# Patient Record
Sex: Male | Born: 1950 | Race: Asian | Hispanic: No | Marital: Married | Smoking: Former smoker
Health system: Southern US, Community
[De-identification: ages and names within clinical notes are randomized; demographics above are authoritative.]

## PROBLEM LIST (undated history)

## (undated) DIAGNOSIS — K219 Gastro-esophageal reflux disease without esophagitis: Secondary | ICD-10-CM

## (undated) DIAGNOSIS — L270 Generalized skin eruption due to drugs and medicaments taken internally: Secondary | ICD-10-CM

## (undated) DIAGNOSIS — K449 Diaphragmatic hernia without obstruction or gangrene: Secondary | ICD-10-CM

## (undated) DIAGNOSIS — D649 Anemia, unspecified: Secondary | ICD-10-CM

## (undated) DIAGNOSIS — B589 Toxoplasmosis, unspecified: Secondary | ICD-10-CM

## (undated) DIAGNOSIS — D7212 Drug rash with eosinophilia and systemic symptoms syndrome: Secondary | ICD-10-CM

## (undated) DIAGNOSIS — I5022 Chronic systolic (congestive) heart failure: Secondary | ICD-10-CM

## (undated) DIAGNOSIS — I2510 Atherosclerotic heart disease of native coronary artery without angina pectoris: Secondary | ICD-10-CM

## (undated) DIAGNOSIS — I10 Essential (primary) hypertension: Secondary | ICD-10-CM

## (undated) DIAGNOSIS — D721 Eosinophilia: Secondary | ICD-10-CM

## (undated) DIAGNOSIS — T50905A Adverse effect of unspecified drugs, medicaments and biological substances, initial encounter: Secondary | ICD-10-CM

## (undated) DIAGNOSIS — R911 Solitary pulmonary nodule: Secondary | ICD-10-CM

## (undated) HISTORY — DX: Adverse effect of unspecified drugs, medicaments and biological substances, initial encounter: T50.905A

## (undated) HISTORY — DX: Anemia, unspecified: D64.9

## (undated) HISTORY — DX: Eosinophilia: D72.1

## (undated) HISTORY — DX: Diaphragmatic hernia without obstruction or gangrene: K44.9

## (undated) HISTORY — DX: Drug rash with eosinophilia and systemic symptoms syndrome: D72.12

## (undated) HISTORY — DX: Generalized skin eruption due to drugs and medicaments taken internally: L27.0

## (undated) HISTORY — DX: Gastro-esophageal reflux disease without esophagitis: K21.9

## (undated) HISTORY — DX: Toxoplasmosis, unspecified: B58.9

## (undated) HISTORY — DX: Essential (primary) hypertension: I10

## (undated) HISTORY — PX: NO PAST SURGERIES: SHX2092

## (undated) HISTORY — DX: Solitary pulmonary nodule: R91.1

---

## 2011-11-21 ENCOUNTER — Ambulatory Visit (INDEPENDENT_AMBULATORY_CARE_PROVIDER_SITE_OTHER): Payer: BC Managed Care – PPO

## 2011-11-21 DIAGNOSIS — Z0000 Encounter for general adult medical examination without abnormal findings: Secondary | ICD-10-CM

## 2014-10-29 ENCOUNTER — Other Ambulatory Visit: Payer: Self-pay | Admitting: Family Medicine

## 2014-10-29 ENCOUNTER — Ambulatory Visit (INDEPENDENT_AMBULATORY_CARE_PROVIDER_SITE_OTHER): Payer: 59 | Admitting: Family Medicine

## 2014-10-29 VITALS — BP 138/84 | HR 66 | Temp 97.8°F | Resp 18 | Ht 67.0 in | Wt 204.4 lb

## 2014-10-29 DIAGNOSIS — Z13220 Encounter for screening for lipoid disorders: Secondary | ICD-10-CM

## 2014-10-29 DIAGNOSIS — Z125 Encounter for screening for malignant neoplasm of prostate: Secondary | ICD-10-CM

## 2014-10-29 DIAGNOSIS — Z23 Encounter for immunization: Secondary | ICD-10-CM

## 2014-10-29 DIAGNOSIS — Z0189 Encounter for other specified special examinations: Secondary | ICD-10-CM

## 2014-10-29 DIAGNOSIS — Z130 Encounter for screening for diseases of the blood and blood-forming organs and certain disorders involving the immune mechanism: Secondary | ICD-10-CM

## 2014-10-29 DIAGNOSIS — Z131 Encounter for screening for diabetes mellitus: Secondary | ICD-10-CM

## 2014-10-29 DIAGNOSIS — D509 Iron deficiency anemia, unspecified: Secondary | ICD-10-CM

## 2014-10-29 NOTE — Patient Instructions (Signed)
I will be in touch with your labs asap- I will send you a letter You got a flu shot and tetanus shot today Please see us for a complete physical sometime this year, and please schedule a colonoscopy with Dr. Marina GoodellPerry when you can  Good to meet you today!

## 2014-10-29 NOTE — Progress Notes (Signed)
[facility] [address], [place] [address] [phone]  Date:  10/29/2014   Name:  Martin JulianGerald Weinhold   DOB:  13-Feb-1951   MRN:  [id number]  PCP:  No primary care provider on file.    Chief Complaint: Establish Care   History of Present Illness:  Martin Mcdowell is a 64 y.o. very pleasant male patient who presents with the following:  He is here to establish care today.  He is generally in good health He has not yet had a colonoscopy.  His wife is established with Dr. Gwyneth SproutPerrry No recent BW, he needs a flu shot also He is not fasting today  There are no active problems to display for this patient.   History reviewed. No pertinent past medical history.  History reviewed. No pertinent past surgical history.  History  Substance Use Topics  . Smoking status: Never Smoker   . Smokeless tobacco: Not on file  . Alcohol Use: 1.2 - 1.8 oz/week    2-3 Not specified per week    History reviewed. No pertinent family history.  No Known Allergies  Medication list has been reviewed and updated.  No current outpatient prescriptions on file prior to visit.   No current facility-administered medications on file prior to visit.    Review of Systems:  As per HPI- otherwise negative.   Physical Examination: Filed Vitals:   10/29/14 1140  BP: 138/84  Pulse: 66  Temp: 97.8 F (36.6 C)  Resp: 18   Filed Vitals:   10/29/14 1140  Height: 5\' 7"  (1.702 m)  Weight: 204 lb 6.4 oz (92.715 kg)   Body mass index is 32.01 kg/(m^2). Ideal Body Weight: Weight in (lb) to have BMI = 25: 159.3  GEN: WDWN, NAD, Non-toxic, A & O x 3, overweight/ muscular build.  Looks well HEENT: Atraumatic, Normocephalic. Neck supple. No masses, No LAD. Ears and Nose: No external deformity. CV: RRR, No M/G/R. No JVD. No thrill. No extra heart sounds. PULM: CTA B, no wheezes, crackles, rhonchi. No retractions. No resp. distress. No accessory muscle use. EXTR: No c/c/e NEURO Normal  gait.  PSYCH: Normally interactive. Conversant. Not depressed or anxious appearing.  Calm demeanor.    Assessment and Plan: Screening for hyperlipidemia - Plan: Lipid panel  Immunization due - Plan: Flu Vaccine QUAD 36+ mos IM, Tdap vaccine greater than or equal to 7yo IM  Screening for prostate cancer - Plan: PSA  Screening for deficiency anemia - Plan: CBC  Screening for diabetes mellitus - Plan: Comprehensive metabolic panel  Screening labs and flu/ tdap as above.   Will be in touch with him pending his lab results.    Signed Abbe AmsterdamJessica Copland, MD

## 2014-10-30 LAB — LIPID PANEL
Cholesterol: 219 mg/dL — ABNORMAL HIGH (ref 0–200)
HDL: 38 mg/dL — ABNORMAL LOW (ref >39)
LDL Cholesterol: 158 mg/dL — ABNORMAL HIGH (ref 0–99)
Total CHOL/HDL Ratio: 5.8 Ratio
Triglycerides: 117 mg/dL (ref <150)
VLDL: 23 mg/dL (ref 0–40)

## 2014-10-30 LAB — COMPREHENSIVE METABOLIC PANEL
ALT: 24 U/L (ref 0–53)
AST: 19 U/L (ref 0–37)
Albumin: 3.9 g/dL (ref 3.5–5.2)
Alkaline Phosphatase: 54 U/L (ref 39–117)
BUN: 19 mg/dL (ref 6–23)
CO2: 27 mEq/L (ref 19–32)
Calcium: 9 mg/dL (ref 8.4–10.5)
Chloride: 109 mEq/L (ref 96–112)
Creat: 0.79 mg/dL (ref 0.50–1.35)
Glucose, Bld: 96 mg/dL (ref 70–99)
Potassium: 4.7 mEq/L (ref 3.5–5.3)
Sodium: 141 mEq/L (ref 135–145)
Total Bilirubin: 0.9 mg/dL (ref 0.2–1.2)
Total Protein: 6.7 g/dL (ref 6.0–8.3)

## 2014-10-30 LAB — CBC
HCT: 38.5 % — ABNORMAL LOW (ref 39.0–52.0)
Hemoglobin: 12.3 g/dL — ABNORMAL LOW (ref 13.0–17.0)
MCH: 20.3 pg — ABNORMAL LOW (ref 26.0–34.0)
MCHC: 31.9 g/dL (ref 30.0–36.0)
MCV: 63.5 fL — ABNORMAL LOW (ref 78.0–100.0)
MPV: 9.1 fL (ref 8.6–12.4)
Platelets: 279 10*3/uL (ref 150–400)
RBC: 6.06 MIL/uL — ABNORMAL HIGH (ref 4.22–5.81)
RDW: 17.1 % — ABNORMAL HIGH (ref 11.5–15.5)
WBC: 6.9 10*3/uL (ref 4.0–10.5)

## 2014-10-30 LAB — PSA: PSA: 1.18 ng/mL (ref <=4.00)

## 2014-11-02 ENCOUNTER — Encounter: Payer: Self-pay | Admitting: Family Medicine

## 2014-11-03 ENCOUNTER — Encounter: Payer: Self-pay | Admitting: Family Medicine

## 2014-11-03 LAB — FERRITIN: Ferritin: 124 ng/mL (ref 22–322)

## 2016-10-10 ENCOUNTER — Ambulatory Visit (INDEPENDENT_AMBULATORY_CARE_PROVIDER_SITE_OTHER): Payer: Self-pay | Admitting: Physician Assistant

## 2016-10-10 VITALS — BP 136/88 | HR 74 | Temp 97.9°F | Resp 16 | Ht 67.0 in | Wt 199.0 lb

## 2016-10-10 DIAGNOSIS — Z0289 Encounter for other administrative examinations: Secondary | ICD-10-CM

## 2016-10-10 NOTE — Progress Notes (Signed)
[facility] [address], [place] [address] [phone]  Date:  10/10/2016   Name:  Martin Mcdowell   DOB:  [birth year]   MRN:  [id number]  PCP:  No primary care provider on file.    History of Present Illness:  Martin Mcdowell is a 65 y.o. male patient [facility]. No concerns at this time. Ros listed below.  Vitamins without any prescription medications.      There are no active problems to display for this patient.   No past medical history on file.  No past surgical history on file.  Social History  Substance Use Topics  . Smoking status: Never Smoker  . Smokeless tobacco: Never Used  . Alcohol use 1.2 - 1.8 oz/week    2 - 3 Standard drinks or equivalent per week    No family history on file.  No Known Allergies  Medication list has been reviewed and updated.  No current outpatient prescriptions on file prior to visit.   No current facility-administered medications on file prior to visit.     Review of Systems  Constitutional: Negative for chills and fever.  HENT: Negative for ear discharge, ear pain and sore throat.   Eyes: Negative for blurred vision and double vision.  Respiratory: Negative for cough, shortness of breath and wheezing.   Cardiovascular: Negative for chest pain, palpitations and leg swelling.  Gastrointestinal: Negative for diarrhea, nausea and vomiting.  Genitourinary: Negative for dysuria, frequency and hematuria.  Skin: Negative for itching and rash.  Neurological: Negative for dizziness and headaches.   Physical Examination: BP 136/88 (BP Location: Left Arm, Patient Position: Sitting, Cuff Size: Normal)   Pulse 74   Temp 97.9 F (36.6 C) (Oral)   Resp 16   Ht 5\' 7"  (1.702 m)   Wt 199 lb (90.3 kg)   SpO2 97%   BMI 31.17 kg/m  Ideal Body Weight: Weight in (lb) to have BMI = 25: 159.3  Physical Exam  Constitutional: He is oriented to person, place, and time. He appears well-developed and  well-nourished. No distress.  HENT:  Head: Normocephalic and atraumatic.  Right Ear: Tympanic membrane, external ear and ear canal normal.  Left Ear: Tympanic membrane, external ear and ear canal normal.  Eyes: Conjunctivae and EOM are normal. Pupils are equal, round, and reactive to light.  Cardiovascular: Normal rate and regular rhythm.  Exam reveals no friction rub.   No murmur heard. Pulmonary/Chest: Effort normal. No respiratory distress. He has no wheezes.  Abdominal: Soft. Bowel sounds are normal. He exhibits no distension and no mass. There is no tenderness. Hernia confirmed negative in the right inguinal area and confirmed negative in the left inguinal area.  Musculoskeletal: Normal range of motion. He exhibits no edema or tenderness.  Neurological: He is alert and oriented to person, place, and time. He displays normal reflexes.  Skin: Skin is warm and dry. He is not diaphoretic.  Psychiatric: He has a normal mood and affect. His behavior is normal.     Assessment and Plan: Martin Mcdowell is a 65 y.o. male who is here today for dot exam. Normal without concerns.  2 year certification given. 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

## 2018-08-03 ENCOUNTER — Ambulatory Visit: Payer: Self-pay | Admitting: *Deleted

## 2018-08-03 NOTE — Telephone Encounter (Signed)
Patient's wife is calling to report that her husband has been having chest pain for over a month- the chest pain comes and goes. 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 Per protocol- advised ED for symptoms- but patient declines ED visit and wants to schedule appointment when he returns to town. Patient has NP appt at [facility] office- but needs sooner appointment- NP appointment moved( per [place]- they can not see him acute or move appointment up)to [place]. Strong precautions given to patient and wife if patient symptoms should get worse.   Reason for Disposition . [1] Chest pain lasts > 5 minutes AND [2] age > 17  Answer Assessment - Initial Assessment Questions 1. LOCATION: "Where does it hurt?"       To the left of strunum 2. RADIATION: "Does the pain go anywhere else?" (e.g., into neck, jaw, arms, back)     Patient has pains in all parts-no- not at this time 3. ONSET: "When did the chest pain begin?" (Minutes, hours or days)      1 hour ago 4. PATTERN "Does the pain come and go, or has it been constant since it started?"  "Does it get worse with exertion?"      Constant--subsided now, patient has never noticed it 5. DURATION: "How long does it last" (e.g., seconds, minutes, hours)     1 hour 6. SEVERITY: "How bad is the pain?"  (e.g., Scale 1-10; mild, moderate, or severe)    - MILD (1-3): doesn't interfere with normal activities     - MODERATE (4-7): interferes with normal activities or awakens from sleep    - SEVERE (8-10): excruciating pain, unable to do any normal activities       3-4 7. CARDIAC RISK FACTORS: "Do you have any history of heart problems or risk factors for heart disease?" (e.g., prior heart attack, angina; high blood pressure, diabetes, being overweight, high cholesterol, smoking, or strong family history of heart disease)     No- not as far as he knows 8. PULMONARY RISK FACTORS: "Do you have any history of lung disease?"   (e.g., blood clots in lung, asthma, emphysema, birth control pills)     no 9. CAUSE: "What do you think is causing the chest pain?"     No idea 10. OTHER SYMPTOMS: "Do you have any other symptoms?" (e.g., dizziness, nausea, vomiting, sweating, fever, difficulty breathing, cough)       Sweating, fever  11. PREGNANCY: "Is there any chance you are pregnant?" "When was your last menstrual period?"       n/a  Protocols used: CHEST PAIN-A-AH

## 2018-08-07 ENCOUNTER — Ambulatory Visit (INDEPENDENT_AMBULATORY_CARE_PROVIDER_SITE_OTHER): Payer: Medicare Other

## 2018-08-07 ENCOUNTER — Encounter: Payer: Self-pay | Admitting: Family Medicine

## 2018-08-07 ENCOUNTER — Ambulatory Visit (INDEPENDENT_AMBULATORY_CARE_PROVIDER_SITE_OTHER): Payer: Medicare Other | Admitting: Family Medicine

## 2018-08-07 VITALS — BP 126/70 | HR 79 | Temp 98.2°F | Ht 67.0 in | Wt 185.4 lb

## 2018-08-07 DIAGNOSIS — R05 Cough: Secondary | ICD-10-CM

## 2018-08-07 DIAGNOSIS — R059 Cough, unspecified: Secondary | ICD-10-CM

## 2018-08-07 DIAGNOSIS — R0789 Other chest pain: Secondary | ICD-10-CM

## 2018-08-07 DIAGNOSIS — R5383 Other fatigue: Secondary | ICD-10-CM

## 2018-08-07 DIAGNOSIS — Z23 Encounter for immunization: Secondary | ICD-10-CM

## 2018-08-07 DIAGNOSIS — Z1211 Encounter for screening for malignant neoplasm of colon: Secondary | ICD-10-CM

## 2018-08-07 DIAGNOSIS — N521 Erectile dysfunction due to diseases classified elsewhere: Secondary | ICD-10-CM

## 2018-08-07 LAB — LIPID PANEL
Cholesterol: 193 mg/dL (ref 0–200)
HDL: 22.6 mg/dL — ABNORMAL LOW (ref >39.00)
LDL Cholesterol: 141 mg/dL — ABNORMAL HIGH (ref 0–99)
NonHDL: 170.13
Total CHOL/HDL Ratio: 9
Triglycerides: 148 mg/dL (ref 0.0–149.0)
VLDL: 29.6 mg/dL (ref 0.0–40.0)

## 2018-08-07 LAB — CBC
HCT: 37.1 % — ABNORMAL LOW (ref 39.0–52.0)
Hemoglobin: 11.7 g/dL — ABNORMAL LOW (ref 13.0–17.0)
MCHC: 31.6 g/dL (ref 30.0–36.0)
MCV: 60.4 fl — ABNORMAL LOW (ref 78.0–100.0)
Platelets: 293 10*3/uL (ref 150.0–400.0)
RBC: 6.12 Mil/uL — ABNORMAL HIGH (ref 4.22–5.81)
RDW: 15.4 % (ref 11.5–15.5)
WBC: 5.5 10*3/uL (ref 4.0–10.5)

## 2018-08-07 LAB — COMPREHENSIVE METABOLIC PANEL
ALT: 25 U/L (ref 0–53)
AST: 22 U/L (ref 0–37)
Albumin: 3.4 g/dL — ABNORMAL LOW (ref 3.5–5.2)
Alkaline Phosphatase: 51 U/L (ref 39–117)
BUN: 16 mg/dL (ref 6–23)
CO2: 28 mEq/L (ref 19–32)
Calcium: 8.6 mg/dL (ref 8.4–10.5)
Chloride: 102 mEq/L (ref 96–112)
Creatinine, Ser: 0.93 mg/dL (ref 0.40–1.50)
GFR: 86.45 mL/min (ref >60.00)
Glucose, Bld: 96 mg/dL (ref 70–99)
Potassium: 4.3 mEq/L (ref 3.5–5.1)
Sodium: 136 mEq/L (ref 135–145)
Total Bilirubin: 0.7 mg/dL (ref 0.2–1.2)
Total Protein: 6.3 g/dL (ref 6.0–8.3)

## 2018-08-07 LAB — TSH: TSH: 1.85 u[IU]/mL (ref 0.35–4.50)

## 2018-08-07 LAB — TESTOSTERONE: Testosterone: 157.13 ng/dL — ABNORMAL LOW (ref 300.00–890.00)

## 2018-08-07 NOTE — Assessment & Plan Note (Signed)
Orders Placed This Encounter  Procedures  . DG Chest 2 View    Standing Status:   Future    Number of Occurrences:   1    Standing Expiration Date:   10/08/2019    Order Specific Question:   Reason for Exam (SYMPTOM  OR DIAGNOSIS REQUIRED)    Answer:   cough, fatigue, remote smoking history    Order Specific Question:   Preferred imaging location?    Answer:   Internal    Order Specific Question:   Radiology Contrast Protocol - do NOT remove file path    Answer:   _0 [email or web address]\DXFluoroContrastProtocols.pdf  . Pneumococcal conjugate vaccine 13-valent  . Flu vaccine HIGH DOSE PF (Fluzone High dose)  . Comp Met (CMET)  . CBC  . Lipid Profile  . TSH  . Cologuard  . Testosterone  . EKG 12-Lead   Labs ordered today including testosterone given decreased libido.

## 2018-08-07 NOTE — Assessment & Plan Note (Signed)
-  Discussed options for colon cancer screening. -He elects to have cologuard, ordered.

## 2018-08-07 NOTE — Progress Notes (Signed)
Martin Mcdowell - 67 y.o. male MRN [id number]  Date of birth: Jun 30, 1951  Subjective Chief Complaint  Patient presents with  . Chest Pain  . Gastroesophageal Reflux  . Night Sweats    HPI Martin Mcdowell is a 67 y.o. with no significant past medical problems here today to establish care and has concern about intermittent chest pain.   -Chest pain:  Reports that for the past 4-6 months he has had intermittent episodes of central chest discomfort.  Describes as someone "poking their finger in my chest".  Sensation lasts for maybe a couple of minutes.  Denies association with exertion.  This is not associated with any other symptoms including shortness of breath, sweating, nausea or vomiting, dizziness or palpitations.  He does admit to some occasional GERD, worsened with EtOH or certain foods.  Reports chronic swelling in L leg since he was a kid, no worsening of this.  He also has a remote history of smoking, quit in 1990.  Wife reports chronic cough. Had what sounds like viral illness last week with headache, fever/sweats and myalgias which has resolved at this point.   -Fatigue:  Reports increased fatigue as well as decrease in libido.  Wife also reports about 20lb weight loss over the past several months.  He states that they did just purchase a new home and have been doing some increased yard work around there and they have made significant changes to their diet as well.  He is not up to date on colon cancer screening.   ROS:  A comprehensive ROS was completed and negative except as noted per HPI  No Known Allergies  History reviewed. No pertinent past medical history.  History reviewed. No pertinent surgical history.  Social History   Socioeconomic History  . Marital status: Married    Spouse name: Not on file  . Number of children: Not on file  . Years of education: Not on file  . Highest education level: Not on file  Occupational History  . Not on file  Social Needs  . Financial  resource strain: Not on file  . Food insecurity:    Worry: Not on file    Inability: Not on file  . Transportation needs:    Medical: Not on file    Non-medical: Not on file  Tobacco Use  . Smoking status: Former Smoker    Types: Cigarettes  . Smokeless tobacco: Never Used  Substance and Sexual Activity  . Alcohol use: Yes    Alcohol/week: 2.0 - 3.0 standard drinks    Types: 2 - 3 Standard drinks or equivalent per week  . Drug use: No  . Sexual activity: Not on file  Lifestyle  . Physical activity:    Days per week: Not on file    Minutes per session: Not on file  . Stress: Not on file  Relationships  . Social connections:    Talks on phone: Not on file    Gets together: Not on file    Attends religious service: Not on file    Active member of club or organization: Not on file    Attends meetings of clubs or organizations: Not on file    Relationship status: Not on file  Other Topics Concern  . Not on file  Social History Narrative  . Not on file    History reviewed. No pertinent family history.  Health Maintenance  Topic Date Due  . COLONOSCOPY  10/13/2002  . PNA vac Low Risk Adult (  1 of 2 - PCV13) 10/13/2017  . INFLUENZA VACCINE  05/24/2018  . Hepatitis C Screening  08/08/2019 (Originally 05-Jul-1951)  . HIV Screening  08/08/2019 (Originally 10/14/1967)  . TETANUS/TDAP  10/29/2024    ----------------------------------------------------------------------------------------------------------------------------------------------------------------------------------------------------------------- Physical Exam BP 126/70   Pulse 79   Temp 98.2 F (36.8 C) (Oral)   Ht _0  (1.702 m)   Wt 185 lb 6.4 oz (84.1 kg)   SpO2 98%   BMI 29.04 kg/m   Physical Exam  Constitutional: He is oriented to person, place, and time. He appears well-nourished. No distress.  HENT:  Head: Normocephalic and atraumatic.  Mouth/Throat: Oropharynx is clear and moist.  Eyes: No scleral  icterus.  Neck: Neck supple. No thyromegaly present.  Cardiovascular: Normal rate, regular rhythm and normal heart sounds.  Pulmonary/Chest: Effort normal and breath sounds normal.  Abdominal: Soft. Bowel sounds are normal. He exhibits no distension. There is no tenderness.  Musculoskeletal: He exhibits edema (1+edema LLE).  Lymphadenopathy:    He has no cervical adenopathy.  Neurological: He is alert and oriented to person, place, and time.  Skin: Skin is warm and dry. No rash noted.  Psychiatric: He has a normal mood and affect. His behavior is normal.    ------------------------------------------------------------------------------------------------------------------------------------------------------------------------------------------------------------------- Assessment and Plan  Screening for colon cancer -Discussed options for colon cancer screening. -He elects to have cologuard, ordered.   Fatigue Orders Placed This Encounter  Procedures  . DG Chest 2 View    Standing Status:   Future    Number of Occurrences:   1    Standing Expiration Date:   10/08/2019    Order Specific Question:   Reason for Exam (SYMPTOM  OR DIAGNOSIS REQUIRED)    Answer:   cough, fatigue, remote smoking history    Order Specific Question:   Preferred imaging location?    Answer:   Internal    Order Specific Question:   Radiology Contrast Protocol - do NOT remove file path    Answer:   \\[email or web address]\Radiant\DXFluoroContrastProtocols.pdf  . Pneumococcal conjugate vaccine 13-valent  . Flu vaccine HIGH DOSE PF (Fluzone High dose)  . Comp Met (CMET)  . CBC  . Lipid Profile  . TSH  . Cologuard  . Testosterone  . EKG 12-Lead   Labs ordered today including testosterone given decreased libido.    Cough -CXR ordered for chronic cough, fatigue and weight loss.    Chest discomfort EKG completed and normal May be related to GERD or MSK.  -Discussed if occurs more frequently to let me  know. -Check labs to identify risk factors for CVD.

## 2018-08-07 NOTE — Patient Instructions (Addendum)
-  EKG looks ok.  If you have more frequent episodes of chest pain please let me know.  -Keep an eye out for cologuard kit -Shingles vaccine can be obtained at your pharmacy.  -We'll be in touch with lab results.

## 2018-08-07 NOTE — Assessment & Plan Note (Signed)
EKG completed and normal May be related to GERD or MSK.  -Discussed if occurs more frequently to let me know. -Check labs to identify risk factors for CVD.

## 2018-08-07 NOTE — Assessment & Plan Note (Signed)
-  CXR ordered for chronic cough, fatigue and weight loss.

## 2018-08-09 DIAGNOSIS — Z1211 Encounter for screening for malignant neoplasm of colon: Secondary | ICD-10-CM | POA: Diagnosis not present

## 2018-08-09 DIAGNOSIS — Z1212 Encounter for screening for malignant neoplasm of rectum: Secondary | ICD-10-CM | POA: Diagnosis not present

## 2018-08-09 LAB — COLOGUARD: Cologuard: NEGATIVE

## 2018-08-13 ENCOUNTER — Other Ambulatory Visit: Payer: Self-pay | Admitting: Family Medicine

## 2018-08-13 DIAGNOSIS — R351 Nocturia: Secondary | ICD-10-CM

## 2018-08-13 DIAGNOSIS — R05 Cough: Secondary | ICD-10-CM

## 2018-08-13 DIAGNOSIS — R059 Cough, unspecified: Secondary | ICD-10-CM

## 2018-08-13 DIAGNOSIS — N529 Male erectile dysfunction, unspecified: Secondary | ICD-10-CM

## 2018-08-13 DIAGNOSIS — R6882 Decreased libido: Secondary | ICD-10-CM

## 2018-08-13 DIAGNOSIS — Z125 Encounter for screening for malignant neoplasm of prostate: Secondary | ICD-10-CM

## 2018-08-13 MED ORDER — ATORVASTATIN CALCIUM 20 MG PO TABS: 20.0000 mg | ORAL_TABLET | Freq: Every day | ORAL | 0 refills | Status: DC

## 2018-08-13 NOTE — Addendum Note (Signed)
Addended by: Dominic Pea on: 08/13/2018 09:51 AM   Modules accepted: Orders

## 2018-08-13 NOTE — Progress Notes (Signed)
-  Cholesterol is elevated with increased risk of cardiovascular disease.  I would recommend medication (atorvastatin 20mg ) to lower this in combination with low fat diet and regular exercise.  -Testosterone levels are low, standard of care would be to repeat levels while fasting between 8-10am to confirm.

## 2018-08-13 NOTE — Progress Notes (Signed)
Also Chest xray shows area that appears to be a nipple shadow vs nodule.  Recommend repeating this when he comes back in for testosterone recheck.

## 2018-08-15 ENCOUNTER — Encounter: Payer: Self-pay | Admitting: Family Medicine

## 2018-08-16 ENCOUNTER — Encounter: Payer: Self-pay | Admitting: Family Medicine

## 2018-09-18 ENCOUNTER — Ambulatory Visit (INDEPENDENT_AMBULATORY_CARE_PROVIDER_SITE_OTHER): Payer: Medicare Other | Admitting: Family Medicine

## 2018-09-18 ENCOUNTER — Encounter: Payer: Self-pay | Admitting: Family Medicine

## 2018-09-18 ENCOUNTER — Ambulatory Visit (INDEPENDENT_AMBULATORY_CARE_PROVIDER_SITE_OTHER): Payer: Medicare Other

## 2018-09-18 VITALS — BP 140/90 | HR 78 | Temp 97.9°F | Wt 178.8 lb

## 2018-09-18 DIAGNOSIS — R1013 Epigastric pain: Secondary | ICD-10-CM | POA: Insufficient documentation

## 2018-09-18 DIAGNOSIS — R059 Cough, unspecified: Secondary | ICD-10-CM

## 2018-09-18 DIAGNOSIS — R6882 Decreased libido: Secondary | ICD-10-CM | POA: Diagnosis not present

## 2018-09-18 DIAGNOSIS — R918 Other nonspecific abnormal finding of lung field: Secondary | ICD-10-CM | POA: Diagnosis not present

## 2018-09-18 DIAGNOSIS — R05 Cough: Secondary | ICD-10-CM

## 2018-09-18 DIAGNOSIS — R351 Nocturia: Secondary | ICD-10-CM

## 2018-09-18 LAB — LIPASE: Lipase: 12 U/L (ref 11.0–59.0)

## 2018-09-18 LAB — TESTOSTERONE: Testosterone: 282.04 ng/dL — ABNORMAL LOW (ref 300.00–890.00)

## 2018-09-18 LAB — PSA: PSA: 1.37 ng/mL (ref 0.10–4.00)

## 2018-09-18 MED ORDER — PANTOPRAZOLE SODIUM 40 MG PO TBEC: DELAYED_RELEASE_TABLET | ORAL | 2 refills | Status: DC

## 2018-09-18 MED ORDER — SUCRALFATE 1 G PO TABS: 1.0000 g | ORAL_TABLET | Freq: Three times a day (TID) | ORAL | 1 refills | Status: DC

## 2018-09-18 NOTE — Patient Instructions (Signed)
Gastritis, Adult  Gastritis is swelling (inflammation) of the stomach. When you have this condition, you can have these problems (symptoms):  ? Pain in your stomach.  ? A burning feeling in your stomach.  ? Feeling sick to your stomach (nauseous).  ? Throwing up (vomiting).  ? Feeling too full after you eat.  It is important to get help for this condition. Without help, your stomach can bleed, and you can get sores (ulcers) in your stomach.  Follow these instructions at home:  ? Take over-the-counter and prescription medicines only as told by your doctor.  ? If you were prescribed an antibiotic medicine, take it as told by your doctor. Do not stop taking it even if you start to feel better.  ? Drink enough fluid to keep your pee (urine) clear or pale yellow.  ? Instead of eating big meals, eat small meals often.  Contact a health care provider if:  ? Your problems get worse.  ? Your problems go away and then come back.  Get help right away if:  ? You throw up blood or something that looks like coffee grounds.  ? You have black or dark red poop (stools).  ? You cannot keep fluids down.  ? Your stomach pain gets worse.  ? You have a fever.  ? You do not feel better after 1 week.  This information is not intended to replace advice given to you by your health care provider. Make sure you discuss any questions you have with your health care provider.  Document Released: 03/28/2008 Document Revised: 06/08/2016 Document Reviewed: 07/04/2015  Elsevier Interactive Patient Education ? 2018 Elsevier Inc[employer]

## 2018-09-18 NOTE — Progress Notes (Signed)
Martin Mcdowell - 67 y.o. male MRN [id number]  Date of birth: [birth year]  Subjective Chief Complaint  Patient presents with  . Follow-up    HPI Martin Mcdowell is a 67 y.o. male here today for follow up of decreased libido.  He also has complaint of epigastric pain.  He reports that he has recurrent episodes of epigastric pain.  Points to area of pain in midline abdomen just below xiphoid.  Describes as burning and aching pain.  He has had some nausea and vomiting with this and tends to feel better after vomiting.  He denies RUQ pain, changes to bowels.  He does admit to reflux symptoms.  He is not taking anything for treatment of reflux symptoms.  He denies dark stools or blood in vomit.  He denies excess NSAID use.    ROS:  A comprehensive ROS was completed and negative except as noted per HPI  No Known Allergies  Past Medical History:  Diagnosis Date  . GERD (gastroesophageal reflux disease)     No past surgical history on file.  Social History   Socioeconomic History  . Marital status: Married    Spouse name: Not on file  . Number of children: Not on file  . Years of education: Not on file  . Highest education level: Not on file  Occupational History  . Not on file  Social Needs  . Financial resource strain: Not on file  . Food insecurity:    Worry: Not on file    Inability: Not on file  . Transportation needs:    Medical: Not on file    Non-medical: Not on file  Tobacco Use  . Smoking status: Former Smoker    Types: Cigarettes  . Smokeless tobacco: Never Used  Substance and Sexual Activity  . Alcohol use: Yes    Alcohol/week: 2.0 - 3.0 standard drinks    Types: 2 - 3 Standard drinks or equivalent per week  . Drug use: No  . Sexual activity: Not on file  Lifestyle  . Physical activity:    Days per week: Not on file    Minutes per session: Not on file  . Stress: Not on file  Relationships  . Social connections:    Talks on phone: Not on file    Gets together: Not  on file    Attends religious service: Not on file    Active member of club or organization: Not on file    Attends meetings of clubs or organizations: Not on file    Relationship status: Not on file  Other Topics Concern  . Not on file  Social History Narrative  . Not on file    No family history on file.  Health Maintenance  Topic Date Due  . COLONOSCOPY  10/13/2002  . Hepatitis C Screening  08/08/2019 (Originally [birth year])  . HIV Screening  08/08/2019 (Originally 10/14/1967)  . PNA vac Low Risk Adult (2 of 2 - PPSV23) 08/08/2019  . TETANUS/TDAP  10/29/2024  . INFLUENZA VACCINE  Completed    ----------------------------------------------------------------------------------------------------------------------------------------------------------------------------------------------------------------- Physical Exam BP 140/90   Pulse 78   Temp 97.9 F (36.6 C) (Oral)   Wt 178 lb 12.8 oz (81.1 kg)   SpO2 98%   BMI 28.00 kg/m   Physical Exam  Constitutional: He is oriented to person, place, and time. He appears well-nourished. No distress.  HENT:  Head: Normocephalic and atraumatic.  Mouth/Throat: Oropharynx is clear and moist.  Eyes: No scleral icterus.  Neck: Neck supple.  No thyromegaly present.  Cardiovascular: Normal rate, regular rhythm and normal heart sounds.  Pulmonary/Chest: Effort normal and breath sounds normal.  Abdominal: Soft. Bowel sounds are normal. He exhibits no distension and no mass. There is tenderness (epigastric.  No RUQ ttp or murphy sign). There is no guarding.  Lymphadenopathy:    He has no cervical adenopathy.  Neurological: He is alert and oriented to person, place, and time.  Skin: Skin is warm and dry.  Psychiatric: He has a normal mood and affect. His behavior is normal.     ------------------------------------------------------------------------------------------------------------------------------------------------------------------------------------------------------------------- Assessment and Plan  Cough -Repeat CXR with nipple markers.   Decreased libido -Recheck testosterone levels.   Epigastric pain -History and exam consistent with gastritis, trial of PPI and carafate.  -Check lipase as well.  -If not improving we discussed obtaining abdominal US.

## 2018-09-18 NOTE — Assessment & Plan Note (Signed)
-  Recheck testosterone levels.

## 2018-09-18 NOTE — Assessment & Plan Note (Signed)
-  History and exam consistent with gastritis, trial of PPI and carafate.  -Check lipase as well.  -If not improving we discussed obtaining abdominal US.

## 2018-09-18 NOTE — Assessment & Plan Note (Signed)
Repeat CXR with nipple markers

## 2018-09-24 ENCOUNTER — Encounter: Payer: Self-pay | Admitting: Family Medicine

## 2018-09-26 ENCOUNTER — Other Ambulatory Visit: Payer: Self-pay | Admitting: Family Medicine

## 2018-09-26 DIAGNOSIS — B5801 Toxoplasma chorioretinitis: Secondary | ICD-10-CM | POA: Diagnosis not present

## 2018-09-26 DIAGNOSIS — H35371 Puckering of macula, right eye: Secondary | ICD-10-CM | POA: Diagnosis not present

## 2018-09-26 DIAGNOSIS — H3090 Unspecified chorioretinal inflammation, unspecified eye: Secondary | ICD-10-CM | POA: Diagnosis not present

## 2018-09-26 DIAGNOSIS — H35362 Drusen (degenerative) of macula, left eye: Secondary | ICD-10-CM | POA: Diagnosis not present

## 2018-09-26 DIAGNOSIS — H43811 Vitreous degeneration, right eye: Secondary | ICD-10-CM | POA: Diagnosis not present

## 2018-09-26 MED ORDER — SILDENAFIL CITRATE 100 MG PO TABS: 50.0000 mg | ORAL_TABLET | Freq: Every day | ORAL | 3 refills | Status: DC | PRN

## 2018-10-03 DIAGNOSIS — H35371 Puckering of macula, right eye: Secondary | ICD-10-CM | POA: Diagnosis not present

## 2018-10-03 DIAGNOSIS — B5801 Toxoplasma chorioretinitis: Secondary | ICD-10-CM | POA: Diagnosis not present

## 2018-10-03 DIAGNOSIS — H35362 Drusen (degenerative) of macula, left eye: Secondary | ICD-10-CM | POA: Diagnosis not present

## 2018-10-03 DIAGNOSIS — H43811 Vitreous degeneration, right eye: Secondary | ICD-10-CM | POA: Diagnosis not present

## 2018-10-26 DIAGNOSIS — B5801 Toxoplasma chorioretinitis: Secondary | ICD-10-CM | POA: Diagnosis not present

## 2018-10-26 DIAGNOSIS — H43811 Vitreous degeneration, right eye: Secondary | ICD-10-CM | POA: Diagnosis not present

## 2018-10-26 DIAGNOSIS — H35371 Puckering of macula, right eye: Secondary | ICD-10-CM | POA: Diagnosis not present

## 2018-10-26 DIAGNOSIS — H35362 Drusen (degenerative) of macula, left eye: Secondary | ICD-10-CM | POA: Diagnosis not present

## 2018-10-31 DIAGNOSIS — I440 Atrioventricular block, first degree: Secondary | ICD-10-CM | POA: Diagnosis not present

## 2018-10-31 DIAGNOSIS — R109 Unspecified abdominal pain: Secondary | ICD-10-CM | POA: Diagnosis not present

## 2018-10-31 DIAGNOSIS — R1013 Epigastric pain: Secondary | ICD-10-CM | POA: Diagnosis not present

## 2018-10-31 DIAGNOSIS — I493 Ventricular premature depolarization: Secondary | ICD-10-CM | POA: Diagnosis not present

## 2018-11-13 ENCOUNTER — Ambulatory Visit: Payer: Self-pay | Admitting: Family

## 2018-12-07 ENCOUNTER — Encounter: Payer: Self-pay | Admitting: Internal Medicine

## 2018-12-07 DIAGNOSIS — A419 Sepsis, unspecified organism: Secondary | ICD-10-CM | POA: Diagnosis not present

## 2018-12-07 DIAGNOSIS — R509 Fever, unspecified: Secondary | ICD-10-CM | POA: Diagnosis not present

## 2018-12-07 DIAGNOSIS — J189 Pneumonia, unspecified organism: Secondary | ICD-10-CM | POA: Diagnosis not present

## 2018-12-07 DIAGNOSIS — Z87891 Personal history of nicotine dependence: Secondary | ICD-10-CM | POA: Diagnosis not present

## 2018-12-07 DIAGNOSIS — J168 Pneumonia due to other specified infectious organisms: Secondary | ICD-10-CM | POA: Diagnosis not present

## 2018-12-08 ENCOUNTER — Encounter: Payer: Self-pay | Admitting: Family Medicine

## 2018-12-09 DIAGNOSIS — I77811 Abdominal aortic ectasia: Secondary | ICD-10-CM | POA: Diagnosis not present

## 2018-12-09 DIAGNOSIS — R079 Chest pain, unspecified: Secondary | ICD-10-CM | POA: Diagnosis not present

## 2018-12-09 DIAGNOSIS — R0602 Shortness of breath: Secondary | ICD-10-CM | POA: Diagnosis not present

## 2018-12-09 DIAGNOSIS — R000 Tachycardia, unspecified: Secondary | ICD-10-CM | POA: Diagnosis not present

## 2018-12-09 DIAGNOSIS — R9431 Abnormal electrocardiogram [ECG] [EKG]: Secondary | ICD-10-CM | POA: Diagnosis not present

## 2018-12-09 DIAGNOSIS — N400 Enlarged prostate without lower urinary tract symptoms: Secondary | ICD-10-CM | POA: Diagnosis not present

## 2018-12-09 DIAGNOSIS — R112 Nausea with vomiting, unspecified: Secondary | ICD-10-CM | POA: Diagnosis not present

## 2018-12-09 DIAGNOSIS — D72829 Elevated white blood cell count, unspecified: Secondary | ICD-10-CM | POA: Diagnosis not present

## 2018-12-09 DIAGNOSIS — R509 Fever, unspecified: Secondary | ICD-10-CM | POA: Diagnosis not present

## 2018-12-09 DIAGNOSIS — K8020 Calculus of gallbladder without cholecystitis without obstruction: Secondary | ICD-10-CM | POA: Diagnosis not present

## 2018-12-09 DIAGNOSIS — R197 Diarrhea, unspecified: Secondary | ICD-10-CM | POA: Diagnosis not present

## 2018-12-09 DIAGNOSIS — R066 Hiccough: Secondary | ICD-10-CM | POA: Diagnosis not present

## 2018-12-09 DIAGNOSIS — R591 Generalized enlarged lymph nodes: Secondary | ICD-10-CM | POA: Diagnosis not present

## 2018-12-09 DIAGNOSIS — R21 Rash and other nonspecific skin eruption: Secondary | ICD-10-CM | POA: Diagnosis not present

## 2018-12-10 ENCOUNTER — Emergency Department (HOSPITAL_COMMUNITY)
Admission: EM | Admit: 2018-12-10 | Discharge: 2018-12-11 | Disposition: A | Discharge status: Other Acute Inpatient Hospital | Payer: Medicare Other | Attending: Emergency Medicine | Admitting: Emergency Medicine

## 2018-12-10 ENCOUNTER — Ambulatory Visit: Payer: Self-pay | Admitting: *Deleted

## 2018-12-10 ENCOUNTER — Encounter (HOSPITAL_COMMUNITY): Payer: Self-pay

## 2018-12-10 ENCOUNTER — Emergency Department (HOSPITAL_COMMUNITY): Payer: Medicare Other

## 2018-12-10 DIAGNOSIS — R0602 Shortness of breath: Secondary | ICD-10-CM | POA: Diagnosis not present

## 2018-12-10 DIAGNOSIS — B589 Toxoplasmosis, unspecified: Secondary | ICD-10-CM | POA: Diagnosis not present

## 2018-12-10 DIAGNOSIS — R21 Rash and other nonspecific skin eruption: Secondary | ICD-10-CM | POA: Diagnosis not present

## 2018-12-10 DIAGNOSIS — R600 Localized edema: Secondary | ICD-10-CM | POA: Diagnosis not present

## 2018-12-10 DIAGNOSIS — R000 Tachycardia, unspecified: Secondary | ICD-10-CM | POA: Diagnosis not present

## 2018-12-10 DIAGNOSIS — R05 Cough: Secondary | ICD-10-CM | POA: Diagnosis not present

## 2018-12-10 DIAGNOSIS — Z87891 Personal history of nicotine dependence: Secondary | ICD-10-CM | POA: Insufficient documentation

## 2018-12-10 DIAGNOSIS — Z79899 Other long term (current) drug therapy: Secondary | ICD-10-CM | POA: Insufficient documentation

## 2018-12-10 DIAGNOSIS — A419 Sepsis, unspecified organism: Secondary | ICD-10-CM

## 2018-12-10 LAB — POCT I-STAT 7, (LYTES, BLD GAS, ICA,H+H)
Acid-base deficit: 5 mmol/L — ABNORMAL HIGH (ref 0.0–2.0)
Bicarbonate: 17.6 mmol/L — ABNORMAL LOW (ref 20.0–28.0)
Calcium, Ion: 1.13 mmol/L — ABNORMAL LOW (ref 1.15–1.40)
HCT: 39 % (ref 39.0–52.0)
Hemoglobin: 13.3 g/dL (ref 13.0–17.0)
O2 Saturation: 97 %
Patient temperature: 100
Potassium: 4.1 mmol/L (ref 3.5–5.1)
Sodium: 131 mmol/L — ABNORMAL LOW (ref 135–145)
TCO2: 18 mmol/L — ABNORMAL LOW (ref 22–32)
pCO2 arterial: 26.1 mmHg — ABNORMAL LOW (ref 32.0–48.0)
pH, Arterial: 7.439 (ref 7.350–7.450)
pO2, Arterial: 88 mmHg (ref 83.0–108.0)

## 2018-12-10 LAB — URINALYSIS, ROUTINE W REFLEX MICROSCOPIC
Bilirubin Urine: NEGATIVE
Glucose, UA: NEGATIVE mg/dL
Hgb urine dipstick: NEGATIVE
Ketones, ur: NEGATIVE mg/dL
Leukocytes,Ua: NEGATIVE
Nitrite: NEGATIVE
Protein, ur: NEGATIVE mg/dL
Specific Gravity, Urine: 1.03 (ref 1.005–1.030)
pH: 5 (ref 5.0–8.0)

## 2018-12-10 LAB — COMPREHENSIVE METABOLIC PANEL
ALT: 64 U/L — ABNORMAL HIGH (ref 0–44)
AST: 50 U/L — ABNORMAL HIGH (ref 15–41)
Albumin: 2.2 g/dL — ABNORMAL LOW (ref 3.5–5.0)
Alkaline Phosphatase: 248 U/L — ABNORMAL HIGH (ref 38–126)
Anion gap: 13 (ref 5–15)
BUN: 20 mg/dL (ref 8–23)
CO2: 17 mmol/L — ABNORMAL LOW (ref 22–32)
Calcium: 8 mg/dL — ABNORMAL LOW (ref 8.9–10.3)
Chloride: 101 mmol/L (ref 98–111)
Creatinine, Ser: 1.57 mg/dL — ABNORMAL HIGH (ref 0.61–1.24)
GFR calc Af Amer: 52 mL/min — ABNORMAL LOW (ref >60)
GFR calc non Af Amer: 45 mL/min — ABNORMAL LOW (ref >60)
Glucose, Bld: 131 mg/dL — ABNORMAL HIGH (ref 70–99)
Potassium: 4.5 mmol/L (ref 3.5–5.1)
Sodium: 131 mmol/L — ABNORMAL LOW (ref 135–145)
Total Bilirubin: 1.1 mg/dL (ref 0.3–1.2)
Total Protein: 5 g/dL — ABNORMAL LOW (ref 6.5–8.1)

## 2018-12-10 LAB — BRAIN NATRIURETIC PEPTIDE: B Natriuretic Peptide: 32.2 pg/mL (ref 0.0–100.0)

## 2018-12-10 LAB — CBC WITH DIFFERENTIAL/PLATELET
Abs Immature Granulocytes: 0 10*3/uL (ref 0.00–0.07)
Basophils Absolute: 0 10*3/uL (ref 0.0–0.1)
Basophils Relative: 0 %
Eosinophils Absolute: 9.1 10*3/uL — ABNORMAL HIGH (ref 0.0–0.5)
Eosinophils Relative: 27 %
HCT: 43.5 % (ref 39.0–52.0)
Hemoglobin: 13.5 g/dL (ref 13.0–17.0)
Lymphocytes Relative: 18 %
Lymphs Abs: 6.1 10*3/uL — ABNORMAL HIGH (ref 0.7–4.0)
MCH: 18.5 pg — ABNORMAL LOW (ref 26.0–34.0)
MCHC: 31 g/dL (ref 30.0–36.0)
MCV: 59.8 fL — ABNORMAL LOW (ref 80.0–100.0)
Monocytes Absolute: 1.3 10*3/uL — ABNORMAL HIGH (ref 0.1–1.0)
Monocytes Relative: 4 %
Neutro Abs: 17.2 10*3/uL — ABNORMAL HIGH (ref 1.7–7.7)
Neutrophils Relative %: 51 %
Platelets: 491 10*3/uL — ABNORMAL HIGH (ref 150–400)
RBC: 7.28 MIL/uL — ABNORMAL HIGH (ref 4.22–5.81)
RDW: 20.1 % — ABNORMAL HIGH (ref 11.5–15.5)
WBC: 33.7 10*3/uL — ABNORMAL HIGH (ref 4.0–10.5)
nRBC: 0 % (ref 0.0–0.2)
nRBC: 1 /100 WBC — ABNORMAL HIGH

## 2018-12-10 LAB — LACTIC ACID, PLASMA
Lactic Acid, Venous: 4.3 mmol/L (ref 0.5–1.9)
Lactic Acid, Venous: 2.1 mmol/L (ref 0.5–1.9)

## 2018-12-10 LAB — SEDIMENTATION RATE: Sed Rate: 1 mm/hr (ref 0–16)

## 2018-12-10 LAB — PROCALCITONIN: Procalcitonin: 0.41 ng/mL

## 2018-12-10 MED ORDER — SODIUM CHLORIDE 0.9 % IV BOLUS
1000.0000 mL | Freq: Once | INTRAVENOUS | Status: AC
  Administered 2018-12-10: 1000 mL via INTRAVENOUS

## 2018-12-10 MED ORDER — PIPERACILLIN-TAZOBACTAM 3.375 G IVPB 30 MIN
3.3750 g | Freq: Once | INTRAVENOUS | Status: AC
  Administered 2018-12-10: 3.375 g via INTRAVENOUS
  Filled 2018-12-10: qty 50

## 2018-12-10 MED ORDER — SODIUM CHLORIDE 0.9 % IV SOLN
1000.0000 mL | INTRAVENOUS | Status: DC
  Administered 2018-12-10: 1000 mL via INTRAVENOUS

## 2018-12-10 MED ORDER — VANCOMYCIN HCL IN DEXTROSE 1-5 GM/200ML-% IV SOLN
1000.0000 mg | Freq: Once | INTRAVENOUS | Status: AC
  Administered 2018-12-10: 1000 mg via INTRAVENOUS
  Filled 2018-12-10: qty 200

## 2018-12-10 MED ORDER — METHYLPREDNISOLONE SODIUM SUCC 125 MG IJ SOLR
125.0000 mg | Freq: Once | INTRAMUSCULAR | Status: AC
  Administered 2018-12-10: 125 mg via INTRAVENOUS
  Filled 2018-12-10: qty 2

## 2018-12-10 MED ORDER — METOPROLOL TARTRATE 5 MG/5ML IV SOLN
5.0000 mg | Freq: Once | INTRAVENOUS | Status: AC
  Administered 2018-12-10: 5 mg via INTRAVENOUS
  Filled 2018-12-10: qty 5

## 2018-12-10 NOTE — ED Notes (Signed)
Waiting for room number assigned @ [facility], accepting Dr Sheliah Plane

## 2018-12-10 NOTE — Telephone Encounter (Signed)
Pt's wife calling (on [place]). States [place] pt to ED out of state 11/06/2018 for abdominal pain, fever. See Mychart message from 12/08/2018. States hospital wanted to admit him but he wanted to return home. States she has all records. Calling to ask what hospital she should take him to, instructed to go to main campus [facility]. Pt and wife are currently in [place]. Attempted to instruct to go to nearest ED if symptoms worsen but call was dropped.  Assured [place] would alert Dr. Ashley Royalty.  Reason for Disposition . Health Information question, no triage required and triager able to answer question  Answer Assessment - Initial Assessment Questions 1. REASON FOR CALL or QUESTION: "What is your reason for calling today?" or "How can I best help you?" or "What question do you have that I can help answer?"     Advise  Protocols used: INFORMATION ONLY CALL-A-AH

## 2018-12-10 NOTE — ED Notes (Signed)
Report called to [facility] and given to [employer], [phone].  Patient going to [facility].

## 2018-12-10 NOTE — ED Notes (Signed)
E-Signature not available - Transfer consent signed and sent to medical records.

## 2018-12-10 NOTE — ED Provider Notes (Signed)
[facility] Provider Note   CSN: [id number] Arrival date & time: 12/10/18  1704    History   Chief Complaint Chief Complaint  Patient presents with  . SOB/ WBC 30000    HPI Martin Mcdowell is a 68 y.o. male.     HPI  The patient is an ill-appearing 68 year old male, he has had a very bizarre medical history over the last month where he was diagnosed with toxoplasmosis, he was treated with a medication that he cannot remember the name of but over the last couple of weeks after taking his antibiotics for that he developed a diffuse erythematous rash involving his palms, having some blistering and desquamating lesions on his skin which became very red and warm to the touch.  He is also had some progressive shortness of breath.  Over the course of the last week as he and his wife have been driving across country they stopped at multiple hospitals including [place], they did not stay for their entire results before they left and then went to [place] last night where they also left without being transferred to a higher level of care which was recommended per the patient's report.  Reportedly they found multiple abnormalities, his urinalysis was unremarkable, his lactic acid was 3.9 his white blood cell count was 30,000 and with that he had a leftward shift with high neutrophils but also high lymphocytes monocytes and eosinophils.  He was found to have some microcytosis and some teardrop cells.  Platelets were high at 534.  His metabolic panel showed an INR of 1.1 normal sodium potassium chloride and CO2 with an anion gap of 14 and a creatinine of 1.59.  He was normoglycemic.  Both calcium and magnesium were within the normal ranges.  While he was in the department he complained of hiccups which have been going on for a week and thus he underwent a CT angiogram, and said that no pulmonary embolism was identified, he had no evidence of dissection.  There were some  prominent lymph nodes.  There is no focal consolidation, effusion or pneumothorax.  There was a complex posterior inferior right lower lobe multicystic lesion measuring 6 x 4 x 8 cm that contains numerous cystic spaces.  CT abdomen and pelvis was also obtained, showing a distended extrahepatic duct measuring 8 mm.  There was dense material in the gallbladder, right groin lymph node measuring up to 1 cm, no other acute findings in the abdomen and pelvis.  Additionally the EKG performed at the outside hospital had a rate of 107 which appeared to be normal sinus rhythm without any significant findings.  Ultimately the patient left AGAINST MEDICAL ADVICE and decided to come back here where he currently lives.  He has persistent shortness of breath, complains of persistent rash involving his hands, they are concerned because of the lymphadenopathy and the elevated blood count that he may have a lymphoma or other cancer.  He is persistently tachycardic and ill-appearing.  Past Medical History:  Diagnosis Date  . GERD (gastroesophageal reflux disease)     Patient Active Problem List   Diagnosis Date Noted  . Epigastric pain 09/18/2018  . Decreased libido 09/18/2018  . Chest discomfort 08/07/2018  . Fatigue 08/07/2018  . Cough 08/07/2018  . Screening for colon cancer 08/07/2018    History reviewed. No pertinent surgical history.      Home Medications    Prior to Admission medications   Medication Sig Start Date End Date Taking?  Authorizing Provider  atorvastatin (LIPITOR) 20 MG tablet Take 1 tablet (20 mg total) by mouth daily. 08/13/18  Yes Luetta Nutting, DO  azithromycin (ZITHROMAX) 250 MG tablet Take 250-500 mg by mouth daily. Take 500 mg by mouth once daily on day 1, then decrease to 250 mg once daily on days 2-5 until finished 12/08/18 12/12/18 Yes [provider]  cefdinir (OMNICEF) 300 MG capsule Take 300 mg by mouth 2 (two) times daily. FOR 10 DAYS 12/08/18 12/17/18 Yes [provider]  ibuprofen (ADVIL,MOTRIN) 200 MG tablet Take 200-400 mg by mouth every 6 (six) hours as needed (for pain).   Yes [provider]  multivitamin (ONE-A-DAY MEN'S) TABS tablet Take 1 tablet by mouth daily.   Yes [provider]  pantoprazole (PROTONIX) 40 MG tablet Take 1 tab BID x2 weeks then daily Patient not taking: Reported on 12/10/2018 09/18/18   Luetta Nutting, DO  sildenafil (VIAGRA) 100 MG tablet Take 0.5-1 tablets (50-100 mg total) by mouth daily as needed (sexual activity). Patient not taking: Reported on 12/10/2018 09/26/18   Luetta Nutting, DO  sucralfate (CARAFATE) 1 g tablet Take 1 tablet (1 g total) by mouth 4 (four) times daily -  with meals and at bedtime. Patient not taking: Reported on 12/10/2018 09/18/18   Luetta Nutting, DO    Family History No family history on file.  Social History Social History   Tobacco Use  . Smoking status: Former Smoker    Types: Cigarettes  . Smokeless tobacco: Never Used  Substance Use Topics  . Alcohol use: Yes    Alcohol/week: 2.0 - 3.0 standard drinks    Types: 2 - 3 Standard drinks or equivalent per week  . Drug use: No     Allergies   Patient has no known allergies.   Review of Systems Review of Systems  All other systems reviewed and are negative.    Physical Exam Updated Vital Signs BP 94/61   Pulse 94   Temp 100 F (37.8 C) (Oral)   Resp 19   Ht 1.702 m (5\' 7" )   Wt 74.8 kg   SpO2 98%   BMI 25.84 kg/m   Physical Exam Vitals signs and nursing note reviewed.  Constitutional:      General: He is not in acute distress.    Appearance: He is well-developed.  HENT:     Head: Normocephalic and atraumatic.     Mouth/Throat:     Pharynx: No oropharyngeal exudate.  Eyes:     General: No scleral icterus.       Right eye: No discharge.        Left eye: No discharge.     Conjunctiva/sclera: Conjunctivae normal.     Pupils: Pupils are equal, round, and reactive to light.  Neck:      Musculoskeletal: Normal range of motion and neck supple.     Thyroid: No thyromegaly.     Vascular: No JVD.  Cardiovascular:     Rate and Rhythm: Regular rhythm. Tachycardia present.     Heart sounds: Normal heart sounds. No murmur. No friction rub. No gallop.   Pulmonary:     Effort: Respiratory distress present.     Breath sounds: Normal breath sounds. No wheezing or rales.  Abdominal:     General: Bowel sounds are normal. There is no distension.     Palpations: Abdomen is soft. There is no mass.     Tenderness: There is no abdominal tenderness.  Musculoskeletal: Normal range  of motion.        General: No tenderness.     Left lower leg: Edema present.     Comments: There is mild edema of the left lower extremity compared to the right, the patient states this is chronic and related to "lymphedema" which she has had since the 1990s   Lymphadenopathy:     Cervical: No cervical adenopathy.  Skin:    General: Skin is warm and dry.     Findings: Erythema and rash present.     Comments: The patient has a diffuse erythematous rash involving nearly his entire face except for the periorbital areas which are spared, spots on his neck and spots on his wrists which are spared however it does involve the palmar spaces.  There are no obvious vesicular lesions, it is blanching, there are no petechiae or purpura.  Neurological:     Mental Status: He is alert.     Coordination: Coordination normal.     Comments: Normal level of alertness speech, memory and coordination.  Psychiatric:        Behavior: Behavior normal.      ED Treatments / Results  Labs (all labs ordered are listed, but only abnormal results are displayed) Labs Reviewed  LACTIC ACID, PLASMA - Abnormal; Notable for the following components:      Result Value   Lactic Acid, Venous 4.3 (*)    All other components within normal limits  COMPREHENSIVE METABOLIC PANEL - Abnormal; Notable for the following components:   Sodium 131  (*)    CO2 17 (*)    Glucose, Bld 131 (*)    Creatinine, Ser 1.57 (*)    Calcium 8.0 (*)    Total Protein 5.0 (*)    Albumin 2.2 (*)    AST 50 (*)    ALT 64 (*)    Alkaline Phosphatase 248 (*)    GFR calc non Af Amer 45 (*)    GFR calc Af Amer 52 (*)    All other components within normal limits  CBC WITH DIFFERENTIAL/PLATELET - Abnormal; Notable for the following components:   WBC 33.7 (*)    RBC 7.28 (*)    MCV 59.8 (*)    MCH 18.5 (*)    RDW 20.1 (*)    Platelets 491 (*)    Neutro Abs 17.2 (*)    Lymphs Abs 6.1 (*)    Monocytes Absolute 1.3 (*)    Eosinophils Absolute 9.1 (*)    nRBC 1 (*)    All other components within normal limits  POCT I-STAT 7, (LYTES, BLD GAS, ICA,H+H) - Abnormal; Notable for the following components:   pCO2 arterial 26.1 (*)    Bicarbonate 17.6 (*)    TCO2 18 (*)    Acid-base deficit 5.0 (*)    Sodium 131 (*)    Calcium, Ion 1.13 (*)    All other components within normal limits  CULTURE, BLOOD (ROUTINE X 2)  CULTURE, BLOOD (ROUTINE X 2)  URINE CULTURE  PROCALCITONIN  SEDIMENTATION RATE  LACTIC ACID, PLASMA  URINALYSIS, ROUTINE W REFLEX MICROSCOPIC  BLOOD GAS, ARTERIAL  BRAIN NATRIURETIC PEPTIDE    EKG EKG Interpretation  Date/Time:  Monday December 10 2018 17:08:35 EST Ventricular Rate:  135 PR Interval:  152 QRS Duration: 64 QT Interval:  280 QTC Calculation: 420 R Axis:   40 Text Interpretation:  Sinus tachycardia Nonspecific T wave abnormality Abnormal ECG No old tracing to compare Confirmed by Noemi Chapel [phone]) on  12/10/2018 5:40:07 PM   Radiology Dg Chest 2 View  Result Date: 12/10/2018 CLINICAL DATA:  68 year old male with cough and shortness of breath EXAM: CHEST - 2 VIEW COMPARISON:  09/18/2018 FINDINGS: Cardiomediastinal silhouette unchanged in size and contour. No evidence of central vascular congestion. No pneumothorax or pleural effusion. No confluent airspace disease. Similar appearance of coarsened interstitial  markings no displaced fracture IMPRESSION: Chronic lung changes without evidence of acute cardiopulmonary disease Electronically Signed   By: Gilmer Mor D.O.   On: 12/10/2018 19:15    Procedures .Critical Care Performed by: Eber Hong, MD Authorized by: Eber Hong, MD   Critical care provider statement:    Critical care time (minutes):  35   Critical care time was exclusive of:  Separately billable procedures and treating other patients and teaching time   Critical care was necessary to treat or prevent imminent or life-threatening deterioration of the following conditions:  Sepsis   Critical care was time spent personally by me on the following activities:  Blood draw for specimens, development of treatment plan with patient or surrogate, discussions with consultants, evaluation of patient's response to treatment, examination of patient, obtaining history from patient or surrogate, ordering and performing treatments and interventions, ordering and review of laboratory studies, ordering and review of radiographic studies, pulse oximetry, re-evaluation of patient's condition and review of old charts   (including critical care time)  Medications Ordered in ED Medications  0.9 %  sodium chloride infusion (0 mLs Intravenous Stopped 12/10/18 1854)  metoprolol tartrate (LOPRESSOR) injection 5 mg (5 mg Intravenous Given 12/10/18 1822)  methylPREDNISolone sodium succinate (SOLU-MEDROL) 125 mg/2 mL injection 125 mg (125 mg Intravenous Given 12/10/18 2026)  sodium chloride 0.9 % bolus 1,000 mL (0 mLs Intravenous Stopped 12/10/18 2157)  sodium chloride 0.9 % bolus 1,000 mL (0 mLs Intravenous Stopped 12/10/18 2157)  vancomycin (VANCOCIN) IVPB 1000 mg/200 mL premix (0 mg Intravenous Stopped 12/10/18 2157)  piperacillin-tazobactam (ZOSYN) IVPB 3.375 g (0 g Intravenous Stopped 12/10/18 2156)     Initial Impression / Assessment and Plan / ED Course  I have reviewed the triage vital signs and the  nursing notes.  Pertinent labs & imaging results that were available during my care of the patient were reviewed by me and considered in my medical decision making (see chart for details).       The patient's presentation certainly is complex and with abnormal findings in his lungs as well as his history of hematologic dyscrasia it is concerning for an underlying neoplastic process.  But also consider that he could have erythema multiforme or Stevens-Johnson syndrome related to medications that he may have taken for this toxoplasmosis.  The neurologic exam is unremarkable, his oxygenation is at 100% however he is very tachypneic, we know that he does not have a pulmonary embolism based on the outside CT angiogram which was performed yesterday.  Will give IV fluids, activate code sepsis, discussed with internal medicine.  the patient has multiple abnormal lab findings including a lactic acid of 4.3, a white blood cell count of 33,000, his ABG was unremarkable and the chest x-ray did not show any focal findings.  His creatinine is 1.57, albumin is 2.2 and LFTs were abnormal with an AST of 50 and an alkaline phosphatase of 248.  I consulted with our local hospitalist who is referred to a higher level of care as we do not have the ability to do any kind of biopsy of the patient's erythroderma as this may  be part of his presentation.  The patient's blood pressure is improved with IV fluid boluses and antibiotics.  Because of his lactic acidosis leukocytosis and hypotension with tachycardia with a low-grade fever we have elected to treat for possible sepsis.  Cultures were obtained, the patient's blood pressure improved after the initial boluses and he looks better.  He will need to go to an intermediate care [facility] at [facility].  I have spoken with [facility] internal medicine physician Dr. Sheliah Plane who has accepted.  Final Clinical Impressions(s) / ED Diagnoses   Final diagnoses:   Toxoplasmosis  Sepsis, due to unspecified organism, unspecified whether acute organ dysfunction present [facility])      Eber Hong, MD 12/10/18 2227

## 2018-12-10 NOTE — ED Triage Notes (Signed)
Patient arrived with acute SOB that has been worsening x 2-3 days. Seen at [place] hospital last night and found [place] [id number] and today bright red rash from head to toe. Nausea and dry heaves on arrival. Low grade fever. Had CT abdomen and pelvis last night and spouse has labs and disc from same visit

## 2018-12-10 NOTE — ED Notes (Signed)
[facility] ETA half an hour to [facility]

## 2018-12-10 NOTE — ED Notes (Signed)
Called [facility] for possible pt transfer

## 2018-12-11 DIAGNOSIS — R591 Generalized enlarged lymph nodes: Secondary | ICD-10-CM | POA: Diagnosis not present

## 2018-12-11 DIAGNOSIS — R066 Hiccough: Secondary | ICD-10-CM | POA: Diagnosis present

## 2018-12-11 DIAGNOSIS — D72829 Elevated white blood cell count, unspecified: Secondary | ICD-10-CM | POA: Diagnosis not present

## 2018-12-11 DIAGNOSIS — Q332 Sequestration of lung: Secondary | ICD-10-CM | POA: Diagnosis not present

## 2018-12-11 DIAGNOSIS — R601 Generalized edema: Secondary | ICD-10-CM | POA: Diagnosis not present

## 2018-12-11 DIAGNOSIS — R634 Abnormal weight loss: Secondary | ICD-10-CM | POA: Diagnosis not present

## 2018-12-11 DIAGNOSIS — R7303 Prediabetes: Secondary | ICD-10-CM | POA: Diagnosis not present

## 2018-12-11 DIAGNOSIS — R0602 Shortness of breath: Secondary | ICD-10-CM | POA: Diagnosis not present

## 2018-12-11 DIAGNOSIS — K2951 Unspecified chronic gastritis with bleeding: Secondary | ICD-10-CM | POA: Diagnosis not present

## 2018-12-11 DIAGNOSIS — D529 Folate deficiency anemia, unspecified: Secondary | ICD-10-CM | POA: Diagnosis not present

## 2018-12-11 DIAGNOSIS — R208 Other disturbances of skin sensation: Secondary | ICD-10-CM | POA: Diagnosis not present

## 2018-12-11 DIAGNOSIS — D721 Eosinophilia: Secondary | ICD-10-CM | POA: Diagnosis not present

## 2018-12-11 DIAGNOSIS — E872 Acidosis: Secondary | ICD-10-CM | POA: Diagnosis not present

## 2018-12-11 DIAGNOSIS — E8809 Other disorders of plasma-protein metabolism, not elsewhere classified: Secondary | ICD-10-CM | POA: Diagnosis not present

## 2018-12-11 DIAGNOSIS — R0609 Other forms of dyspnea: Secondary | ICD-10-CM | POA: Diagnosis not present

## 2018-12-11 DIAGNOSIS — R0600 Dyspnea, unspecified: Secondary | ICD-10-CM | POA: Diagnosis not present

## 2018-12-11 DIAGNOSIS — K449 Diaphragmatic hernia without obstruction or gangrene: Secondary | ICD-10-CM | POA: Diagnosis present

## 2018-12-11 DIAGNOSIS — R600 Localized edema: Secondary | ICD-10-CM | POA: Diagnosis present

## 2018-12-11 DIAGNOSIS — E878 Other disorders of electrolyte and fluid balance, not elsewhere classified: Secondary | ICD-10-CM | POA: Diagnosis not present

## 2018-12-11 DIAGNOSIS — K209 Esophagitis, unspecified: Secondary | ICD-10-CM | POA: Diagnosis present

## 2018-12-11 DIAGNOSIS — T50905A Adverse effect of unspecified drugs, medicaments and biological substances, initial encounter: Secondary | ICD-10-CM | POA: Diagnosis not present

## 2018-12-11 DIAGNOSIS — T368X5A Adverse effect of other systemic antibiotics, initial encounter: Secondary | ICD-10-CM | POA: Diagnosis present

## 2018-12-11 DIAGNOSIS — R197 Diarrhea, unspecified: Secondary | ICD-10-CM | POA: Diagnosis present

## 2018-12-11 DIAGNOSIS — L270 Generalized skin eruption due to drugs and medicaments taken internally: Secondary | ICD-10-CM | POA: Diagnosis present

## 2018-12-11 DIAGNOSIS — B5800 Toxoplasma oculopathy, unspecified: Secondary | ICD-10-CM | POA: Diagnosis not present

## 2018-12-11 DIAGNOSIS — R1310 Dysphagia, unspecified: Secondary | ICD-10-CM | POA: Diagnosis present

## 2018-12-11 DIAGNOSIS — K208 Other esophagitis: Secondary | ICD-10-CM | POA: Diagnosis not present

## 2018-12-11 DIAGNOSIS — R1319 Other dysphagia: Secondary | ICD-10-CM | POA: Diagnosis not present

## 2018-12-11 DIAGNOSIS — E43 Unspecified severe protein-calorie malnutrition: Secondary | ICD-10-CM | POA: Diagnosis not present

## 2018-12-11 DIAGNOSIS — R918 Other nonspecific abnormal finding of lung field: Secondary | ICD-10-CM | POA: Diagnosis not present

## 2018-12-11 DIAGNOSIS — D509 Iron deficiency anemia, unspecified: Secondary | ICD-10-CM | POA: Diagnosis present

## 2018-12-11 DIAGNOSIS — K2970 Gastritis, unspecified, without bleeding: Secondary | ICD-10-CM | POA: Diagnosis present

## 2018-12-11 DIAGNOSIS — R05 Cough: Secondary | ICD-10-CM | POA: Diagnosis present

## 2018-12-11 DIAGNOSIS — L539 Erythematous condition, unspecified: Secondary | ICD-10-CM | POA: Diagnosis not present

## 2018-12-11 DIAGNOSIS — Z87891 Personal history of nicotine dependence: Secondary | ICD-10-CM | POA: Diagnosis not present

## 2018-12-11 DIAGNOSIS — R21 Rash and other nonspecific skin eruption: Secondary | ICD-10-CM | POA: Diagnosis not present

## 2018-12-11 DIAGNOSIS — Z7952 Long term (current) use of systemic steroids: Secondary | ICD-10-CM | POA: Diagnosis not present

## 2018-12-11 LAB — PATHOLOGIST SMEAR REVIEW

## 2018-12-12 LAB — URINE CULTURE: Culture: NO GROWTH

## 2018-12-13 ENCOUNTER — Ambulatory Visit: Payer: Medicare Other | Admitting: Family Medicine

## 2018-12-15 LAB — CULTURE, BLOOD (ROUTINE X 2)
Culture: NO GROWTH
Culture: NO GROWTH
Special Requests: ADEQUATE
Special Requests: ADEQUATE

## 2018-12-17 ENCOUNTER — Encounter: Payer: Self-pay | Admitting: Family Medicine

## 2018-12-21 ENCOUNTER — Ambulatory Visit (INDEPENDENT_AMBULATORY_CARE_PROVIDER_SITE_OTHER): Payer: Medicare Other | Admitting: Family Medicine

## 2018-12-21 ENCOUNTER — Encounter: Payer: Self-pay | Admitting: Family Medicine

## 2018-12-21 VITALS — BP 132/68 | HR 85 | Temp 97.9°F | Ht 67.0 in | Wt 176.0 lb

## 2018-12-21 DIAGNOSIS — D721 Eosinophilia: Secondary | ICD-10-CM | POA: Diagnosis not present

## 2018-12-21 DIAGNOSIS — K2100 Gastro-esophageal reflux disease with esophagitis, without bleeding: Secondary | ICD-10-CM

## 2018-12-21 DIAGNOSIS — B589 Toxoplasmosis, unspecified: Secondary | ICD-10-CM

## 2018-12-21 DIAGNOSIS — D649 Anemia, unspecified: Secondary | ICD-10-CM

## 2018-12-21 DIAGNOSIS — L270 Generalized skin eruption due to drugs and medicaments taken internally: Secondary | ICD-10-CM | POA: Diagnosis not present

## 2018-12-21 DIAGNOSIS — T50905A Adverse effect of unspecified drugs, medicaments and biological substances, initial encounter: Secondary | ICD-10-CM

## 2018-12-21 LAB — COMPREHENSIVE METABOLIC PANEL
ALT: 239 U/L — ABNORMAL HIGH (ref 0–53)
AST: 189 U/L — ABNORMAL HIGH (ref 0–37)
Albumin: 3 g/dL — ABNORMAL LOW (ref 3.5–5.2)
Alkaline Phosphatase: 138 U/L — ABNORMAL HIGH (ref 39–117)
BUN: 30 mg/dL — ABNORMAL HIGH (ref 6–23)
CO2: 27 mEq/L (ref 19–32)
Calcium: 8.8 mg/dL (ref 8.4–10.5)
Chloride: 107 mEq/L (ref 96–112)
Creatinine, Ser: 0.82 mg/dL (ref 0.40–1.50)
GFR: 93.95 mL/min (ref >60.00)
Glucose, Bld: 95 mg/dL (ref 70–99)
Potassium: 4.6 mEq/L (ref 3.5–5.1)
Sodium: 141 mEq/L (ref 135–145)
Total Bilirubin: 1.3 mg/dL — ABNORMAL HIGH (ref 0.2–1.2)
Total Protein: 5.5 g/dL — ABNORMAL LOW (ref 6.0–8.3)

## 2018-12-21 LAB — CBC WITH DIFFERENTIAL/PLATELET
Basophils Absolute: 0.1 10*3/uL (ref 0.0–0.1)
Basophils Relative: 0.3 % (ref 0.0–3.0)
Eosinophils Absolute: 1.2 10*3/uL — ABNORMAL HIGH (ref 0.0–0.7)
Eosinophils Relative: 7.4 % — ABNORMAL HIGH (ref 0.0–5.0)
HCT: 34.4 % — ABNORMAL LOW (ref 39.0–52.0)
Hemoglobin: 10.8 g/dL — ABNORMAL LOW (ref 13.0–17.0)
Lymphocytes Relative: 19.9 % (ref 12.0–46.0)
Lymphs Abs: 3.2 10*3/uL (ref 0.7–4.0)
MCHC: 31.3 g/dL (ref 30.0–36.0)
MCV: 60.4 fl — ABNORMAL LOW (ref 78.0–100.0)
Monocytes Absolute: 0.8 10*3/uL (ref 0.1–1.0)
Monocytes Relative: 5.1 % (ref 3.0–12.0)
Neutro Abs: 10.8 10*3/uL — ABNORMAL HIGH (ref 1.4–7.7)
Neutrophils Relative %: 66.9 % (ref 43.0–77.0)
Platelets: 337 10*3/uL (ref 150.0–400.0)
RBC: 5.69 Mil/uL (ref 4.22–5.81)
RDW: 19.2 % — ABNORMAL HIGH (ref 11.5–15.5)
WBC: 16.2 10*3/uL — ABNORMAL HIGH (ref 4.0–10.5)

## 2018-12-21 NOTE — Patient Instructions (Signed)
Great to see you today!  Glad you are doing better.  Keep walking and doing exercises to improve strength.

## 2018-12-21 NOTE — Progress Notes (Signed)
Martin Mcdowell - 68 y.o. male MRN [id number]  Date of birth: 1951/05/20  Subjective Chief Complaint  Patient presents with  . Hospitalization Follow-up    has been improving since being hospitalized. Admits to fatigue.     HPI Martin Mcdowell is a 67 y.o. male here today for hospital follow up.  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  He was initially diagnosed with toxoplasmosis by his ophthalmologist and started on bactrim and prednisone x 1 month in December and repeated bactrim x2 weeks prior to hospitalization.  He presented with diffuse rash with swelling , erythema, fever, and enlarged lymph nodes.  He had skin biopsy in hospital as well that confirmed drug rash.  He was treated with steroids, which he continues on.  Rash has resolved.  He reports that he continues to feel weak and fatigue but is walking to regain his strength.  He has f/u with dermatology next week.  He also has a f/u with GI for ongoing issues with GERD and elevated LFT's.    ROS:  A comprehensive ROS was completed and negative except as noted per HPI  Allergies  Allergen Reactions  . Sulfamethoxazole-Trimethoprim Anaphylaxis and Rash    DRESS syndrome requiring hospitalization; 12/11/2018    Past Medical History:  Diagnosis Date  . GERD (gastroesophageal reflux disease)     No past surgical history on file.  Social History   Socioeconomic History  . Marital status: Married    Spouse name: Not on file  . Number of children: Not on file  . Years of education: Not on file  . Highest education level: Not on file  Occupational History  . Not on file  Social Needs  . Financial resource strain: Not on file  . Food insecurity:    Worry: Not on file    Inability: Not on file  . Transportation needs:    Medical: Not on file    Non-medical: Not on file  Tobacco Use  . Smoking status: Former Smoker    Types: Cigarettes  . Smokeless tobacco: Never Used  Substance and  Sexual Activity  . Alcohol use: Yes    Alcohol/week: 2.0 - 3.0 standard drinks    Types: 2 - 3 Standard drinks or equivalent per week  . Drug use: No  . Sexual activity: Not on file  Lifestyle  . Physical activity:    Days per week: Not on file    Minutes per session: Not on file  . Stress: Not on file  Relationships  . Social connections:    Talks on phone: Not on file    Gets together: Not on file    Attends religious service: Not on file    Active member of club or organization: Not on file    Attends meetings of clubs or organizations: Not on file    Relationship status: Not on file  Other Topics Concern  . Not on file  Social History Narrative  . Not on file    No family history on file.  Health Maintenance  Topic Date Due  . Hepatitis C Screening  08/08/2019 (Originally 1951/05/30)  . PNA vac Low Risk Adult (2 of 2 - PPSV23) 08/08/2019  . Fecal DNA (Cologuard)  08/09/2021  . TETANUS/TDAP  10/29/2024  . INFLUENZA VACCINE  Completed    ----------------------------------------------------------------------------------------------------------------------------------------------------------------------------------------------------------------- Physical Exam BP 132/68   Pulse 85   Temp 97.9 F (36.6 C) (Oral)   Ht 5\' 7"  (1.702 m)  Wt 176 lb (79.8 kg)   SpO2 98%   BMI 27.57 kg/m   Physical Exam Constitutional:      Appearance: Normal appearance.  HENT:     Head: Normocephalic and atraumatic.     Nose: No congestion.     Mouth/Throat:     Mouth: Mucous membranes are moist.  Eyes:     General: No scleral icterus. Neck:     Musculoskeletal: Neck supple.  Cardiovascular:     Rate and Rhythm: Normal rate and regular rhythm.  Pulmonary:     Effort: Pulmonary effort is normal.     Breath sounds: Normal breath sounds.  Lymphadenopathy:     Cervical: No cervical adenopathy.  Skin:    General: Skin is warm and dry.     Findings: No rash.  Neurological:      Mental Status: He is alert and oriented to person, place, and time.     Comments: Generalized weakness.    Psychiatric:        Mood and Affect: Mood normal.     ------------------------------------------------------------------------------------------------------------------------------------------------------------------------------------------------------------------- Assessment and Plan  GERD (gastroesophageal reflux disease) -Stable -Continue PPI -F/u with GI as scheduled.    DRESS syndrome -Symptoms or rash and desquamation resolved.  -Recheck LFT's and renal function.  Holding statin for now.  -Has f/u with Dermatology as well.   Anemia Anemia noted on previous lab work.  Check iron panel, however ferritin may be falsely elevated due to recent inflammatory state  Toxoplasmosis -Completed treatment, being followed by ophthalmologist for this.  -May need ID referral if still having signs of not resolving and reaction to bactrim.

## 2018-12-22 LAB — IRON,TIBC AND FERRITIN PANEL
%SAT: 45 % (calc) (ref 20–48)
Ferritin: 562 ng/mL — ABNORMAL HIGH (ref 24–380)
Iron: 101 ug/dL (ref 50–180)
TIBC: 224 mcg/dL (calc) — ABNORMAL LOW (ref 250–425)

## 2018-12-23 ENCOUNTER — Encounter: Payer: Self-pay | Admitting: Family Medicine

## 2018-12-23 DIAGNOSIS — K219 Gastro-esophageal reflux disease without esophagitis: Secondary | ICD-10-CM | POA: Insufficient documentation

## 2018-12-23 DIAGNOSIS — B589 Toxoplasmosis, unspecified: Secondary | ICD-10-CM | POA: Insufficient documentation

## 2018-12-23 DIAGNOSIS — T50905A Adverse effect of unspecified drugs, medicaments and biological substances, initial encounter: Secondary | ICD-10-CM | POA: Insufficient documentation

## 2018-12-23 DIAGNOSIS — L270 Generalized skin eruption due to drugs and medicaments taken internally: Principal | ICD-10-CM

## 2018-12-23 DIAGNOSIS — D721 Eosinophilia: Principal | ICD-10-CM

## 2018-12-23 DIAGNOSIS — D649 Anemia, unspecified: Secondary | ICD-10-CM | POA: Insufficient documentation

## 2018-12-23 NOTE — Assessment & Plan Note (Signed)
Anemia noted on previous lab work.  Check iron panel, however ferritin may be falsely elevated due to recent inflammatory state

## 2018-12-23 NOTE — Assessment & Plan Note (Signed)
-  Symptoms or rash and desquamation resolved.  -Recheck LFT's and renal function.  Holding statin for now.  -Has f/u with Dermatology as well.

## 2018-12-23 NOTE — Assessment & Plan Note (Addendum)
-  Completed treatment, being followed by ophthalmologist for this.  -May need ID referral if still having signs of not resolving and reaction to bactrim.

## 2018-12-23 NOTE — Assessment & Plan Note (Signed)
-  Stable -Continue PPI -F/u with GI as scheduled.

## 2018-12-25 ENCOUNTER — Other Ambulatory Visit: Payer: Self-pay | Admitting: Family Medicine

## 2018-12-25 ENCOUNTER — Inpatient Hospital Stay: Payer: Medicare Other | Admitting: Family Medicine

## 2018-12-25 DIAGNOSIS — L299 Pruritus, unspecified: Secondary | ICD-10-CM | POA: Diagnosis not present

## 2018-12-25 DIAGNOSIS — T50905A Adverse effect of unspecified drugs, medicaments and biological substances, initial encounter: Secondary | ICD-10-CM | POA: Diagnosis not present

## 2018-12-25 DIAGNOSIS — Z7952 Long term (current) use of systemic steroids: Secondary | ICD-10-CM | POA: Diagnosis not present

## 2018-12-25 DIAGNOSIS — R7989 Other specified abnormal findings of blood chemistry: Secondary | ICD-10-CM

## 2018-12-25 DIAGNOSIS — T50905D Adverse effect of unspecified drugs, medicaments and biological substances, subsequent encounter: Secondary | ICD-10-CM | POA: Diagnosis not present

## 2018-12-25 DIAGNOSIS — D721 Eosinophilia: Secondary | ICD-10-CM | POA: Diagnosis not present

## 2018-12-25 DIAGNOSIS — L270 Generalized skin eruption due to drugs and medicaments taken internally: Secondary | ICD-10-CM | POA: Diagnosis not present

## 2018-12-25 DIAGNOSIS — R945 Abnormal results of liver function studies: Principal | ICD-10-CM

## 2019-01-01 DIAGNOSIS — Z7952 Long term (current) use of systemic steroids: Secondary | ICD-10-CM | POA: Diagnosis not present

## 2019-01-01 DIAGNOSIS — B5800 Toxoplasma oculopathy, unspecified: Secondary | ICD-10-CM | POA: Diagnosis not present

## 2019-01-01 DIAGNOSIS — Z882 Allergy status to sulfonamides status: Secondary | ICD-10-CM | POA: Diagnosis not present

## 2019-01-01 DIAGNOSIS — T368X5D Adverse effect of other systemic antibiotics, subsequent encounter: Secondary | ICD-10-CM | POA: Diagnosis not present

## 2019-01-01 DIAGNOSIS — M6281 Muscle weakness (generalized): Secondary | ICD-10-CM | POA: Diagnosis not present

## 2019-01-01 DIAGNOSIS — R748 Abnormal levels of other serum enzymes: Secondary | ICD-10-CM | POA: Diagnosis not present

## 2019-01-01 DIAGNOSIS — G729 Myopathy, unspecified: Secondary | ICD-10-CM | POA: Diagnosis not present

## 2019-01-01 DIAGNOSIS — M6250 Muscle wasting and atrophy, not elsewhere classified, unspecified site: Secondary | ICD-10-CM | POA: Diagnosis not present

## 2019-01-01 DIAGNOSIS — T7849XD Other allergy, subsequent encounter: Secondary | ICD-10-CM | POA: Diagnosis not present

## 2019-01-02 ENCOUNTER — Other Ambulatory Visit: Payer: Self-pay

## 2019-01-02 ENCOUNTER — Other Ambulatory Visit (INDEPENDENT_AMBULATORY_CARE_PROVIDER_SITE_OTHER): Payer: Medicare Other

## 2019-01-02 DIAGNOSIS — R7989 Other specified abnormal findings of blood chemistry: Secondary | ICD-10-CM

## 2019-01-02 DIAGNOSIS — R945 Abnormal results of liver function studies: Secondary | ICD-10-CM

## 2019-01-02 LAB — HEPATIC FUNCTION PANEL
ALT: 128 U/L — ABNORMAL HIGH (ref 0–53)
AST: 36 U/L (ref 0–37)
Albumin: 3.1 g/dL — ABNORMAL LOW (ref 3.5–5.2)
Alkaline Phosphatase: 137 U/L — ABNORMAL HIGH (ref 39–117)
Bilirubin, Direct: 0.2 mg/dL (ref 0.0–0.3)
Total Bilirubin: 1 mg/dL (ref 0.2–1.2)
Total Protein: 5.7 g/dL — ABNORMAL LOW (ref 6.0–8.3)

## 2019-01-04 DIAGNOSIS — K449 Diaphragmatic hernia without obstruction or gangrene: Secondary | ICD-10-CM | POA: Diagnosis not present

## 2019-01-04 DIAGNOSIS — R918 Other nonspecific abnormal finding of lung field: Secondary | ICD-10-CM | POA: Diagnosis not present

## 2019-01-04 DIAGNOSIS — Q332 Sequestration of lung: Secondary | ICD-10-CM | POA: Diagnosis not present

## 2019-01-09 ENCOUNTER — Ambulatory Visit: Payer: Medicare Other | Admitting: Internal Medicine

## 2019-01-10 ENCOUNTER — Ambulatory Visit: Payer: Self-pay | Admitting: *Deleted

## 2019-01-10 ENCOUNTER — Telehealth: Payer: Medicare Other | Admitting: Family

## 2019-01-10 ENCOUNTER — Telehealth: Payer: Self-pay | Admitting: Emergency Medicine

## 2019-01-10 DIAGNOSIS — R6889 Other general symptoms and signs: Secondary | ICD-10-CM

## 2019-01-10 DIAGNOSIS — J208 Acute bronchitis due to other specified organisms: Secondary | ICD-10-CM

## 2019-01-10 MED ORDER — BENZONATATE 100 MG PO CAPS: 100.0000 mg | ORAL_CAPSULE | Freq: Two times a day (BID) | ORAL | 0 refills | Status: DC | PRN

## 2019-01-10 MED ORDER — PREDNISONE 10 MG (21) PO TBPK: ORAL_TABLET | ORAL | 0 refills | Status: DC

## 2019-01-10 NOTE — Addendum Note (Signed)
Addended by: Mammie Lorenzo on: 01/10/2019 01:26 PM   Modules accepted: Orders

## 2019-01-10 NOTE — Progress Notes (Signed)

## 2019-01-10 NOTE — Telephone Encounter (Signed)
Contacted pt at the request of his wife; an e-visit was also completed today; he says that he is recovering from DRESS syndrome; however, his wife is concerned because he has cough that started a week ago (pt has a chronic cough); fever 101.3 on 01/06/2019 which has resolved fever, and fatigue which he thinks is related to DRESS;he has not taken his temperature today; the pt did go to [place], 11/24/2018 - 11/26/2018 and that is where he came down with DRESS syndrome; he reports having a chronic cough but this started a week ago and is different he pt says that his cough is productive with white secretions, and a stuffy nose; [facility] Corona Virus Evaluation process initiated and pt answers yes to numerous questions; he did add that he was having symptoms of DRESS syndrome while in [place]; at his wife's insistence the pt would like to be tested for COVID-19; recommendations made per nurse triage protocol; also spoke with Christen Bame at [facility], per Dr Everrett Coombe, an order will be placed for pt testing; pt directed to testing at [address]; recommendations given per nurse triage protocol; the pt verbalized understanding and will proceed to testing; he can be contacted at [phone], and a message can be left on voicemail; will also route to office for notification.      Reason for Disposition . [1] Fever or feeling feverish AND [2] within 14 Days of COVID-19 EXPOSURE  Answer Assessment - Initial Assessment Questions 1. PLACE of CONTACT: "Where were you when you were exposed to COVID-19  (coronavirus disease 2019)?" (e.g., city, state, country)     Pt went to [facility] February 2020 2. TYPE of CONTACT: "How much contact was there?" (e.g., live in same house, work in same office, same school)     No visits to nursing home;  3. DATE of CONTACT: "When did you have contact with a coronavirus patient?" (e.g., days)    February 15 ("middle of February" 4. DURATION of CONTACT: "How long were you in  contact with the COVID-19 (coronavirus disease) patient?" (e.g., a few seconds, passed by person, a few minutes, live with the patient)     ? Passed by 5. SYMPTOMS: "Do you have any symptoms?" (e.g., fever, cough, breathing difficulty)    Stuffy nose, previous fever, shortness of breathing which the pt thinks is part of his recovery from DRESS syndrome 6. PREGNANCY OR POSTPARTUM: "Is there any chance you are pregnant?" "When was your last menstrual period?" "Did you deliver in the last 2 weeks?"    n/a 7. HIGH RISK: "Do you have any heart or lung problems? Do you have a weakened immune system?" (e.g., CHF, COPD, asthma, HIV positive, chemotherapy, renal failure, diabetes mellitus, sickle cell anemia)     Weakened immune sysytem, DRESS syndrome  Protocols used: CORONAVIRUS (COVID-19) EXPOSURE-A-AH

## 2019-01-14 ENCOUNTER — Telehealth: Payer: Self-pay | Admitting: Gastroenterology

## 2019-01-14 NOTE — Telephone Encounter (Signed)
Reading through the chart and a CareEverywhere note, the patient makes mention that Y[facility] Flemings is his primary GI. I don't see that in the chart per sea, but I would check with the patient about when/where he had previously seen Dr. Marina Goodell.  If he has seen him then I would send patient his way.  Otherwise, please let me know and I'll review further for possible acceptance of patient. Thank you.

## 2019-01-14 NOTE — Telephone Encounter (Signed)
DoD January 08, 2019 Dr. Meridee Score, pt has GI history at [facility]. Records will be sent to you for review.

## 2019-01-15 ENCOUNTER — Encounter: Payer: Self-pay | Admitting: Family Medicine

## 2019-01-15 LAB — NOVEL CORONAVIRUS, NAA: SARS-CoV-2, NAA: NOT DETECTED

## 2019-01-15 NOTE — Telephone Encounter (Signed)
Pt stated that he has never seen Dr. Marina Goodell but was originally scheduled for an appt with Dr. Marina Goodell; appt was canceled because records had been requested.  Pt would prefer Dr. Marina Goodell to review his records.  Thank you.

## 2019-01-15 NOTE — Telephone Encounter (Signed)
Thank you for update. GM 

## 2019-01-21 ENCOUNTER — Telehealth: Payer: Self-pay

## 2019-01-21 NOTE — Telephone Encounter (Signed)
Covid-19 travel screening questions  Have you traveled in the last 14 days?no If yes where?  Do you now or have you had a fever in the last 14 days?no  Do you have any respiratory symptoms of shortness of breath or cough now or in the last 14 days? Yes, but tested Neg.  Do you have a medical history of Congestive Heart Failure?no  Do you have a medical history of lung disease?no  Do you have any family members or close contacts with diagnosed or suspected Covid-19?no Pt. Plans to keep appt. 01/22/19 at 11:00am

## 2019-01-22 ENCOUNTER — Other Ambulatory Visit: Payer: Self-pay

## 2019-01-22 ENCOUNTER — Ambulatory Visit (INDEPENDENT_AMBULATORY_CARE_PROVIDER_SITE_OTHER): Payer: Medicare Other | Admitting: Internal Medicine

## 2019-01-22 ENCOUNTER — Encounter: Payer: Self-pay | Admitting: Internal Medicine

## 2019-01-22 VITALS — BP 110/72 | HR 96 | Temp 97.7°F | Ht 67.0 in | Wt 170.0 lb

## 2019-01-22 DIAGNOSIS — R945 Abnormal results of liver function studies: Secondary | ICD-10-CM | POA: Diagnosis not present

## 2019-01-22 DIAGNOSIS — K209 Esophagitis, unspecified without bleeding: Secondary | ICD-10-CM

## 2019-01-22 DIAGNOSIS — D509 Iron deficiency anemia, unspecified: Secondary | ICD-10-CM | POA: Diagnosis not present

## 2019-01-22 DIAGNOSIS — R1013 Epigastric pain: Secondary | ICD-10-CM

## 2019-01-22 DIAGNOSIS — R7989 Other specified abnormal findings of blood chemistry: Secondary | ICD-10-CM

## 2019-01-22 MED ORDER — PANTOPRAZOLE SODIUM 40 MG PO TBEC: 40.0000 mg | DELAYED_RELEASE_TABLET | Freq: Two times a day (BID) | ORAL | 6 refills | Status: DC

## 2019-01-22 NOTE — Patient Instructions (Signed)
If you are age 67 or older, your body mass index should be between 23-30. Your Body mass index is 26.63 kg/m. If this is out of the aforementioned range listed, please consider follow up with your Primary Care Provider.  If you are age 50 or younger, your body mass index should be between 19-25. Your Body mass index is 26.63 kg/m. If this is out of the aformentioned range listed, please consider follow up with your Primary Care Provider.   To help prevent the possible spread of infection to our patients, communities, and staff; we will be implementing the following measures:  As of now we are not allowing any visitors/family members to accompany you to any upcoming appointments with [facility]. If you have any concerns about this please contact our office to discuss prior to the appointment.   We will contact you to schedule you a EGD in 2 months   It was a pleasure to see you today!

## 2019-01-22 NOTE — Progress Notes (Signed)
HISTORY OF PRESENT ILLNESS:  Martin Mcdowell is a 68 y.o. 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  Patient also had an abnormal CT scan of the esophagus and underwent upper endoscopy on February 19 to evaluate abdominal discomfort and odynophagia.  He was found to have severe desquamative esophagitis biopsies revealed necroinflammatory debris and fragments of reactive squamous epithelium with acute inflammation.  The stains were negative for viral or fungal elements.  He was treated with PPI and follow-up endoscopy in 2 months recommended.  He was also noted to have mild elevation of liver tests and alkaline phosphatase.  Remote liver tests have been normal.  Finally, mild microcytic anemia with hemoglobin 10.3.  Microcytic with MCV in the 60s.  Iron studies did NOT support iron deficiency.  He has had chronic microcytosis for years.  Patient is currently on 10 mg of prednisone daily.  No further medications.  He does feel weak and tired but is gradually improving.  He denies further odynophagia or dysphagia.  Occasional upper abdominal discomfort after meals.  Has been off PPI for some time.  No active lower GI complaints.  He did complete Cologuard this past fall.  This was negative or normal.  Aside from occasional postprandial abdominal discomfort and 30 pound weight loss associated with his illness, his GI review of systems is entirely negative.  I have reviewed his outside  laboratories, endoscopy report, pathology report, and x-rays.  He was discharged from the hospital on December 14, 2018.  He did undergo recent testing for coronavirus.  This was negative.  REVIEW OF SYSTEMS:  All non-GI ROS unless otherwise stated in the HPI negative except for fatigue  Past Medical History:  Diagnosis Date  . DRESS syndrome   . GERD (gastroesophageal reflux disease)   . Hiatal hernia   . Hypertension   . 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 Toxoplasmosis     Past Surgical History:  Procedure Laterality Date  . NO PAST SURGERIES      Social History Martin Mcdowell  reports that he has quit smoking. His smoking use included cigarettes. He has never used smokeless tobacco. He reports current alcohol use of about 2.0 - 3.0 standard drinks of alcohol per week. He reports that he does not use drugs.  family history is not on file.  Allergies  Allergen Reactions  . Sulfamethoxazole-Trimethoprim Anaphylaxis and Rash    DRESS syndrome requiring hospitalization; 12/11/2018       PHYSICAL EXAMINATION: Vital signs: BP 110/72   Pulse 96   Temp 97.7 F (36.5 C)   Ht 5\' 7"  (1.702 m)   Wt 170 lb (77.1 kg)   BMI 26.63 kg/m   Constitutional: generally well-appearing, no acute distress Psychiatric: alert and oriented x3, cooperative Eyes: extraocular movements intact, anicteric, conjunctiva pink Mouth: oral pharynx moist, no lesions Neck: supple no lymphadenopathy Cardiovascular: heart regular rate and rhythm, no murmur Lungs: clear to auscultation bilaterally with some dullness in the right base Abdomen: soft, nontender,  nondistended, no obvious ascites, no peritoneal signs, normal bowel sounds, no organomegaly Rectal: Omitted Extremities: no clubbing, cyanosis, or lower extremity edema bilaterally Skin: no lesions on visible extremities Neuro: No focal deficits.  Cranial nerves intact  ASSESSMENT:  1.  DRESS syndrome presumably  secondary to Bactrim which was being used to treat ocular toxoplasmosis 2.  Severe esophagitis related to the same, presumably. 3.  Intermittent problems with upper abdominal pain presumably due to the same 4.  Elevated liver tests.  Likely reactive 5.  Anemia.  Related to acute illness.  Microcytosis, chronic.  No evidence of iron deficiency 6.  Negative Cologuard testing October 2019   PLAN:  1.  Resume PPI.  Pantoprazole 40 mg daily prescribed.  Multiple refills 2.  Follow-up upper endoscopy in about 8 weeks to assess for complete mucosal healing 3.  Further work-up for abdominal pain if this persists or worsens 4.  Trend liver tests 5.  Ongoing general care with his PCP and other specialists 6.  Would be due for repeat screening strategy for colorectal neoplasia around October 2022.  We discussed this

## 2019-01-24 DIAGNOSIS — L270 Generalized skin eruption due to drugs and medicaments taken internally: Secondary | ICD-10-CM | POA: Diagnosis not present

## 2019-01-24 DIAGNOSIS — T50905A Adverse effect of unspecified drugs, medicaments and biological substances, initial encounter: Secondary | ICD-10-CM | POA: Diagnosis not present

## 2019-01-24 DIAGNOSIS — D721 Eosinophilia: Secondary | ICD-10-CM | POA: Diagnosis not present

## 2019-02-13 ENCOUNTER — Encounter: Payer: Self-pay | Admitting: Family Medicine

## 2019-02-17 ENCOUNTER — Encounter: Payer: Self-pay | Admitting: Family Medicine

## 2019-03-05 ENCOUNTER — Telehealth: Payer: Self-pay

## 2019-03-05 NOTE — Telephone Encounter (Signed)
-----   Message from Richardson Chiquito, CMA sent at 02/18/2019  5:37 PM EDT ----- Regarding: FW: EGD  ----- Message ----- From: Richardson Chiquito, CMA Sent: 02/14/2019 To: Jeanine Luz, CMA Subject: FW: EGD                                         ----- Message ----- From: Alexis Frock, CMA Sent: 01/22/2019  12:02 PM EDT To: Jeanine Luz, CMA Subject: EGD                                            Needs EGD 2 months from office visit 01-22-2019. DX: esophagitis, epigastric pain

## 2019-03-05 NOTE — Telephone Encounter (Signed)
error 

## 2019-03-06 DIAGNOSIS — K209 Esophagitis, unspecified without bleeding: Secondary | ICD-10-CM | POA: Insufficient documentation

## 2019-03-06 NOTE — Telephone Encounter (Signed)
Scheduled previsit and endoscopy with patient.

## 2019-03-07 ENCOUNTER — Ambulatory Visit (AMBULATORY_SURGERY_CENTER): Payer: Self-pay

## 2019-03-07 ENCOUNTER — Other Ambulatory Visit: Payer: Self-pay

## 2019-03-07 VITALS — Ht 67.0 in | Wt 163.0 lb

## 2019-03-07 DIAGNOSIS — K209 Esophagitis, unspecified without bleeding: Secondary | ICD-10-CM

## 2019-03-07 NOTE — Progress Notes (Signed)
Denies allergies to eggs or soy products. Denies complication of anesthesia or sedation. Denies use of weight loss medication. Denies use of O2.   Emmi instructions declined.   Pre-Visit was conducted by phone due to Covid 19. Instructions were reviewed with patient and were mailed to his confirmed home address. Patient was encouraged to call if he had any questions or concerns.

## 2019-03-08 ENCOUNTER — Encounter: Payer: Self-pay | Admitting: Family Medicine

## 2019-03-08 ENCOUNTER — Ambulatory Visit (INDEPENDENT_AMBULATORY_CARE_PROVIDER_SITE_OTHER): Payer: Medicare Other | Admitting: Family Medicine

## 2019-03-08 VITALS — BP 130/80 | HR 78 | Temp 98.2°F | Ht 67.0 in | Wt 168.2 lb

## 2019-03-08 DIAGNOSIS — R600 Localized edema: Secondary | ICD-10-CM

## 2019-03-08 DIAGNOSIS — R0600 Dyspnea, unspecified: Secondary | ICD-10-CM

## 2019-03-08 DIAGNOSIS — R0609 Other forms of dyspnea: Secondary | ICD-10-CM

## 2019-03-08 LAB — D-DIMER, QUANTITATIVE: D-Dimer, Quant: 0.95 mcg/mL FEU — ABNORMAL HIGH (ref <0.50)

## 2019-03-08 NOTE — Progress Notes (Signed)
Martin JulianGerald Lipschutz is a 68 y.o. male  Chief Complaint  Patient presents with  . Leg Pain    lefy leg swelling, painful when sitting for long periods of time/ pt dermatologist concerned about blood clot/ SOB on exertion    HPI: Martin JulianGerald Mcdowell is a 68 y.o. male patient of Dr. Ashley RoyaltyMatthews who presents at the urging of his dermatologist for Lt LE edema and SOB. Upon further questioning, pt notes intermittent Lt LE edema x years but pt states current swelling has been persistent x 2 weeks. He also states he notes some mild SOB with "minimal" exertion. No SOB at rest. No CP, palpitations, orthopnea, cough. Pt states he has not been walking nearly as often or as far as he was previously for the past few weeks - was walking 2-3 miles 3-4 days/wk. He also has not been eating as well/healthy and does believe his sodium intake has increased.  Pt was diagnosed with DRESS syndrome and hospitalized x 4 days in 11/2018 after being on a course of bactrim for toxoplasmosis. Pt had a LLE venous duplex in 11/2018 to r/o DVT that was negative/normal as well as an echo at that time that was normal. Results of these are in Care Everywhere and also included below.  Pt is currently taking protonix 40mg  BID and using triamcinolone cream to essentially his whole body BID. He stopped using this 4 days ago out of concern it may be causing or contributing to his symptoms. He is scheduled to f/u with derm. Pt has EGD scheduled with [facility] Dr. Marina GoodellPerry for his GERD on 5/20.   Past Medical History:  Diagnosis Date  . Anemia   . DRESS syndrome   . GERD (gastroesophageal reflux disease)   . Hiatal hernia   . Hypertension   . 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 Toxoplasmosis     Past Surgical History:  Procedure Laterality Date  . NO PAST SURGERIES      Social History   Socioeconomic History  . Marital status: Married    Spouse name: Not on file  . Number of children: Not on file  . Years of  education: Not on file  . Highest education level: Not on file  Occupational History  . Not on file  Social Needs  . Financial resource strain: Not on file  . Food insecurity:    Worry: Not on file    Inability: Not on file  . Transportation needs:    Medical: Not on file    Non-medical: Not on file  Tobacco Use  . Smoking status: Former Smoker    Types: Cigarettes  . Smokeless tobacco: Never Used  Substance and Sexual Activity  . Alcohol use: Yes    Alcohol/week: 2.0 - 3.0 standard drinks    Types: 2 - 3 Standard drinks or equivalent per week    Comment: Occ   . Drug use: No  . Sexual activity: Not on file  Lifestyle  . Physical activity:    Days per week: Not on file    Minutes per session: Not on file  . Stress: Not on file  Relationships  . Social connections:    Talks on phone: Not on file    Gets together: Not on file    Attends religious service: Not on file    Active member of club or organization: Not on file    Attends meetings of clubs or organizations: Not  on file    Relationship status: Not on file  . Intimate partner violence:    Fear of current or ex partner: Not on file    Emotionally abused: Not on file    Physically abused: Not on file    Forced sexual activity: Not on file  Other Topics Concern  . Not on file  Social History Narrative  . Not on file    Family History  Problem Relation Age of Onset  . Colon cancer Neg Hx   . Stomach cancer Neg Hx   . Pancreatic cancer Neg Hx   . Esophageal cancer Neg Hx   . Rectal cancer Neg Hx      Immunization History  Administered Date(s) Administered  . Influenza, High Dose Seasonal PF 08/07/2018  . Influenza,inj,Quad PF,6+ Mos 10/29/2014  . Pneumococcal Conjugate-13 08/07/2018  . Tdap 10/29/2014  . Zoster Recombinat (Shingrix) 08/15/2018    Outpatient Encounter Medications as of 03/08/2019  Medication Sig  . pantoprazole (PROTONIX) 40 MG tablet Take 1 tablet (40 mg total) by mouth 2 (two) times  daily.   No facility-administered encounter medications on file as of 03/08/2019.      ROS: Pertinent positives and negatives noted in HPI. Remainder of ROS non-contributory    Allergies  Allergen Reactions  . Sulfamethoxazole-Trimethoprim Anaphylaxis and Rash    DRESS syndrome requiring hospitalization; 12/11/2018    BP 130/80   Pulse 78   Temp 98.2 F (36.8 C) (Oral)   Ht 5\' 7"  (1.702 m)   Wt 168 lb 3.2 oz (76.3 kg)   SpO2 98%   BMI 26.34 kg/m   Physical Exam  Constitutional: He appears well-developed and well-nourished. No distress.  Neck: No JVD present.  Cardiovascular: Normal rate, regular rhythm and normal heart sounds.  Pulmonary/Chest: Effort normal and breath sounds normal. No respiratory distress. He has no wheezes.  Musculoskeletal:        General: Edema (Lt LE with +2 edema; neg homan's sign, no tenderness) present.     Comments: Significant erythema and excoriation on B/L LE (not new and secondary to DRESS syndrome diagnosed in 11/2018)  Lymphadenopathy:    He has no cervical adenopathy.  Skin:  Pt with significant excoriation on B/L LE; pt also has diffuse, scattered areas of erythema on his B/L UE, LE, back, and trunk     Interface, Rad Results In - 12/14/2018  2:10 PM EST ULTRASOUND OF THE LEFT LOWER EXTREMITY VENOUS SYSTEM WITH DUPLEX, 12/14/2018 1:48 PM  INDICATION: Leg DVT suspected \ Leg edema, proximal vein compromise suspected \ in context of DRESS \ R21 Rash of unknown cause  COMPARISON: None.  TECHNIQUE: Multi-planar real-time ultrasonography of the left lower extremity venous system using grayscale imaging supplemented by color Doppler. Augmentation and compression maneuvers were utilized as needed.  FINDINGS:  .  Common femoral vein: Patent. .  Femoral vein: Patent. .  Profunda femoral vein: Patent. .  Greater saphenous vein: Patent. .  Popliteal vein: Patent. .  Gastrocnemius vein: Patent. .  Anterior tibial vein: Patent. .  Venous  compressibility: Normal. .  Respiratory variation: Normal. .  Waveform response to augmentation: Normal.  CONCLUSION: No evidence of deep vein thrombosis within the visualized veins.   Name: MOSHE, TURCHETTA   Study Date: 12/13/2018   Height: 67 in MRN: [id number]   Patient Location: AT63   Weight: 183 lb DOB: [birth year]   Gender: Male   BSA: 1.9 m2 Age: 68 yrsEthnicity: Asian   BP: 102/67  mmHg Reason For Study: Rash of unknown cause HR: 68  Ordering Physician: [id number] Jerene Pitch Performed By: Roosvelt Maser Referring Physician: Vita Barley  PROCEDURE Image Quality: Good. - SUMMARY The left ventricular size is normal. There is normal left ventricular wall thickness.  Left ventricular systolic function is normal. LV ejection fraction = 64%.  Left ventricular filling pattern is prolonged relaxation. No segmental wall motion abnormalities seen in the left ventricle The right ventricle is normal size. The right ventricular systolic function is normal. There is no significant valvular stenosis or regurgitation. The ascending aorta is normal size. There is no pericardial effusion. There is no comparison study available. - FINDINGS:  LEFT VENTRICLE The left ventricular size is normal. There is normal left ventricular wall  thickness. Left ventricular systolic function is normal. LV ejection fraction  = 64%. Left ventricular filling pattern is prolonged relaxation. No segmental  wall motion abnormalities seen in the left ventricle. -  RIGHT VENTRICLE The right ventricle is normal size. The right ventricular systolic function  is normal.  LEFT ATRIUM The left atrial size is normal.  RIGHT ATRIUM  Right atrial size is normal. - AORTIC VALVE Structurally normal aortic valve. The aortic valve opens well. There is no  aortic stenosis. There is trace aortic regurgitation. - MITRAL VALVE The mitral valve leaflets appear normal. There is no mitral  regurgitation  noted. - TRICUSPID VALVE Structurally normal tricuspid valve. No tricuspid regurgitation. - PULMONIC VALVE Structurally normal pulmonic valve. There is no pulmonic valvular  regurgitation. - ARTERIES The ascending aorta is normal size. - VENOUS Pulmonary veins were not well visualized during exam. IVC size was normal. - EFFUSION There is no pericardial effusion. - - MMode/2D Measurements & Calculations IVSd: 0.65 cm LVIDd: 4.7 cm LVPWd: 0.65 cm LVIDs: 2.5 cm LA diam: 3.4 cm EDV(MOD-sp4): 88.1 ml ESV(MOD-sp4): 32.4 ml EDV(MOD-sp2): 86.3 ml ESV(MOD-sp2): 30.5 ml asc Aorta Diam: 3.3 cm SV(MOD-sp4): 55.7 ml SI(MOD-sp4): 28.6 ml/m2 IVC 1: 1.5 cm LA area A2: 14.1 cm2 LA area A4: 12.1 cm2 LA vol: 34.0 ml LA vol index: 17.5 ml/m2 RA area A4: 13.2 cm2 Doppler Measurements & Calculations MV E max vel: 73.9 cm/sec MV A max vel: 73.9 cm/sec MV E/A: 1.0 Med Peak E' Vel: 8.6 cm/sec Lat Peak E' Vel: 13.7 cm/sec E/Lat E`: 5.4 E/Med E`: 8.6 LV V1 VTI: 22.1 cm   Reading Physician: [place], [id number] 12/13/2018 11:24 AM   A/P:  1. DOE (dyspnea on exertion) - pt notes SOB with minimal exertion x 2 weeks - normal echo in 11/2018 - D-Dimer, Quantitative - ordered STAT today - could be d/t some physical deconditioning as pt has not been nearly as active secondary to DRESS syndrome diagnosed in 2020. Pt will increase his CV exercise - if d-dimer is negative, would consider pulmonary eval or further cardiac eval  2. Edema of left lower extremity - chronic x years but prior to the past 2 weeks, edema would improve. Over the past 2 weeks, LLE edema has been consistent - negative LLE venous duplex in 11/2018 and normal echo in 11/2018 - D-Dimer, Quantitative - ordered STAT today - recommended lasix  daily x 3-4 days but pt declines as he is hesitant to try a new med after DRESS syndrome secondary to bactrim and because he is scheduled for EGD on  03/13/19 and pt does not want to take a med ahead of this procedure despite my reassurance that I think it would be fine and  I would be willing to discuss with GI Dr. Marina Goodell - recommend pt elevated LLE when seating, resume his regular walking routine, reduce sodium intake - f/u with PCP if symptoms worsen or do not improve in 1-2 wks

## 2019-03-11 ENCOUNTER — Telehealth: Payer: Self-pay

## 2019-03-11 DIAGNOSIS — R7989 Other specified abnormal findings of blood chemistry: Secondary | ICD-10-CM

## 2019-03-11 DIAGNOSIS — R0609 Other forms of dyspnea: Secondary | ICD-10-CM

## 2019-03-11 DIAGNOSIS — R0600 Dyspnea, unspecified: Secondary | ICD-10-CM

## 2019-03-11 DIAGNOSIS — R600 Localized edema: Secondary | ICD-10-CM

## 2019-03-11 NOTE — Telephone Encounter (Signed)
Covid-19 travel screening questions  Have you traveled in the last 14 days?no If yes where?  Do you now or have you had a fever in the last 14 days?no  Do you have any respiratory symptoms of shortness of breath or cough now or in the last 14 days? Had DRESS syndrome in 11/2018, causing SOB  Do you have a medical history of Congestive Heart Failure?  Do you have a medical history of lung disease?  Do you have any family members or close contacts with diagnosed or suspected Covid-19? No  Pt aware of care partner policy and the need to wear a mask.  States he was checked out by his primary care doctor recently and all was well.

## 2019-03-12 NOTE — Telephone Encounter (Signed)
Yes.  I was aware when I saw him in the office recently.  Thanks for checking

## 2019-03-13 ENCOUNTER — Telehealth: Payer: Self-pay | Admitting: Family Medicine

## 2019-03-13 ENCOUNTER — Ambulatory Visit (AMBULATORY_SURGERY_CENTER): Payer: Medicare Other | Admitting: Internal Medicine

## 2019-03-13 ENCOUNTER — Encounter: Payer: Self-pay | Admitting: Internal Medicine

## 2019-03-13 ENCOUNTER — Other Ambulatory Visit: Payer: Self-pay | Admitting: Family Medicine

## 2019-03-13 ENCOUNTER — Other Ambulatory Visit: Payer: Self-pay

## 2019-03-13 VITALS — BP 125/75 | HR 71 | Temp 98.5°F | Resp 12 | Ht 67.0 in | Wt 163.0 lb

## 2019-03-13 DIAGNOSIS — R600 Localized edema: Secondary | ICD-10-CM

## 2019-03-13 DIAGNOSIS — R0600 Dyspnea, unspecified: Secondary | ICD-10-CM

## 2019-03-13 DIAGNOSIS — R0609 Other forms of dyspnea: Secondary | ICD-10-CM

## 2019-03-13 DIAGNOSIS — R1013 Epigastric pain: Secondary | ICD-10-CM | POA: Diagnosis not present

## 2019-03-13 DIAGNOSIS — K209 Esophagitis, unspecified without bleeding: Secondary | ICD-10-CM

## 2019-03-13 DIAGNOSIS — R7989 Other specified abnormal findings of blood chemistry: Secondary | ICD-10-CM

## 2019-03-13 MED ORDER — SODIUM CHLORIDE 0.9 % IV SOLN: 500.0000 mL | INTRAVENOUS | Status: DC

## 2019-03-13 NOTE — Progress Notes (Signed)
Courtney Washington took temp and Judy Branson took vitals. 

## 2019-03-13 NOTE — Telephone Encounter (Signed)
Somehow the message below and the corresponding order was entered into another telephone encounter of this patient with Frances Nickels, RN from 03/11/19. I have opened this telephone encounter and copied/pasted my documentation and documentation from [facility] , LPN below so there is a separate telephone encounter originated by me regarding this patient's results.    Mar 13, 2019  Prewette, Cordie Grice, LPN     8:78 AM  Note    Spoke with pt. Pt aware of the D-Dimer results and is aware to expect a call for CT of his lungs to be scheduled.         9:14 AM  You routed this conversation to Prewette, Cordie Grice, LPN  Me      6:76 AM  Note    Pt seen by me for chronic but worsening Lt LE edema and 2-3wk h/o SOB w/ minimal exertion. Neg LLE venous duplex and normal echo in 11/2018. At time of office visit, pt declined any medication to help with edema or imaging ([place], CTA) but did agree to lab to check d-dimer. D-dimer resulted and is slightly elevated at 0.95. recommend CTA chest to r/o PE. Order placed. Danielle, please call pt to make him aware that d-dimer is slightly elevated at 0.95 (normal is below 0.50) so I think he needs a CT of his chest to make sure he does not have a blood clot in his lung. STAT order placed and he will be called to schedule.

## 2019-03-13 NOTE — Patient Instructions (Signed)
Follow up with Dr Ashley Royalty (PCP) re: D Dimer test results.   YOU HAD AN ENDOSCOPIC PROCEDURE TODAY AT THE [facility]:   Refer to the procedure report that was given to you for any specific questions about what was found during the examination.  If the procedure report does not answer your questions, please call your gastroenterologist to clarify.  If you requested that your care partner not be given the details of your procedure findings, then the procedure report has been included in a sealed envelope for you to review at your convenience later.  YOU SHOULD EXPECT: Some feelings of bloating in the abdomen. Passage of more gas than usual.  Walking can help get rid of the air that was put into your GI tract during the procedure and reduce the bloating. If you had a lower endoscopy (such as a colonoscopy or flexible sigmoidoscopy) you may notice spotting of blood in your stool or on the toilet paper. If you underwent a bowel prep for your procedure, you may not have a normal bowel movement for a few days.  Please Note:  You might notice some irritation and congestion in your nose or some drainage.  This is from the oxygen used during your procedure.  There is no need for concern and it should clear up in a day or so.  SYMPTOMS TO REPORT IMMEDIATELY:   Following upper endoscopy (EGD)  Vomiting of blood or coffee ground material  New chest pain or pain under the shoulder blades  Painful or persistently difficult swallowing  New shortness of breath  Fever of 100F or higher  Black, tarry-looking stools  For urgent or emergent issues, a gastroenterologist can be reached at any hour by calling [phone].   DIET:  We do recommend a small meal at first, but then you may proceed to your regular diet.  Drink plenty of fluids but you should avoid alcoholic beverages for 24 hours.  ACTIVITY:  You should plan to take it easy for the rest of today and you should NOT DRIVE or use heavy  machinery until tomorrow (because of the sedation medicines used during the test).    FOLLOW UP: Our staff will call the number listed on your records 48-72 hours following your procedure to check on you and address any questions or concerns that you may have regarding the information given to you following your procedure. If we do not reach you, we will leave a message.  We will attempt to reach you two times.  During this call, we will ask if you have developed any symptoms of COVID 19. If you develop any symptoms (for example fever, flu-like symptoms, shortness of breath, cough etc.) before then, please call [phone].  If any biopsies were taken you will be contacted by phone or by letter within the next 1-3 weeks.  Please call us at [phone] if you have not heard about the biopsies in 3 weeks.    SIGNATURES/CONFIDENTIALITY: You and/or your care partner have signed paperwork which will be entered into your electronic medical record.  These signatures attest to the fact that that the information above on your After Visit Summary has been reviewed and is understood.  Full responsibility of the confidentiality of this discharge information lies with you and/or your care-partner.

## 2019-03-13 NOTE — Telephone Encounter (Signed)
Pt seen by me for chronic but worsening Lt LE edema and 2-3wk h/o SOB w/ minimal exertion. Neg LLE venous duplex and normal echo in 11/2018. At time of office visit, pt declined any medication to help with edema or imaging ([place], CTA) but did agree to lab to check d-dimer. D-dimer resulted and is slightly elevated at 0.95. recommend CTA chest to r/o PE. Order placed. Danielle, please call pt to make him aware that d-dimer is slightly elevated at 0.95 (normal is below 0.50) so I think he needs a CT of his chest to make sure he does not have a blood clot in his lung. STAT order placed and he will be called to schedule.

## 2019-03-13 NOTE — Telephone Encounter (Signed)
Spoke with pt. Pt aware of the D-Dimer results and is aware to expect a call for CT of his lungs to be scheduled.

## 2019-03-13 NOTE — Op Note (Signed)
[facility] Patient Name: Martin Mcdowell Procedure Date: 03/13/2019 3:27 PM MRN: [id number] Endoscopist: Wilhemina Bonito. Marina Goodell , MD Age: 68 Referring MD:  Date of Birth: [birth year] Gender: Male Account #: [id number] Procedure:                Upper GI endoscopy Indications:              Epigastric abdominal pain. Recovering from DRESS                            syndrome. Previous endoscopy elsewhere with severe                            esophagitis. Biopsies with benign inflammation.                            Follow-up endoscopy to document healing                            recommended. His epigastric symptoms have improved                            on prescribed PPI Medicines:                Monitored Anesthesia Care Procedure:                Pre-Anesthesia Assessment:                           - Prior to the procedure, a History and Physical                            was performed, and patient medications and                            allergies were reviewed. The patient's tolerance of                            previous anesthesia was also reviewed. The risks                            and benefits of the procedure and the sedation                            options and risks were discussed with the patient.                            All questions were answered, and informed consent                            was obtained. Prior Anticoagulants: The patient has                            taken no previous anticoagulant or antiplatelet  agents. ASA Grade Assessment: II - A patient with                            mild systemic disease. After reviewing the risks                            and benefits, the patient was deemed in                            satisfactory condition to undergo the procedure.                           After obtaining informed consent, the endoscope was                            passed under direct vision. Throughout the                        procedure, the patient's blood pressure, pulse, and                            oxygen saturations were monitored continuously. The                            Endoscope was introduced through the mouth, and                            advanced to the second part of duodenum. The upper                            GI endoscopy was accomplished without difficulty.                            The patient tolerated the procedure well. Scope In: Scope Out: Findings:                 The esophagus was normal.                           The stomach was normal.                           The examined duodenum was normal.                           The cardia and gastric fundus were normal on                            retroflexion. Complications:            No immediate complications. Estimated Blood Loss:     Estimated blood loss: none. Impression:               - Normal esophagus.                           - Normal stomach.                           -  Normal examined duodenum.                           - No specimens collected. Recommendation:           - Patient has a contact number available for                            emergencies. The signs and symptoms of potential                            delayed complications were discussed with the                            patient. Return to normal activities tomorrow.                            Written discharge instructions were provided to the                            patient.                           - Resume previous diet.                           - Continue present medications.                           **PATIENT AND WIFE ADVISED TO CONTACT PCP office                            regarding elevated d-dimer blood test from May 15                            and to obtain specifics regarding planned CTA of                            the chest (radiology test) Wilhemina Bonito. Marina Goodell, MD 03/13/2019 4:01:54 PM This report has been signed  electronically.

## 2019-03-13 NOTE — Progress Notes (Signed)
PT taken to PACU. Monitors in place. VSS. Report given to RN. 

## 2019-03-13 NOTE — Telephone Encounter (Signed)
Copied from CRM [id number]. Topic: Quick Communication - See Telephone Encounter >> Mar 13, 2019  4:21 PM Jens Som A wrote: CRM for notification. See Telephone encounter for: 03/13/19.  [facility]. CT Scan has not been scheduled. And is need labs. Requesting labs to be taken. An order has been placed. Can patient be contacted to scheduled labs for tomorrow.  Needing an updated B Un-Creatine. Ordered Stat labs. Please advise Wife [phone]

## 2019-03-14 ENCOUNTER — Encounter: Payer: Self-pay | Admitting: Family Medicine

## 2019-03-14 ENCOUNTER — Other Ambulatory Visit: Payer: Self-pay | Admitting: Family Medicine

## 2019-03-14 DIAGNOSIS — T50905A Adverse effect of unspecified drugs, medicaments and biological substances, initial encounter: Secondary | ICD-10-CM | POA: Diagnosis not present

## 2019-03-14 DIAGNOSIS — L299 Pruritus, unspecified: Secondary | ICD-10-CM | POA: Diagnosis not present

## 2019-03-14 DIAGNOSIS — B001 Herpesviral vesicular dermatitis: Secondary | ICD-10-CM | POA: Diagnosis not present

## 2019-03-14 DIAGNOSIS — M6281 Muscle weakness (generalized): Secondary | ICD-10-CM | POA: Diagnosis not present

## 2019-03-14 DIAGNOSIS — L270 Generalized skin eruption due to drugs and medicaments taken internally: Secondary | ICD-10-CM | POA: Diagnosis not present

## 2019-03-14 DIAGNOSIS — D721 Eosinophilia: Secondary | ICD-10-CM | POA: Diagnosis not present

## 2019-03-14 DIAGNOSIS — R7989 Other specified abnormal findings of blood chemistry: Secondary | ICD-10-CM

## 2019-03-14 NOTE — Telephone Encounter (Signed)
Pt is aware.  

## 2019-03-14 NOTE — Telephone Encounter (Signed)
Order for BMP entered

## 2019-03-15 ENCOUNTER — Encounter (HOSPITAL_BASED_OUTPATIENT_CLINIC_OR_DEPARTMENT_OTHER): Payer: Self-pay

## 2019-03-15 ENCOUNTER — Other Ambulatory Visit: Payer: Self-pay

## 2019-03-15 ENCOUNTER — Telehealth: Payer: Self-pay | Admitting: *Deleted

## 2019-03-15 ENCOUNTER — Ambulatory Visit (HOSPITAL_BASED_OUTPATIENT_CLINIC_OR_DEPARTMENT_OTHER)
Admission: RE | Admit: 2019-03-15 | Discharge: 2019-03-15 | Disposition: A | Discharge status: Home / Self Care | Payer: Medicare Other | Source: Ambulatory Visit | Attending: Family Medicine | Admitting: Family Medicine

## 2019-03-15 ENCOUNTER — Other Ambulatory Visit (INDEPENDENT_AMBULATORY_CARE_PROVIDER_SITE_OTHER): Payer: Medicare Other

## 2019-03-15 DIAGNOSIS — R0600 Dyspnea, unspecified: Secondary | ICD-10-CM

## 2019-03-15 DIAGNOSIS — R0609 Other forms of dyspnea: Secondary | ICD-10-CM | POA: Diagnosis not present

## 2019-03-15 DIAGNOSIS — R600 Localized edema: Secondary | ICD-10-CM | POA: Diagnosis not present

## 2019-03-15 DIAGNOSIS — R7989 Other specified abnormal findings of blood chemistry: Secondary | ICD-10-CM | POA: Diagnosis not present

## 2019-03-15 DIAGNOSIS — R0602 Shortness of breath: Secondary | ICD-10-CM | POA: Diagnosis not present

## 2019-03-15 LAB — BASIC METABOLIC PANEL
BUN: 19 mg/dL (ref 6–23)
CO2: 25 mEq/L (ref 19–32)
Calcium: 8.8 mg/dL (ref 8.4–10.5)
Chloride: 109 mEq/L (ref 96–112)
Creatinine, Ser: 0.98 mg/dL (ref 0.40–1.50)
GFR: 76.43 mL/min (ref >60.00)
Glucose, Bld: 110 mg/dL — ABNORMAL HIGH (ref 70–99)
Potassium: 4.2 mEq/L (ref 3.5–5.1)
Sodium: 141 mEq/L (ref 135–145)

## 2019-03-15 MED ORDER — IOHEXOL 350 MG/ML SOLN
100.0000 mL | Freq: Once | INTRAVENOUS | Status: AC | PRN
  Administered 2019-03-15: 79 mL via INTRAVENOUS

## 2019-03-15 NOTE — Telephone Encounter (Signed)
  Follow up Call-  Call back number 03/13/2019  Post procedure Call Back phone  # [phone] or [phone]  Permission to leave phone message Yes  Some recent data might be hidden     Patient questions:  Do you have a fever, pain , or abdominal swelling? No. Pain Score  0 *  Have you tolerated food without any problems? Yes.    Have you been able to return to your normal activities? Yes.    Do you have any questions about your discharge instructions: Diet   No. Medications  No. Follow up visit  No.  Do you have questions or concerns about your Care? No.  Actions: * If pain score is 4 or above: No action needed, pain <4.

## 2019-03-21 ENCOUNTER — Encounter: Payer: Medicare Other | Admitting: Internal Medicine

## 2019-04-10 DIAGNOSIS — G729 Myopathy, unspecified: Secondary | ICD-10-CM | POA: Diagnosis not present

## 2019-04-15 DIAGNOSIS — Q332 Sequestration of lung: Secondary | ICD-10-CM | POA: Diagnosis not present

## 2019-05-15 DIAGNOSIS — H35362 Drusen (degenerative) of macula, left eye: Secondary | ICD-10-CM | POA: Diagnosis not present

## 2019-05-15 DIAGNOSIS — B5801 Toxoplasma chorioretinitis: Secondary | ICD-10-CM | POA: Diagnosis not present

## 2019-05-15 DIAGNOSIS — H43811 Vitreous degeneration, right eye: Secondary | ICD-10-CM | POA: Diagnosis not present

## 2019-05-15 DIAGNOSIS — H35371 Puckering of macula, right eye: Secondary | ICD-10-CM | POA: Diagnosis not present

## 2019-05-21 DIAGNOSIS — T148XXA Other injury of unspecified body region, initial encounter: Secondary | ICD-10-CM | POA: Diagnosis not present

## 2019-05-21 DIAGNOSIS — T50905A Adverse effect of unspecified drugs, medicaments and biological substances, initial encounter: Secondary | ICD-10-CM | POA: Diagnosis not present

## 2019-05-21 DIAGNOSIS — D649 Anemia, unspecified: Secondary | ICD-10-CM | POA: Diagnosis not present

## 2019-05-21 DIAGNOSIS — L299 Pruritus, unspecified: Secondary | ICD-10-CM | POA: Diagnosis not present

## 2019-05-21 DIAGNOSIS — L270 Generalized skin eruption due to drugs and medicaments taken internally: Secondary | ICD-10-CM | POA: Diagnosis not present

## 2019-05-21 DIAGNOSIS — D721 Eosinophilia: Secondary | ICD-10-CM | POA: Diagnosis not present

## 2019-06-24 DIAGNOSIS — Z0000 Encounter for general adult medical examination without abnormal findings: Secondary | ICD-10-CM | POA: Diagnosis not present

## 2019-06-24 DIAGNOSIS — K449 Diaphragmatic hernia without obstruction or gangrene: Secondary | ICD-10-CM | POA: Diagnosis not present

## 2019-06-24 DIAGNOSIS — Z6825 Body mass index (BMI) 25.0-25.9, adult: Secondary | ICD-10-CM | POA: Diagnosis not present

## 2019-06-24 DIAGNOSIS — K219 Gastro-esophageal reflux disease without esophagitis: Secondary | ICD-10-CM | POA: Diagnosis not present

## 2019-06-24 DIAGNOSIS — L270 Generalized skin eruption due to drugs and medicaments taken internally: Secondary | ICD-10-CM | POA: Diagnosis not present

## 2019-06-24 DIAGNOSIS — D721 Eosinophilia: Secondary | ICD-10-CM | POA: Diagnosis not present

## 2019-06-24 DIAGNOSIS — E663 Overweight: Secondary | ICD-10-CM | POA: Diagnosis not present

## 2019-06-24 DIAGNOSIS — T50905A Adverse effect of unspecified drugs, medicaments and biological substances, initial encounter: Secondary | ICD-10-CM | POA: Diagnosis not present

## 2019-06-24 DIAGNOSIS — Z008 Encounter for other general examination: Secondary | ICD-10-CM | POA: Diagnosis not present

## 2019-06-24 DIAGNOSIS — I10 Essential (primary) hypertension: Secondary | ICD-10-CM | POA: Diagnosis not present

## 2019-06-24 DIAGNOSIS — R911 Solitary pulmonary nodule: Secondary | ICD-10-CM | POA: Diagnosis not present

## 2019-07-24 DIAGNOSIS — Z125 Encounter for screening for malignant neoplasm of prostate: Secondary | ICD-10-CM | POA: Diagnosis not present

## 2019-07-24 DIAGNOSIS — Z008 Encounter for other general examination: Secondary | ICD-10-CM | POA: Diagnosis not present

## 2019-07-24 DIAGNOSIS — N529 Male erectile dysfunction, unspecified: Secondary | ICD-10-CM | POA: Diagnosis not present

## 2019-07-24 DIAGNOSIS — I10 Essential (primary) hypertension: Secondary | ICD-10-CM | POA: Diagnosis not present

## 2019-07-24 DIAGNOSIS — Z1159 Encounter for screening for other viral diseases: Secondary | ICD-10-CM | POA: Diagnosis not present

## 2019-08-08 DIAGNOSIS — Z1211 Encounter for screening for malignant neoplasm of colon: Secondary | ICD-10-CM | POA: Diagnosis not present

## 2019-08-16 ENCOUNTER — Encounter: Payer: Self-pay | Admitting: Family Medicine

## 2019-08-19 DIAGNOSIS — N529 Male erectile dysfunction, unspecified: Secondary | ICD-10-CM | POA: Diagnosis not present

## 2019-08-19 DIAGNOSIS — Z6825 Body mass index (BMI) 25.0-25.9, adult: Secondary | ICD-10-CM | POA: Diagnosis not present

## 2019-08-19 DIAGNOSIS — T50905A Adverse effect of unspecified drugs, medicaments and biological substances, initial encounter: Secondary | ICD-10-CM | POA: Diagnosis not present

## 2019-08-19 DIAGNOSIS — L270 Generalized skin eruption due to drugs and medicaments taken internally: Secondary | ICD-10-CM | POA: Diagnosis not present

## 2019-08-19 DIAGNOSIS — I10 Essential (primary) hypertension: Secondary | ICD-10-CM | POA: Diagnosis not present

## 2019-08-19 DIAGNOSIS — R911 Solitary pulmonary nodule: Secondary | ICD-10-CM | POA: Diagnosis not present

## 2019-08-19 DIAGNOSIS — E663 Overweight: Secondary | ICD-10-CM | POA: Diagnosis not present

## 2019-08-19 DIAGNOSIS — R319 Hematuria, unspecified: Secondary | ICD-10-CM | POA: Diagnosis not present

## 2019-08-22 DIAGNOSIS — S80812A Abrasion, left lower leg, initial encounter: Secondary | ICD-10-CM | POA: Diagnosis not present

## 2019-08-22 DIAGNOSIS — S80811A Abrasion, right lower leg, initial encounter: Secondary | ICD-10-CM | POA: Diagnosis not present

## 2019-08-22 DIAGNOSIS — L299 Pruritus, unspecified: Secondary | ICD-10-CM | POA: Diagnosis not present

## 2019-08-22 DIAGNOSIS — D7212 Drug rash with eosinophilia and systemic symptoms syndrome: Secondary | ICD-10-CM | POA: Diagnosis not present

## 2019-08-22 DIAGNOSIS — T50905A Adverse effect of unspecified drugs, medicaments and biological substances, initial encounter: Secondary | ICD-10-CM | POA: Diagnosis not present

## 2019-09-18 DIAGNOSIS — Z008 Encounter for other general examination: Secondary | ICD-10-CM | POA: Diagnosis not present

## 2019-09-18 DIAGNOSIS — I10 Essential (primary) hypertension: Secondary | ICD-10-CM | POA: Diagnosis not present

## 2019-09-18 DIAGNOSIS — Z0000 Encounter for general adult medical examination without abnormal findings: Secondary | ICD-10-CM | POA: Diagnosis not present

## 2019-09-18 DIAGNOSIS — K219 Gastro-esophageal reflux disease without esophagitis: Secondary | ICD-10-CM | POA: Diagnosis not present

## 2019-09-18 DIAGNOSIS — D721 Eosinophilia, unspecified: Secondary | ICD-10-CM | POA: Diagnosis not present

## 2019-09-18 DIAGNOSIS — T50905A Adverse effect of unspecified drugs, medicaments and biological substances, initial encounter: Secondary | ICD-10-CM | POA: Diagnosis not present

## 2019-09-18 DIAGNOSIS — J439 Emphysema, unspecified: Secondary | ICD-10-CM | POA: Diagnosis not present

## 2019-09-18 DIAGNOSIS — L270 Generalized skin eruption due to drugs and medicaments taken internally: Secondary | ICD-10-CM | POA: Diagnosis not present

## 2019-09-18 DIAGNOSIS — N529 Male erectile dysfunction, unspecified: Secondary | ICD-10-CM | POA: Diagnosis not present

## 2019-12-26 DIAGNOSIS — H35362 Drusen (degenerative) of macula, left eye: Secondary | ICD-10-CM | POA: Diagnosis not present

## 2019-12-26 DIAGNOSIS — B5801 Toxoplasma chorioretinitis: Secondary | ICD-10-CM | POA: Diagnosis not present

## 2019-12-26 DIAGNOSIS — H43811 Vitreous degeneration, right eye: Secondary | ICD-10-CM | POA: Diagnosis not present

## 2019-12-26 DIAGNOSIS — H40051 Ocular hypertension, right eye: Secondary | ICD-10-CM | POA: Diagnosis not present

## 2019-12-26 DIAGNOSIS — H35371 Puckering of macula, right eye: Secondary | ICD-10-CM | POA: Diagnosis not present

## 2019-12-30 DIAGNOSIS — H43811 Vitreous degeneration, right eye: Secondary | ICD-10-CM | POA: Diagnosis not present

## 2019-12-30 DIAGNOSIS — H35371 Puckering of macula, right eye: Secondary | ICD-10-CM | POA: Diagnosis not present

## 2019-12-30 DIAGNOSIS — B5801 Toxoplasma chorioretinitis: Secondary | ICD-10-CM | POA: Diagnosis not present

## 2019-12-30 DIAGNOSIS — H40051 Ocular hypertension, right eye: Secondary | ICD-10-CM | POA: Diagnosis not present

## 2020-01-02 DIAGNOSIS — H43821 Vitreomacular adhesion, right eye: Secondary | ICD-10-CM | POA: Diagnosis not present

## 2020-01-02 DIAGNOSIS — B5801 Toxoplasma chorioretinitis: Secondary | ICD-10-CM | POA: Diagnosis not present

## 2020-01-02 DIAGNOSIS — H35362 Drusen (degenerative) of macula, left eye: Secondary | ICD-10-CM | POA: Diagnosis not present

## 2020-01-02 DIAGNOSIS — H40051 Ocular hypertension, right eye: Secondary | ICD-10-CM | POA: Diagnosis not present

## 2020-01-09 DIAGNOSIS — B5801 Toxoplasma chorioretinitis: Secondary | ICD-10-CM | POA: Diagnosis not present

## 2020-01-09 DIAGNOSIS — H43391 Other vitreous opacities, right eye: Secondary | ICD-10-CM | POA: Diagnosis not present

## 2020-01-09 DIAGNOSIS — H35371 Puckering of macula, right eye: Secondary | ICD-10-CM | POA: Diagnosis not present

## 2020-01-09 DIAGNOSIS — H43811 Vitreous degeneration, right eye: Secondary | ICD-10-CM | POA: Diagnosis not present

## 2020-01-23 DIAGNOSIS — H43391 Other vitreous opacities, right eye: Secondary | ICD-10-CM | POA: Diagnosis not present

## 2020-01-23 DIAGNOSIS — H35371 Puckering of macula, right eye: Secondary | ICD-10-CM | POA: Diagnosis not present

## 2020-01-23 DIAGNOSIS — B5801 Toxoplasma chorioretinitis: Secondary | ICD-10-CM | POA: Diagnosis not present

## 2020-01-23 DIAGNOSIS — H43811 Vitreous degeneration, right eye: Secondary | ICD-10-CM | POA: Diagnosis not present

## 2020-02-05 DIAGNOSIS — B5801 Toxoplasma chorioretinitis: Secondary | ICD-10-CM | POA: Diagnosis not present

## 2020-02-05 DIAGNOSIS — H43811 Vitreous degeneration, right eye: Secondary | ICD-10-CM | POA: Diagnosis not present

## 2020-02-05 DIAGNOSIS — H43391 Other vitreous opacities, right eye: Secondary | ICD-10-CM | POA: Diagnosis not present

## 2020-02-05 DIAGNOSIS — H35371 Puckering of macula, right eye: Secondary | ICD-10-CM | POA: Diagnosis not present

## 2020-02-06 DIAGNOSIS — B5801 Toxoplasma chorioretinitis: Secondary | ICD-10-CM | POA: Diagnosis not present

## 2020-02-13 DIAGNOSIS — H43811 Vitreous degeneration, right eye: Secondary | ICD-10-CM | POA: Diagnosis not present

## 2020-02-13 DIAGNOSIS — H35362 Drusen (degenerative) of macula, left eye: Secondary | ICD-10-CM | POA: Diagnosis not present

## 2020-02-13 DIAGNOSIS — B5801 Toxoplasma chorioretinitis: Secondary | ICD-10-CM | POA: Diagnosis not present

## 2020-02-13 DIAGNOSIS — H35371 Puckering of macula, right eye: Secondary | ICD-10-CM | POA: Diagnosis not present

## 2020-02-19 DIAGNOSIS — H35371 Puckering of macula, right eye: Secondary | ICD-10-CM | POA: Diagnosis not present

## 2020-02-19 DIAGNOSIS — H43811 Vitreous degeneration, right eye: Secondary | ICD-10-CM | POA: Diagnosis not present

## 2020-02-19 DIAGNOSIS — B5801 Toxoplasma chorioretinitis: Secondary | ICD-10-CM | POA: Diagnosis not present

## 2020-02-19 DIAGNOSIS — H43391 Other vitreous opacities, right eye: Secondary | ICD-10-CM | POA: Diagnosis not present

## 2020-02-20 DIAGNOSIS — B5801 Toxoplasma chorioretinitis: Secondary | ICD-10-CM | POA: Diagnosis not present

## 2020-03-26 DIAGNOSIS — H35371 Puckering of macula, right eye: Secondary | ICD-10-CM | POA: Diagnosis not present

## 2020-03-26 DIAGNOSIS — H35362 Drusen (degenerative) of macula, left eye: Secondary | ICD-10-CM | POA: Diagnosis not present

## 2020-03-26 DIAGNOSIS — B5801 Toxoplasma chorioretinitis: Secondary | ICD-10-CM | POA: Diagnosis not present

## 2020-04-03 DIAGNOSIS — H35371 Puckering of macula, right eye: Secondary | ICD-10-CM | POA: Diagnosis not present

## 2020-04-03 DIAGNOSIS — H35362 Drusen (degenerative) of macula, left eye: Secondary | ICD-10-CM | POA: Diagnosis not present

## 2020-04-03 DIAGNOSIS — H43811 Vitreous degeneration, right eye: Secondary | ICD-10-CM | POA: Diagnosis not present

## 2020-04-03 DIAGNOSIS — B5801 Toxoplasma chorioretinitis: Secondary | ICD-10-CM | POA: Diagnosis not present

## 2020-05-13 ENCOUNTER — Other Ambulatory Visit: Payer: Self-pay

## 2020-05-13 ENCOUNTER — Emergency Department (HOSPITAL_COMMUNITY)
Admission: EM | Admit: 2020-05-13 | Discharge: 2020-05-14 | Disposition: A | Discharge status: Home / Self Care | Payer: Medicare Other | Source: Home / Self Care

## 2020-05-13 ENCOUNTER — Emergency Department (HOSPITAL_COMMUNITY): Payer: Medicare Other

## 2020-05-13 ENCOUNTER — Encounter (HOSPITAL_COMMUNITY): Payer: Self-pay | Admitting: Emergency Medicine

## 2020-05-13 DIAGNOSIS — R42 Dizziness and giddiness: Secondary | ICD-10-CM | POA: Insufficient documentation

## 2020-05-13 DIAGNOSIS — Z5321 Procedure and treatment not carried out due to patient leaving prior to being seen by health care provider: Secondary | ICD-10-CM | POA: Insufficient documentation

## 2020-05-13 DIAGNOSIS — R231 Pallor: Secondary | ICD-10-CM | POA: Insufficient documentation

## 2020-05-13 DIAGNOSIS — R079 Chest pain, unspecified: Secondary | ICD-10-CM | POA: Insufficient documentation

## 2020-05-13 LAB — BASIC METABOLIC PANEL
Anion gap: 10 (ref 5–15)
BUN: 25 mg/dL — ABNORMAL HIGH (ref 8–23)
CO2: 28 mmol/L (ref 22–32)
Calcium: 9.2 mg/dL (ref 8.9–10.3)
Chloride: 107 mmol/L (ref 98–111)
Creatinine, Ser: 1.39 mg/dL — ABNORMAL HIGH (ref 0.61–1.24)
GFR calc Af Amer: >60 mL/min (ref >60)
GFR calc non Af Amer: 52 mL/min — ABNORMAL LOW (ref >60)
Glucose, Bld: 93 mg/dL (ref 70–99)
Potassium: 3.9 mmol/L (ref 3.5–5.1)
Sodium: 145 mmol/L (ref 135–145)

## 2020-05-13 LAB — CBC
HCT: 40.5 % (ref 39.0–52.0)
Hemoglobin: 12.2 g/dL — ABNORMAL LOW (ref 13.0–17.0)
MCH: 20.2 pg — ABNORMAL LOW (ref 26.0–34.0)
MCHC: 30.1 g/dL (ref 30.0–36.0)
MCV: 67.2 fL — ABNORMAL LOW (ref 80.0–100.0)
Platelets: 299 10*3/uL (ref 150–400)
RBC: 6.03 MIL/uL — ABNORMAL HIGH (ref 4.22–5.81)
RDW: 17.5 % — ABNORMAL HIGH (ref 11.5–15.5)
WBC: 10.8 10*3/uL — ABNORMAL HIGH (ref 4.0–10.5)
nRBC: 0 % (ref 0.0–0.2)

## 2020-05-13 LAB — TROPONIN I (HIGH SENSITIVITY): Troponin I (High Sensitivity): 6 ng/L (ref <18)

## 2020-05-13 MED ORDER — SODIUM CHLORIDE 0.9% FLUSH: 3.0000 mL | Freq: Once | INTRAVENOUS | Status: DC

## 2020-05-13 NOTE — ED Triage Notes (Signed)
Pt reports substernal chest "severe ache that seems to be moving to my left arm"  X15 minutes.   Wife reports pt was clammy and dizzy.

## 2020-05-14 NOTE — ED Notes (Signed)
Pt checked out AMA. 

## 2020-05-15 ENCOUNTER — Encounter (HOSPITAL_COMMUNITY)
Admission: EM | Disposition: A | Discharge status: Home / Self Care | Payer: Self-pay | Source: Home / Self Care | Attending: Interventional Cardiology

## 2020-05-15 ENCOUNTER — Other Ambulatory Visit: Payer: Self-pay

## 2020-05-15 ENCOUNTER — Other Ambulatory Visit (HOSPITAL_COMMUNITY): Payer: Medicare Other

## 2020-05-15 ENCOUNTER — Inpatient Hospital Stay (HOSPITAL_COMMUNITY)
Admission: EM | Admit: 2020-05-15 | Discharge: 2020-05-19 | DRG: 246 | Disposition: A | Discharge status: Home / Self Care | Payer: Medicare Other | Attending: Interventional Cardiology | Admitting: Interventional Cardiology

## 2020-05-15 ENCOUNTER — Encounter (HOSPITAL_COMMUNITY): Payer: Self-pay | Admitting: Obstetrics and Gynecology

## 2020-05-15 DIAGNOSIS — I959 Hypotension, unspecified: Secondary | ICD-10-CM | POA: Diagnosis not present

## 2020-05-15 DIAGNOSIS — K219 Gastro-esophageal reflux disease without esophagitis: Secondary | ICD-10-CM | POA: Diagnosis present

## 2020-05-15 DIAGNOSIS — M7989 Other specified soft tissue disorders: Secondary | ICD-10-CM | POA: Diagnosis present

## 2020-05-15 DIAGNOSIS — D7212 Drug rash with eosinophilia and systemic symptoms syndrome: Secondary | ICD-10-CM | POA: Diagnosis present

## 2020-05-15 DIAGNOSIS — Z20822 Contact with and (suspected) exposure to covid-19: Secondary | ICD-10-CM | POA: Diagnosis present

## 2020-05-15 DIAGNOSIS — Z87892 Personal history of anaphylaxis: Secondary | ICD-10-CM | POA: Diagnosis not present

## 2020-05-15 DIAGNOSIS — I110 Hypertensive heart disease with heart failure: Secondary | ICD-10-CM | POA: Diagnosis present

## 2020-05-15 DIAGNOSIS — E785 Hyperlipidemia, unspecified: Secondary | ICD-10-CM | POA: Diagnosis not present

## 2020-05-15 DIAGNOSIS — I25118 Atherosclerotic heart disease of native coronary artery with other forms of angina pectoris: Secondary | ICD-10-CM | POA: Diagnosis not present

## 2020-05-15 DIAGNOSIS — Z87891 Personal history of nicotine dependence: Secondary | ICD-10-CM

## 2020-05-15 DIAGNOSIS — I5021 Acute systolic (congestive) heart failure: Secondary | ICD-10-CM | POA: Diagnosis present

## 2020-05-15 DIAGNOSIS — I255 Ischemic cardiomyopathy: Secondary | ICD-10-CM | POA: Diagnosis present

## 2020-05-15 DIAGNOSIS — I2102 ST elevation (STEMI) myocardial infarction involving left anterior descending coronary artery: Secondary | ICD-10-CM | POA: Diagnosis not present

## 2020-05-15 DIAGNOSIS — Z79899 Other long term (current) drug therapy: Secondary | ICD-10-CM

## 2020-05-15 DIAGNOSIS — Z7952 Long term (current) use of systemic steroids: Secondary | ICD-10-CM | POA: Diagnosis not present

## 2020-05-15 DIAGNOSIS — I10 Essential (primary) hypertension: Secondary | ICD-10-CM | POA: Diagnosis not present

## 2020-05-15 DIAGNOSIS — I213 ST elevation (STEMI) myocardial infarction of unspecified site: Secondary | ICD-10-CM | POA: Diagnosis not present

## 2020-05-15 DIAGNOSIS — I2510 Atherosclerotic heart disease of native coronary artery without angina pectoris: Secondary | ICD-10-CM

## 2020-05-15 DIAGNOSIS — Z882 Allergy status to sulfonamides status: Secondary | ICD-10-CM

## 2020-05-15 DIAGNOSIS — I2582 Chronic total occlusion of coronary artery: Secondary | ICD-10-CM | POA: Diagnosis present

## 2020-05-15 DIAGNOSIS — Z955 Presence of coronary angioplasty implant and graft: Secondary | ICD-10-CM

## 2020-05-15 DIAGNOSIS — I25110 Atherosclerotic heart disease of native coronary artery with unstable angina pectoris: Secondary | ICD-10-CM | POA: Diagnosis not present

## 2020-05-15 HISTORY — PX: CORONARY/GRAFT ACUTE MI REVASCULARIZATION: CATH118305

## 2020-05-15 LAB — CBC
HCT: 40.1 % (ref 39.0–52.0)
Hemoglobin: 12 g/dL — ABNORMAL LOW (ref 13.0–17.0)
MCH: 19.6 pg — ABNORMAL LOW (ref 26.0–34.0)
MCHC: 29.9 g/dL — ABNORMAL LOW (ref 30.0–36.0)
MCV: 65.6 fL — ABNORMAL LOW (ref 80.0–100.0)
Platelets: 266 10*3/uL (ref 150–400)
RBC: 6.11 MIL/uL — ABNORMAL HIGH (ref 4.22–5.81)
RDW: 17.4 % — ABNORMAL HIGH (ref 11.5–15.5)
WBC: 11.1 10*3/uL — ABNORMAL HIGH (ref 4.0–10.5)
nRBC: 0 % (ref 0.0–0.2)

## 2020-05-15 LAB — CBC WITH DIFFERENTIAL/PLATELET
Abs Immature Granulocytes: 0.11 10*3/uL — ABNORMAL HIGH (ref 0.00–0.07)
Basophils Absolute: 0 10*3/uL (ref 0.0–0.1)
Basophils Relative: 0 %
Eosinophils Absolute: 0.2 10*3/uL (ref 0.0–0.5)
Eosinophils Relative: 2 %
HCT: 43.5 % (ref 39.0–52.0)
Hemoglobin: 13.1 g/dL (ref 13.0–17.0)
Immature Granulocytes: 1 %
Lymphocytes Relative: 25 %
Lymphs Abs: 3.2 10*3/uL (ref 0.7–4.0)
MCH: 20.3 pg — ABNORMAL LOW (ref 26.0–34.0)
MCHC: 30.1 g/dL (ref 30.0–36.0)
MCV: 67.5 fL — ABNORMAL LOW (ref 80.0–100.0)
Monocytes Absolute: 0.9 10*3/uL (ref 0.1–1.0)
Monocytes Relative: 7 %
Neutro Abs: 8 10*3/uL — ABNORMAL HIGH (ref 1.7–7.7)
Neutrophils Relative %: 65 %
Platelets: 331 10*3/uL (ref 150–400)
RBC: 6.44 MIL/uL — ABNORMAL HIGH (ref 4.22–5.81)
RDW: 18.4 % — ABNORMAL HIGH (ref 11.5–15.5)
WBC: 12.4 10*3/uL — ABNORMAL HIGH (ref 4.0–10.5)
nRBC: 0 % (ref 0.0–0.2)

## 2020-05-15 LAB — COMPREHENSIVE METABOLIC PANEL
ALT: 50 U/L — ABNORMAL HIGH (ref 0–44)
AST: 17 U/L (ref 15–41)
Albumin: 3.9 g/dL (ref 3.5–5.0)
Alkaline Phosphatase: 84 U/L (ref 38–126)
Anion gap: 11 (ref 5–15)
BUN: 21 mg/dL (ref 8–23)
CO2: 25 mmol/L (ref 22–32)
Calcium: 9.5 mg/dL (ref 8.9–10.3)
Chloride: 108 mmol/L (ref 98–111)
Creatinine, Ser: 1.02 mg/dL (ref 0.61–1.24)
GFR calc Af Amer: >60 mL/min (ref >60)
GFR calc non Af Amer: >60 mL/min (ref >60)
Glucose, Bld: 155 mg/dL — ABNORMAL HIGH (ref 70–99)
Potassium: 3.7 mmol/L (ref 3.5–5.1)
Sodium: 144 mmol/L (ref 135–145)
Total Bilirubin: 1.3 mg/dL — ABNORMAL HIGH (ref 0.3–1.2)
Total Protein: 7 g/dL (ref 6.5–8.1)

## 2020-05-15 LAB — LIPID PANEL
Cholesterol: 242 mg/dL — ABNORMAL HIGH (ref 0–200)
HDL: 45 mg/dL (ref >40)
LDL Cholesterol: 168 mg/dL — ABNORMAL HIGH (ref 0–99)
Total CHOL/HDL Ratio: 5.4 RATIO
Triglycerides: 146 mg/dL (ref <150)
VLDL: 29 mg/dL (ref 0–40)

## 2020-05-15 LAB — HIV ANTIBODY (ROUTINE TESTING W REFLEX): HIV Screen 4th Generation wRfx: NONREACTIVE

## 2020-05-15 LAB — CREATININE, SERUM
Creatinine, Ser: 1.07 mg/dL (ref 0.61–1.24)
GFR calc Af Amer: >60 mL/min (ref >60)
GFR calc non Af Amer: >60 mL/min (ref >60)

## 2020-05-15 LAB — D-DIMER, QUANTITATIVE: D-Dimer, Quant: 0.4 ug/mL-FEU (ref 0.00–0.50)

## 2020-05-15 LAB — PROTIME-INR
INR: 0.9 (ref 0.8–1.2)
Prothrombin Time: 12.2 seconds (ref 11.4–15.2)

## 2020-05-15 LAB — TROPONIN I (HIGH SENSITIVITY)
Troponin I (High Sensitivity): 29 ng/L — ABNORMAL HIGH (ref <18)
Troponin I (High Sensitivity): 26 ng/L — ABNORMAL HIGH (ref <18)

## 2020-05-15 LAB — HEMOGLOBIN A1C
Hgb A1c MFr Bld: 5.8 % — ABNORMAL HIGH (ref 4.8–5.6)
Mean Plasma Glucose: 119.76 mg/dL

## 2020-05-15 LAB — POCT ACTIVATED CLOTTING TIME: Activated Clotting Time: 368 seconds

## 2020-05-15 LAB — MRSA PCR SCREENING: MRSA by PCR: NEGATIVE

## 2020-05-15 LAB — SARS CORONAVIRUS 2 BY RT PCR (HOSPITAL ORDER, PERFORMED IN ~~LOC~~ HOSPITAL LAB): SARS Coronavirus 2: NEGATIVE

## 2020-05-15 LAB — APTT: aPTT: 27 seconds (ref 24–36)

## 2020-05-15 SURGERY — CORONARY/GRAFT ACUTE MI REVASCULARIZATION
Anesthesia: LOCAL

## 2020-05-15 MED ORDER — SODIUM CHLORIDE 0.9 % IV SOLN: 250.0000 mL | INTRAVENOUS | Status: DC | PRN

## 2020-05-15 MED ORDER — ACETAMINOPHEN 325 MG PO TABS: 650.0000 mg | ORAL_TABLET | ORAL | Status: DC | PRN

## 2020-05-15 MED ORDER — TICAGRELOR 90 MG PO TABS
90.0000 mg | ORAL_TABLET | Freq: Two times a day (BID) | ORAL | Status: DC
  Administered 2020-05-15 – 2020-05-19 (×8): 90 mg via ORAL
  Filled 2020-05-15 (×8): qty 1

## 2020-05-15 MED ORDER — LIDOCAINE HCL (PF) 1 % IJ SOLN
INTRAMUSCULAR | Status: DC | PRN
  Administered 2020-05-15: 2 mL

## 2020-05-15 MED ORDER — HEPARIN SODIUM (PORCINE) 1000 UNIT/ML IJ SOLN
INTRAMUSCULAR | Status: AC
  Filled 2020-05-15: qty 1

## 2020-05-15 MED ORDER — ONDANSETRON HCL 4 MG/2ML IJ SOLN: 4.0000 mg | Freq: Four times a day (QID) | INTRAMUSCULAR | Status: DC | PRN

## 2020-05-15 MED ORDER — TIROFIBAN HCL IN NACL 5-0.9 MG/100ML-% IV SOLN
0.1500 ug/kg/min | INTRAVENOUS | Status: DC
  Administered 2020-05-15 – 2020-05-16 (×2): 0.15 ug/kg/min via INTRAVENOUS
  Filled 2020-05-15 (×5): qty 100

## 2020-05-15 MED ORDER — HEPARIN SODIUM (PORCINE) 5000 UNIT/ML IJ SOLN
5000.0000 [IU] | Freq: Three times a day (TID) | INTRAMUSCULAR | Status: DC
  Administered 2020-05-15 – 2020-05-17 (×5): 5000 [IU] via SUBCUTANEOUS
  Filled 2020-05-15 (×5): qty 1

## 2020-05-15 MED ORDER — SODIUM CHLORIDE 0.9 % IV SOLN: INTRAVENOUS | Status: AC

## 2020-05-15 MED ORDER — FENTANYL CITRATE (PF) 100 MCG/2ML IJ SOLN
INTRAMUSCULAR | Status: DC | PRN
  Administered 2020-05-15: 25 ug via INTRAVENOUS

## 2020-05-15 MED ORDER — VERAPAMIL HCL 2.5 MG/ML IV SOLN: INTRAVENOUS | Status: DC | PRN

## 2020-05-15 MED ORDER — TIROFIBAN (AGGRASTAT) BOLUS VIA INFUSION
INTRAVENOUS | Status: DC | PRN
  Administered 2020-05-15: 1927.5 ug via INTRAVENOUS

## 2020-05-15 MED ORDER — HEPARIN SODIUM (PORCINE) 1000 UNIT/ML IJ SOLN
INTRAMUSCULAR | Status: DC | PRN
  Administered 2020-05-15: 6000 [IU] via INTRAVENOUS
  Administered 2020-05-15: 2500 [IU] via INTRAVENOUS

## 2020-05-15 MED ORDER — VERAPAMIL HCL 2.5 MG/ML IV SOLN
INTRAVENOUS | Status: AC
  Filled 2020-05-15: qty 2

## 2020-05-15 MED ORDER — PANTOPRAZOLE SODIUM 40 MG PO TBEC
40.0000 mg | DELAYED_RELEASE_TABLET | Freq: Two times a day (BID) | ORAL | Status: DC
  Administered 2020-05-15 – 2020-05-19 (×8): 40 mg via ORAL
  Filled 2020-05-15 (×8): qty 1

## 2020-05-15 MED ORDER — SODIUM CHLORIDE 0.9 % IV SOLN: INTRAVENOUS | Status: DC

## 2020-05-15 MED ORDER — FENTANYL CITRATE (PF) 100 MCG/2ML IJ SOLN
INTRAMUSCULAR | Status: AC
  Filled 2020-05-15: qty 2

## 2020-05-15 MED ORDER — OXYCODONE HCL 5 MG PO TABS: 5.0000 mg | ORAL_TABLET | ORAL | Status: DC | PRN

## 2020-05-15 MED ORDER — NITROGLYCERIN 1 MG/10 ML FOR IR/CATH LAB
INTRA_ARTERIAL | Status: DC | PRN
  Administered 2020-05-15 (×2): 100 ug

## 2020-05-15 MED ORDER — TICAGRELOR 90 MG PO TABS
ORAL_TABLET | ORAL | Status: DC | PRN
  Administered 2020-05-15: 180 mg via ORAL

## 2020-05-15 MED ORDER — LABETALOL HCL 5 MG/ML IV SOLN: 10.0000 mg | INTRAVENOUS | Status: AC | PRN

## 2020-05-15 MED ORDER — ASPIRIN EC 81 MG PO TBEC
81.0000 mg | DELAYED_RELEASE_TABLET | Freq: Every day | ORAL | Status: DC
  Administered 2020-05-16 – 2020-05-19 (×4): 81 mg via ORAL
  Filled 2020-05-15 (×4): qty 1

## 2020-05-15 MED ORDER — NITROGLYCERIN 1 MG/10 ML FOR IR/CATH LAB
INTRA_ARTERIAL | Status: AC
  Filled 2020-05-15: qty 10

## 2020-05-15 MED ORDER — LOSARTAN POTASSIUM 25 MG PO TABS: 25.0000 mg | ORAL_TABLET | Freq: Every day | ORAL | Status: DC

## 2020-05-15 MED ORDER — ATORVASTATIN CALCIUM 80 MG PO TABS
80.0000 mg | ORAL_TABLET | Freq: Every day | ORAL | Status: DC
  Administered 2020-05-15 – 2020-05-17 (×3): 80 mg via ORAL
  Filled 2020-05-15 (×3): qty 1

## 2020-05-15 MED ORDER — ACETAMINOPHEN 325 MG PO TABS
650.0000 mg | ORAL_TABLET | ORAL | Status: DC | PRN
  Administered 2020-05-17: 650 mg via ORAL
  Filled 2020-05-15: qty 2

## 2020-05-15 MED ORDER — MIDAZOLAM HCL 2 MG/2ML IJ SOLN
INTRAMUSCULAR | Status: AC
  Filled 2020-05-15: qty 2

## 2020-05-15 MED ORDER — HEPARIN (PORCINE) IN NACL 1000-0.9 UT/500ML-% IV SOLN
INTRAVENOUS | Status: AC
  Filled 2020-05-15: qty 1000

## 2020-05-15 MED ORDER — ASPIRIN 81 MG PO CHEW
324.0000 mg | CHEWABLE_TABLET | Freq: Once | ORAL | Status: AC
  Administered 2020-05-15: 324 mg via ORAL
  Filled 2020-05-15: qty 4

## 2020-05-15 MED ORDER — NITROGLYCERIN 0.4 MG SL SUBL: 0.4000 mg | SUBLINGUAL_TABLET | SUBLINGUAL | Status: DC | PRN

## 2020-05-15 MED ORDER — HEPARIN (PORCINE) 25000 UT/250ML-% IV SOLN
950.0000 [IU]/h | INTRAVENOUS | Status: DC
  Administered 2020-05-15: 950 [IU]/h via INTRAVENOUS
  Filled 2020-05-15: qty 250

## 2020-05-15 MED ORDER — HEPARIN BOLUS VIA INFUSION
4000.0000 [IU] | Freq: Once | INTRAVENOUS | Status: AC
  Administered 2020-05-15: 4000 [IU] via INTRAVENOUS
  Filled 2020-05-15: qty 4000

## 2020-05-15 MED ORDER — SODIUM CHLORIDE 0.9% FLUSH: 3.0000 mL | INTRAVENOUS | Status: DC | PRN

## 2020-05-15 MED ORDER — TIROFIBAN HCL IN NACL 5-0.9 MG/100ML-% IV SOLN
INTRAVENOUS | Status: AC
  Filled 2020-05-15: qty 100

## 2020-05-15 MED ORDER — SODIUM CHLORIDE 0.9% FLUSH
3.0000 mL | Freq: Two times a day (BID) | INTRAVENOUS | Status: DC
  Administered 2020-05-15 – 2020-05-17 (×4): 3 mL via INTRAVENOUS

## 2020-05-15 MED ORDER — MIDAZOLAM HCL 2 MG/2ML IJ SOLN
INTRAMUSCULAR | Status: DC | PRN
  Administered 2020-05-15: 1 mg via INTRAVENOUS

## 2020-05-15 MED ORDER — TICAGRELOR 90 MG PO TABS
ORAL_TABLET | ORAL | Status: AC
  Filled 2020-05-15: qty 1

## 2020-05-15 MED ORDER — HEPARIN (PORCINE) IN NACL 1000-0.9 UT/500ML-% IV SOLN
INTRAVENOUS | Status: DC | PRN
  Administered 2020-05-15 (×2): 500 mL

## 2020-05-15 MED ORDER — HYDRALAZINE HCL 20 MG/ML IJ SOLN: 10.0000 mg | INTRAMUSCULAR | Status: AC | PRN

## 2020-05-15 MED ORDER — LIDOCAINE HCL (PF) 1 % IJ SOLN
INTRAMUSCULAR | Status: AC
  Filled 2020-05-15: qty 30

## 2020-05-15 MED ORDER — ASPIRIN 300 MG RE SUPP
300.0000 mg | RECTAL | Status: DC
Start: 2020-05-15 — End: 2020-05-15

## 2020-05-15 MED ORDER — IOHEXOL 350 MG/ML SOLN
INTRAVENOUS | Status: AC
  Filled 2020-05-15: qty 1

## 2020-05-15 MED ORDER — ASPIRIN 81 MG PO CHEW: 81.0000 mg | CHEWABLE_TABLET | Freq: Every day | ORAL | Status: DC

## 2020-05-15 MED ORDER — LOSARTAN POTASSIUM 50 MG PO TABS
25.0000 mg | ORAL_TABLET | Freq: Every day | ORAL | Status: DC
  Administered 2020-05-16: 25 mg via ORAL
  Filled 2020-05-15 (×2): qty 1

## 2020-05-15 MED ORDER — ASPIRIN 81 MG PO CHEW: 324.0000 mg | CHEWABLE_TABLET | ORAL | Status: DC

## 2020-05-15 SURGICAL SUPPLY — 18 items
BALLN SAPPHIRE 2.5X12 (BALLOONS) ×2
BALLN SAPPHIRE ~~LOC~~ 3.25X15 (BALLOONS) ×2 IMPLANT
BALLOON SAPPHIRE 2.5X12 (BALLOONS) ×1 IMPLANT
CATH INFINITI JR4 5F (CATHETERS) ×2 IMPLANT
CATH VISTA GUIDE 6FR XB3.5 (CATHETERS) ×2 IMPLANT
DEVICE RAD COMP TR BAND LRG (VASCULAR PRODUCTS) ×2 IMPLANT
ELECT DEFIB PAD ADLT CADENCE (PAD) ×2 IMPLANT
GLIDESHEATH SLEND A-KIT 6F 22G (SHEATH) ×2 IMPLANT
GUIDEWIRE INQWIRE 1.5J.035X260 (WIRE) ×1 IMPLANT
INQWIRE 1.5J .035X260CM (WIRE) ×2
KIT ENCORE 26 ADVANTAGE (KITS) ×2 IMPLANT
KIT HEART LEFT (KITS) ×2 IMPLANT
PACK CARDIAC CATHETERIZATION (CUSTOM PROCEDURE TRAY) ×2 IMPLANT
SHEATH PROBE COVER 6X72 (BAG) ×2 IMPLANT
STENT RESOLUTE ONYX 3.0X22 (Permanent Stent) ×2 IMPLANT
TRANSDUCER W/STOPCOCK (MISCELLANEOUS) ×2 IMPLANT
TUBING CIL FLEX 10 FLL-RA (TUBING) ×2 IMPLANT
WIRE ASAHI PROWATER 180CM (WIRE) ×2 IMPLANT

## 2020-05-15 NOTE — ED Notes (Addendum)
Called pharmacy for heparin 

## 2020-05-15 NOTE — Progress Notes (Signed)
ANTICOAGULATION CONSULT NOTE - Initial Consult  Pharmacy Consult for heparin Indication: chest pain/ACS  Allergies  Allergen Reactions  . Sulfamethoxazole-Trimethoprim Anaphylaxis and Rash    DRESS syndrome requiring hospitalization; 12/11/2018    Patient Measurements: Height: 5\' 7"  (170.2 cm) Weight: 77.1 kg (170 lb) IBW/kg (Calculated) : 66.1 Heparin Dosing Weight: 77.1 kg  Vital Signs: Temp: 98.1 F (36.7 C) (07/23 1059) Temp Source: Oral (07/23 1059) BP: 120/93 (07/23 1231) Pulse Rate: 56 (07/23 1231)  Labs: Recent Labs    05/13/20 2119 05/15/20 1109  HGB 12.2* 13.1  HCT 40.5 43.5  PLT 299 331  APTT  --  27  LABPROT  --  12.2  INR  --  0.9  CREATININE 1.39* 1.02  TROPONINIHS 6 29*    Estimated Creatinine Clearance: 65.7 mL/min (by C-G formula based on SCr of 1.02 mg/dL).   Medical History: Past Medical History:  Diagnosis Date  . Anemia   . DRESS syndrome   . GERD (gastroesophageal reflux disease)   . Hiatal hernia   . Hypertension   . 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 Toxoplasmosis     Assessment: 69 yo M with chest pain.  Pharmacy consulted to dose heparin.  No anticoagulants PTA.  Baseline coags WNL. CBC WNL. SCr WNL   Goal of Therapy:  Heparin level 0.3-0.7 units/ml Monitor platelets by anticoagulation protocol: Yes   Plan:  Give 4000 units bolus x 1 Start heparin infusion at 950 units/hr Check anti-Xa level in 6 hours and daily while on heparin Continue to monitor H&H and platelets  80, Pharm.D [phone] 05/15/2020 12:43 PM

## 2020-05-15 NOTE — H&P (Addendum)
The patient has been seen in conjunction with Kathyrn Drown, NP. All aspects of care have been considered and discussed. The patient has been personally interviewed, examined, and all clinical data has been reviewed.  69 year old gentleman who has had waxing and waning chest discomfort 05/13/2020.  Was seen in the emergency room on July 21 but signed out AMA after 3 hours when he began feeling better and he had not received any advanced care or update on his condition.  Over the next 36 to 48 hours he had waxing and waning discomfort until around 11 AM when he developed severe sustained chest pressure [facility] the emergency room where ST elevation myocardial infarction was activated at 12:30 PM. The patient was met in the ambulance bay.  His skin was warm and dry.  He was not short of breath unable to lie flat.  He complained of 5 out of 10 chest discomfort.  EKGs from [facility] and by EMS were reviewed.  Blood pressure was in the 120/80 mmHg range but had varied between 90 systolic and 440 mm systolic with heart rates in the 50s. Heart sounds are somewhat distant.  No obvious murmur was heard.  Lungs were clear.  Abdomen is soft.  Radial pulses are 2+ and symmetric as are the carotids and the posterior tibials bilaterally.  He was neurologically intact. EKG demonstrated ST elevation in 1, aVL, V2 through V5 all consistent with a massive anteroseptal and lateral ST elevation myocardial infarction. IMPRESSION: Evolving acute anteroseptal/anterolateral anterior myocardial infarction commencing around 11 PM but having a stuttering pattern for the previous 48 hours. RECOMMENDATIONS: The patient will be taken straight to the catheterization laboratory.  Emergency percutaneous intervention will be performed if possible.  The nature and risk of the procedure including stroke, death, emergency surgery, bleeding, vessel injury, among other complications were discussed with the patient and accepted.   Critical  care time 35 minutes.  Cardiology Admission History and Physical:   Patient ID: Martin Mcdowell MRN: [id number]; DOB: [birth year]   Admission date: 05/15/2020  Primary Care Provider: System, Pcp Not In [place] Cardiologist: No primary care provider on file. [facility] to [facility]  Chief Complaint:  STEMI  Patient Profile:   Martin Mcdowell is a 69 y.o. male with history of hypertension, GERD, hiatal hernia and DRESS syndrome who presented to [facility] by EMS for STEMI.  History of Present Illness:   Martin Mcdowell is a 69 year old male with a history stated above who initially presented to [facility] emergency department 05/13/2020 with substernal chest pain with radiation to his left arm with associated diaphoresis and dizziness. Unfortunately due to ED wait times, patient left AMA. Brief cardiac work-up at that time showed a stable EKG with a negative troponin at 6.  He then reports that his symptoms persisted, waxing and waning over the last several days. He states this morning, symptoms worsened acutely with anterior chest pain with left arm radiation. On EMS arrival, repeat EKG showed ST elevation in leads V2-V4 therefore STEMI was paged and patient was transferred emergently to the cardiac Cath Lab for further ischemic evaluation.  Lab work thus far shows an hsT at 21. Creatinine stable at 1.02.  Lipid panel performed which shows an LDL elevated at 168. CBC stable. D-dimer normal at 0.40.  All medications appear to be losartan 25 mg p.o. daily, Protonix 40 mg p.o. twice daily, prednisone 40 mg p.o. twice daily  Past Medical History:  Diagnosis Date   Anemia    DRESS syndrome  GERD (gastroesophageal reflux disease)    Hiatal hernia    Hypertension    Lesion of right lung    CT- multicystic right lower lobe lesion    Toxoplasmosis     Past Surgical History:  Procedure Laterality Date   NO PAST SURGERIES      Medications Prior to Admission: Prior to Admission medications   Medication Sig Start Date  End Date Taking? Authorizing Provider  azithromycin (ZITHROMAX) 250 MG tablet Take 250 mg by mouth daily. 04/28/20   [provider]  losartan (COZAAR) 25 MG tablet Take 25 mg by mouth daily. 05/09/20   [provider]  pantoprazole (PROTONIX) 40 MG tablet Take 1 tablet (40 mg total) by mouth 2 (two) times daily. 01/22/19   Irene Shipper, MD  predniSONE (DELTASONE) 20 MG tablet Take 40 mg by mouth 2 (two) times daily. 04/17/20   [provider]     Allergies:    Allergies  Allergen Reactions   Sulfamethoxazole-Trimethoprim Anaphylaxis and Rash    DRESS syndrome requiring hospitalization; 12/11/2018    Social History:   Social History   Socioeconomic History   Marital status: Married    Spouse name: Not on file   Number of children: Not on file   Years of education: Not on file   Highest education level: Not on file  Occupational History   Not on file  Tobacco Use   Smoking status: Former Smoker    Types: Cigarettes   Smokeless tobacco: Never Used  Vaping Use   Vaping Use: Never used  Substance and Sexual Activity   Alcohol use: Yes    Alcohol/week: 2.0 - 3.0 standard drinks    Types: 2 - 3 Standard drinks or equivalent per week    Comment: Occ    Drug use: No   Sexual activity: Not on file  Other Topics Concern   Not on file  Social History Narrative   Not on file   Social Determinants of Health   Financial Resource Strain:    Difficulty of Paying Living Expenses:   Food Insecurity:    Worried About [place] in the Last Year:    Arboriculturist in the Last Year:   Transportation Needs:    Film/video editor (Medical):    Lack of Transportation (Non-Medical):   Physical Activity:    Days of Exercise per Week:    Minutes of Exercise per Session:   Stress:    Feeling of Stress :   Social Connections:    Frequency of Communication with Friends and Family:    Frequency of Social Gatherings with Friends and Family:    Attends  Religious Services:    Active Member of Clubs or Organizations:    Attends Music therapist:    Marital Status:   Intimate Partner Violence:    Fear of Current or Ex-Partner:    Emotionally Abused:    Physically Abused:    Sexually Abused:     Family History:   The patient's family history is negative for Colon cancer, Stomach cancer, Pancreatic cancer, Esophageal cancer, and Rectal cancer.    ROS:  Please see the history of present illness.  All other ROS reviewed and negative.     Physical Exam/Data:   Vitals:   05/15/20 1145 05/15/20 1215 05/15/20 1231 05/15/20 1308  BP: 124/82 112/84 (!) 120/93   Pulse: 49 54 56   Resp: 18 (!) 11 23   Temp:  TempSrc:      SpO2: 100% 99% 100% 99%  Weight:      Height:       No intake or output data in the 24 hours ending 05/15/20 1331 Last 3 Weights 05/15/2020 05/13/2020 03/13/2019  Weight (lbs) 170 lb 170 lb 163 lb  Weight (kg) 77.111 kg 77.111 kg 73.936 kg     Body mass index is 26.63 kg/m.   General: Well developed, well nourished, NAD Neck: Negative for carotid bruits. No JVD Lungs:Clear to ausculation bilaterally. No wheezes, rales, or rhonchi. Breathing is unlabored. Cardiovascular: RRR with S1 S2. No murmur Extremities: No edema. Radial pulses 2+ bilaterally Neuro: Alert and oriented. No focal deficits. No facial asymmetry. MAE spontaneously. Psych: Responds to questions appropriately with normal affect.    EKG:  The ECG that was done 05/15/2020 was personally reviewed and demonstrates sinus bradycardia with significant ST elevation in leads V2, V3, and V4.  Reciprocal changes in lead III.   Relevant CV Studies:  No prior CV studies to review  Laboratory Data:  High Sensitivity Troponin:   Recent Labs  Lab 05/13/20 2119 05/15/20 1109 05/15/20 1235  TROPONINIHS 6 29* 26*      Chemistry Recent Labs  Lab 05/13/20 2119 05/15/20 1109  NA 145 144  K 3.9 3.7  CL 107 108  CO2 28 25  GLUCOSE 93 155*   BUN 25* 21  CREATININE 1.39* 1.02  CALCIUM 9.2 9.5  GFRNONAA 52* >60  GFRAA >60 >60  ANIONGAP 10 11    Recent Labs  Lab 05/15/20 1109  PROT 7.0  ALBUMIN 3.9  AST 17  ALT 50*  ALKPHOS 84  BILITOT 1.3*   Hematology Recent Labs  Lab 05/13/20 2119 05/15/20 1109  WBC 10.8* 12.4*  RBC 6.03* 6.44*  HGB 12.2* 13.1  HCT 40.5 43.5  MCV 67.2* 67.5*  MCH 20.2* 20.3*  MCHC 30.1 30.1  RDW 17.5* 18.4*  PLT 299 331   BNPNo results for input(s): BNP, PROBNP in the last 168 hours.  DDimer  Recent Labs  Lab 05/15/20 1109  DDIMER 0.40    Radiology/Studies:  No results found.     TIMI Risk Score for ST  Elevation MI:   The patient's TIMI risk score is  , which indicates a  % risk of all cause mortality at 30 days.     Assessment and Plan:   1. STEMI: -Pt initially presented to [facility] emergency department 05/13/2020 with substernal chest pain with radiation to his left arm with associated diaphoresis and dizziness.  Unfortunately due to ED wait times, patient left AMA. Brief cardiac work-up at that time showed a stable EKG with a negative troponin at 6. He then reports that his symptoms persisted over the next two days with acute worsening this AM with anterior chest pain with left arm radiation.  -Repeat EKG per EMS showed ST elevation in leads V2-V4 therefore STEMI was paged out and patient was transferred emergently to the cardiac Cath Lab for further ischemic evaluation. -See cath report   2. HTN: -On PTA losartan 30m QD -WIill adjust medications as tolerated   3. HLD: -LDL on ED presentation elevated at 168 -Will start high intensity statin 825mQD  4. GERD: -On PTA Protonix   5. 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 Started on Prednisone with improvement in symptoms  Since discharge from the hospital, he was tapered  off prednisone but has flared X3. He also saw neurology  for elevated CK thought to be 2/2 to DRESS.  Severity of Illness: The appropriate patient status for this patient is INPATIENT. Inpatient status is judged to be reasonable and necessary in order to provide the required intensity of service to ensure the patient's safety. The patient's presenting symptoms, physical exam findings, and initial radiographic and laboratory data in the context of their chronic comorbidities is felt to place them at high risk for further clinical deterioration. Furthermore, it is not anticipated that the patient will be medically stable for discharge from the hospital within 2 midnights of admission. The following factors support the patient status of inpatient.   " The patient's presenting symptoms include chest pain. " The worrisome physical exam findings include ST EMI on EKG . " The initial radiographic and laboratory data are worrisome because of STEMI on EKG . " The chronic co-morbidities include HLD.   * I certify that at the point of admission it is my clinical judgment that the patient will require inpatient hospital care spanning beyond 2 midnights from the point of admission due to high intensity of service, high risk for further deterioration and high frequency of surveillance required.*    For questions or updates, please contact [place] Please consult [email or web address] for contact info under     Signed, Kathyrn Drown, NP  05/15/2020 1:31 PM

## 2020-05-15 NOTE — CV Procedure (Addendum)
   Acute anterior ST elevation MI  Right radial approach using real-time vascular ultrasound  LAD mid total occlusion proximal to a large diagonal and a large wraparound LAD.  Successful PTCA with stent implantation postdilated to 3.25 mm in diameter.  Circumflex with a dominant obtuse marginal containing 85% mid vessel stenosis and 50% ostial narrowing.  Continuation of the circumflex beyond the obtuse marginal origin contains 95% stenosis.  RCA contains proximal to mid eccentric 80% narrowing  Left main is widely patent  Anteroapical severe hypokinesis with EF 35% LVEDP 28 mmHg  He should have staged RCA and circumflex PCI, likely prior to discharge or within the next 30 days.  Will make final decision based upon the presence or absence of heart failure symptoms and clinical course over the next 24 to 48 hours.

## 2020-05-15 NOTE — ED Triage Notes (Signed)
Patient presents to the ER for chest pain that radiates to the left arm x1 hour. Patient reports a similar episode x2 days ago at [facility]. Patient reports he left due to long wait times. Patient reports he felt better and left the other night

## 2020-05-15 NOTE — ED Provider Notes (Addendum)
[facility]-EMERGENCY DEPT Provider Note   CSN: [id number] Arrival date & time: 05/15/20  1050     History Chief Complaint  Patient presents with  . Chest Pain    Martin Mcdowell is a 69 y.o. male with history of HTN, GERD, DRESS syndrome, toxoplasmosis of the eye who presents with chest pain. He states that he first started having chest pain 2 days ago. He wasn't feeling well and then Wednesday night he developed chest pain. He went to [facility] and waited for 3 hours but LWBS due to long wait times and his chest pain spontaneously went away. This morning the chest pain returned. It is over the left side of the chest and feels like a constant pressure/ache. It sometimes radiates to the left arm. He feels like his body isn't getting enough circulation and states he will get numbness/tingling in the extremities. He denies fever but has had cold sweats. He also reports SOB. He denies fever, syncope, back pain, abdominal pain, N/V. He has chronic swelling in the left leg since 2011. He has never seen a cardiologist.   HPI     Past Medical History:  Diagnosis Date  . Anemia   . DRESS syndrome   . GERD (gastroesophageal reflux disease)   . Hiatal hernia   . Hypertension   . 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 Toxoplasmosis     Patient Active Problem List   Diagnosis Date Noted  . Acute esophagitis 03/06/2019  . DRESS syndrome 12/23/2018  . Anemia 12/23/2018  . GERD (gastroesophageal reflux disease) 12/23/2018  . Toxoplasmosis 12/23/2018  . Epigastric pain 09/18/2018  . Decreased libido 09/18/2018  . Fatigue 08/07/2018  . Cough 08/07/2018  . Screening for colon cancer 08/07/2018    Past Surgical History:  Procedure Laterality Date  . NO PAST SURGERIES         Family History  Problem Relation Age of Onset  . Colon cancer Neg Hx   . Stomach cancer Neg Hx   . Pancreatic cancer Neg Hx   . Esophageal cancer Neg Hx   . Rectal  cancer Neg Hx     Social History   Tobacco Use  . Smoking status: Former Smoker    Types: Cigarettes  . Smokeless tobacco: Never Used  Vaping Use  . Vaping Use: Never used  Substance Use Topics  . Alcohol use: Yes    Alcohol/week: 2.0 - 3.0 standard drinks    Types: 2 - 3 Standard drinks or equivalent per week    Comment: Occ   . Drug use: No    Home Medications Prior to Admission medications   Medication Sig Start Date End Date Taking? Authorizing Provider  pantoprazole (PROTONIX) 40 MG tablet Take 1 tablet (40 mg total) by mouth 2 (two) times daily. 01/22/19   Hilarie Fredrickson, MD    Allergies    Sulfamethoxazole-trimethoprim  Review of Systems   Review of Systems  Constitutional: Positive for chills and diaphoresis. Negative for fever.  Respiratory: Positive for shortness of breath. Negative for cough and wheezing.   Cardiovascular: Positive for chest pain and leg swelling (chronic left leg). Negative for palpitations.  Gastrointestinal: Negative for abdominal pain, nausea and vomiting.  Allergic/Immunologic: Positive for immunocompromised state (on prednisone chronically).  Neurological: Positive for dizziness. Negative for syncope.  All other systems reviewed and are negative.   Physical Exam Updated Vital Signs BP (!) 128/89 (BP Location: Left  Arm)   Pulse 59   Temp 98.1 F (36.7 C) (Oral)   Resp 16   Ht 5\' 7"  (1.702 m)   Wt 77.1 kg   SpO2 100%   BMI 26.63 kg/m   Physical Exam Vitals and nursing note reviewed.  Constitutional:      General: He is not in acute distress.    Appearance: He is well-developed. He is not ill-appearing.     Comments: Mild distress. Cooperative. Skin is cool and clammy  HENT:     Head: Normocephalic and atraumatic.  Eyes:     General: No scleral icterus.       Right eye: No discharge.        Left eye: No discharge.     Conjunctiva/sclera: Conjunctivae normal.     Pupils: Pupils are equal, round, and reactive to light.    Cardiovascular:     Rate and Rhythm: Normal rate and regular rhythm.  Pulmonary:     Effort: Pulmonary effort is normal. No respiratory distress.     Breath sounds: Normal breath sounds.  Abdominal:     General: There is no distension.     Palpations: Abdomen is soft.     Tenderness: There is no abdominal tenderness.  Musculoskeletal:     Cervical back: Normal range of motion.     Left lower leg: Edema present.  Skin:    General: Skin is warm.  Neurological:     Mental Status: He is alert and oriented to person, place, and time.  Psychiatric:        Behavior: Behavior normal.     ED Results / Procedures / Treatments   Labs (all labs ordered are listed, but only abnormal results are displayed) Labs Reviewed  CBC WITH DIFFERENTIAL/PLATELET - Abnormal; Notable for the following components:      Result Value   WBC 12.4 (*)    RBC 6.44 (*)    MCV 67.5 (*)    MCH 20.3 (*)    RDW 18.4 (*)    Neutro Abs 8.0 (*)    Abs Immature Granulocytes 0.11 (*)    All other components within normal limits  COMPREHENSIVE METABOLIC PANEL - Abnormal; Notable for the following components:   Glucose, Bld 155 (*)    ALT 50 (*)    Total Bilirubin 1.3 (*)    All other components within normal limits  LIPID PANEL - Abnormal; Notable for the following components:   Cholesterol 242 (*)    LDL Cholesterol 168 (*)    All other components within normal limits  TROPONIN I (HIGH SENSITIVITY) - Abnormal; Notable for the following components:   Troponin I (High Sensitivity) 29 (*)    All other components within normal limits  SARS CORONAVIRUS 2 BY RT PCR (HOSPITAL ORDER, PERFORMED IN [facility] [facility])  PROTIME-INR  APTT  D-DIMER, QUANTITATIVE (NOT AT [facility])  HEMOGLOBIN A1C  TROPONIN I (HIGH SENSITIVITY)    EKG EKG Interpretation  Date/Time:  Friday May 15 2020 10:58:09 EDT Ventricular Rate:  50 PR Interval:    QRS Duration: 88 QT Interval:  386 QTC Calculation: 352 R  Axis:   28 Text Interpretation: Sinus rhythm No significant change since 05/13/2020 Confirmed by 05/15/2020 (Geoffery Lyons) on 05/15/2020 12:24:03 PM   Radiology DG Chest 2 View  Result Date: 05/13/2020 CLINICAL DATA:  Chest pain EXAM: CHEST - 2 VIEW COMPARISON:  12/10/2018 FINDINGS: Lungs are well expanded and are symmetric. Nodular density at the lung bases bilaterally  likely represent nipple shadows. The lungs are otherwise clear. No pneumothorax or pleural effusion. Cardiac size within normal limits. Pulmonary vascularity normal. No acute bone abnormality. IMPRESSION: No active cardiopulmonary disease. Electronically Signed   By: Helyn Numbers MD   On: 05/13/2020 21:28    Procedures Procedures (including critical care time)  CRITICAL CARE Performed by: Bethel Born   Total critical care time: 35 minutes  Critical care time was exclusive of separately billable procedures and treating other patients.  Critical care was necessary to treat or prevent imminent or life-threatening deterioration.  Critical care was time spent personally by me on the following activities: development of treatment plan with patient and/or surrogate as well as nursing, discussions with consultants, evaluation of patient's response to treatment, examination of patient, obtaining history from patient or surrogate, ordering and performing treatments and interventions, ordering and review of laboratory studies, ordering and review of radiographic studies, pulse oximetry and re-evaluation of patient's condition.   Medications Ordered in ED Medications  0.9 %  sodium chloride infusion (has no administration in time range)  heparin ADULT infusion 100 units/mL (25000 units/265mL sodium chloride 0.45%) (950 Units/hr Intravenous New Bag/Given 05/15/20 1245)  aspirin chewable tablet 324 mg (324 mg Oral Given 05/15/20 1117)  heparin bolus via infusion 4,000 Units (4,000 Units Intravenous Bolus from Bag 05/15/20 1245)     ED Course  I have reviewed the triage vital signs and the nursing notes.  Pertinent labs & imaging results that were available during my care of the patient were reviewed by me and considered in my medical decision making (see chart for details).  69 year old male presents with acute substernal chest pain that started this morning with chills, diaphoresis, dizziness. Pt is newly bradycardic in the 40s-50s. Otherwise vitals are normal. Pt is cool and clammy to touch and appears uncomfortable. His history is concerning for ACS. Initial EKG is sinus bradycardia. Labs reviewed from 2 nights ago show mild AKI and troponin was 6. CXR was negative. EKG at that time was NSR. Will obtain repeats labs. Shared visit with Dr. Judd Lien.  12:15PM CBC shows mild leukocytosis (12). Pt is on chronic steroids. CMP shows resolved AKI. Initial trop is 29. Pt was given full strength ASA.   12:31 PM Repeat EKG obtained - he has ST elevation in V2, V3, V4. Code stemi called. Heparin ordered. Dr. Judd Lien has discussed with Cardiology and [facility] contacted to transport pt to [facility].  MDM Rules/Calculators/A&P                           Final Clinical Impression(s) / ED Diagnoses Final diagnoses:  ST elevation myocardial infarction (STEMI), unspecified artery [facility])    Rx / DC Orders ED Discharge Orders    None       Bethel Born, PA-C 05/15/20 1248    Bethel Born, PA-C 05/15/20 1323    Geoffery Lyons, MD 05/15/20 [phone]

## 2020-05-16 ENCOUNTER — Inpatient Hospital Stay (HOSPITAL_COMMUNITY): Payer: Medicare Other

## 2020-05-16 DIAGNOSIS — I5021 Acute systolic (congestive) heart failure: Secondary | ICD-10-CM

## 2020-05-16 LAB — CBC
HCT: 38.7 % — ABNORMAL LOW (ref 39.0–52.0)
Hemoglobin: 11.9 g/dL — ABNORMAL LOW (ref 13.0–17.0)
MCH: 19.9 pg — ABNORMAL LOW (ref 26.0–34.0)
MCHC: 30.7 g/dL (ref 30.0–36.0)
MCV: 64.8 fL — ABNORMAL LOW (ref 80.0–100.0)
Platelets: 259 10*3/uL (ref 150–400)
RBC: 5.97 MIL/uL — ABNORMAL HIGH (ref 4.22–5.81)
RDW: 16.6 % — ABNORMAL HIGH (ref 11.5–15.5)
WBC: 12.1 10*3/uL — ABNORMAL HIGH (ref 4.0–10.5)
nRBC: 0 % (ref 0.0–0.2)

## 2020-05-16 LAB — BASIC METABOLIC PANEL
Anion gap: 10 (ref 5–15)
BUN: 15 mg/dL (ref 8–23)
CO2: 22 mmol/L (ref 22–32)
Calcium: 8.8 mg/dL — ABNORMAL LOW (ref 8.9–10.3)
Chloride: 108 mmol/L (ref 98–111)
Creatinine, Ser: 0.89 mg/dL (ref 0.61–1.24)
GFR calc Af Amer: >60 mL/min (ref >60)
GFR calc non Af Amer: >60 mL/min (ref >60)
Glucose, Bld: 136 mg/dL — ABNORMAL HIGH (ref 70–99)
Potassium: 3.6 mmol/L (ref 3.5–5.1)
Sodium: 140 mmol/L (ref 135–145)

## 2020-05-16 LAB — ECHOCARDIOGRAM COMPLETE
AR max vel: 1.97 cm2
AV Area VTI: 2.1 cm2
AV Area mean vel: 1.83 cm2
AV Mean grad: 4 mmHg
AV Peak grad: 7 mmHg
Ao pk vel: 1.32 m/s
Area-P 1/2: 1.74 cm2
Height: 67 in
P 1/2 time: 1370 msec
S' Lateral: 2.7 cm
Weight: 2723.12 oz

## 2020-05-16 MED ORDER — PERFLUTREN LIPID MICROSPHERE
1.0000 mL | INTRAVENOUS | Status: AC | PRN
  Administered 2020-05-16: 4 mL via INTRAVENOUS
  Filled 2020-05-16: qty 10

## 2020-05-16 MED ORDER — SPIRONOLACTONE 12.5 MG HALF TABLET
12.5000 mg | ORAL_TABLET | Freq: Every day | ORAL | Status: DC
  Administered 2020-05-16 – 2020-05-17 (×2): 12.5 mg via ORAL
  Filled 2020-05-16 (×2): qty 1

## 2020-05-16 MED ORDER — CHLORHEXIDINE GLUCONATE CLOTH 2 % EX PADS
6.0000 | MEDICATED_PAD | Freq: Every day | CUTANEOUS | Status: DC
  Administered 2020-05-17: 6 via TOPICAL

## 2020-05-16 MED ORDER — FUROSEMIDE 10 MG/ML IJ SOLN
20.0000 mg | Freq: Once | INTRAMUSCULAR | Status: AC
  Administered 2020-05-16: 20 mg via INTRAVENOUS
  Filled 2020-05-16: qty 2

## 2020-05-16 NOTE — Progress Notes (Signed)
Pt is receiving an Echo at this time

## 2020-05-16 NOTE — Progress Notes (Addendum)
[id number] Reviewed MI booklet with the patient. Patient was given stent card. Discussed risk factors heart healthy diet. Given handout. Will follow up with the patient on Monday.  Mr Martin Mcdowell say he may be interested in phase 2 cardiac rehab upon discharge in [place]. Referral placed.Will follow up with the patient on Monday.Gladstone Lighter, RN,BSN 05/16/2020 2:54 PM

## 2020-05-16 NOTE — Progress Notes (Signed)
EKG CRITICAL VALUE     12 lead EKG performed.  Critical value noted.  Sheryn Bison, RN notified.   Alto Denver, CCT 05/16/2020 7:59 AM

## 2020-05-16 NOTE — Progress Notes (Signed)
Talked to Martin Mcdowell via text about the order for medication Brillinta on discharge

## 2020-05-16 NOTE — Progress Notes (Signed)
  Echocardiogram 2D Echocardiogram has been performed with Definity.  Gerda Diss 05/16/2020, 11:03 AM

## 2020-05-16 NOTE — Progress Notes (Signed)
Patient Name: Martin Mcdowell 69 year old man admitted 7/23 with chest pain and ST elevation consistent with anteroseptal/lateral MI; he had been in the ER 7/21 but because of prolonged wait times had left AMA. CATH>> LAD mid occlusion of a large wraparound LAD-PCI; residual 85% circumflex-mid (dominant), 80% proximal RCA with recommendation for staged PCI EF 35% LVEDP 28    SUBJECTIVE: Without chest pain but with some dyspnea.  Past Medical History:  Diagnosis Date  . Anemia   . DRESS syndrome   . GERD (gastroesophageal reflux disease)   . Hiatal hernia   . Hypertension   . 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 Toxoplasmosis     Scheduled Meds:  Scheduled Meds: . aspirin EC  81 mg Oral Daily  . atorvastatin  80 mg Oral q1800  . heparin  5,000 Units Subcutaneous Q8H  . losartan  25 mg Oral Daily  . pantoprazole  40 mg Oral BID  . sodium chloride flush  3 mL Intravenous Q12H  . ticagrelor  90 mg Oral BID   Continuous Infusions: . sodium chloride    . sodium chloride     sodium chloride, acetaminophen, nitroGLYCERIN, ondansetron (ZOFRAN) IV, oxyCODONE, sodium chloride flush    PHYSICAL EXAM Vitals:   05/16/20 0300 05/16/20 0400 05/16/20 0500 05/16/20 0813  BP:  (!) 114/88    Pulse: 67 70    Resp: 19 17    Temp: 98 F (36.7 C)   98.8 F (37.1 C)  TempSrc: Oral   Oral  SpO2: 100% 97%    Weight:   77.2 kg   Height:       Well developed and nourished in no acute distress HENT normal Neck supple with JVP-  flat 8 cm Clear Regular rate and rhythm, no murmurs or gallops Abd-soft with active BS No Clubbing cyanosis edema Skin-warm and dry A & Oriented  Grossly normal sensory and motor function     TELEMETRY: Reviewed personnally pt in sinus rhythm:     Intake/Output Summary (Last 24 hours) at 05/16/2020 1025 Last data filed at 05/16/2020 2836 Gross per 24 hour  Intake 212.26 ml  Output 1325 ml  Net -1112.74 ml     LABS: Basic Metabolic Panel: Recent Labs  Lab 05/13/20 2119 05/13/20 2119 05/15/20 1109 05/15/20 1602 05/16/20 0356  NA 145  --  144  --  140  K 3.9  --  3.7  --  3.6  CL 107  --  108  --  108  CO2 28  --  25  --  22  GLUCOSE 93  --  155*  --  136*  BUN 25*  --  21  --  15  CREATININE 1.39*  --  1.02 1.07 0.89  CALCIUM 9.2   < > 9.5  --  8.8*   < > = values in this interval not displayed.   Cardiac Enzymes: No results for input(s): CKTOTAL, CKMB, CKMBINDEX, TROPONINI in the last 72 hours. CBC: Recent Labs  Lab 05/13/20 2119 05/15/20 1109 05/15/20 1602 05/16/20 0356  WBC 10.8* 12.4* 11.1* 12.1*  NEUTROABS  --  8.0*  --   --   HGB 12.2* 13.1 12.0* 11.9*  HCT 40.5 43.5 40.1 38.7*  MCV 67.2* 67.5* 65.6* 64.8*  PLT 299 331 266 259   PROTIME: Recent Labs    05/15/20 1109  LABPROT 12.2  INR 0.9   Liver Function  Tests: Recent Labs    05/15/20 1109  AST 17  ALT 50*  ALKPHOS 84  BILITOT 1.3*  PROT 7.0  ALBUMIN 3.9   No results for input(s): LIPASE, AMYLASE in the last 72 hours. BNP: BNP (last 3 results) No results for input(s): BNP in the last 8760 hours.  ProBNP (last 3 results) No results for input(s): PROBNP in the last 8760 hours.  D-Dimer: Recent Labs    05/15/20 1109  DDIMER 0.40   Hemoglobin A1C: Recent Labs    05/15/20 1109  HGBA1C 5.8*   Fasting Lipid Panel: Recent Labs    05/15/20 1109  CHOL 242*  HDL 45  LDLCALC 168*  TRIG 146  CHOLHDL 5.4   Thyroid Function Tests: No results for input(s): TSH, T4TOTAL, T3FREE, THYROIDAB in the last 72 hours.  Invalid input(s): FREET3 Anemia Panel: No results for input(s): VITAMINB12, FOLATE, FERRITIN, TIBC, IRON, RETICCTPCT in the last 72 hours.   Device Interrogation:    ASSESSMENT AND PLAN:  Active Problems:   STEMI (ST elevation myocardial infarction) (HCC)   Acute ST elevation myocardial infarction (STEMI) involving left anterior descending (LAD) coronary artery (HCC) Left  ventricular dysfunction Congestive heart failure-acute  With ongoing dyspnea, will hold off on beta-blockers.  Continue losartan.  Will give 1 dose of IV Lasix.  Await review of echo as to the presence or absence of apical clot.>> would begin heparin.      Signed, Sherryl Manges MD  05/16/2020

## 2020-05-17 LAB — TROPONIN I (HIGH SENSITIVITY)
Troponin I (High Sensitivity): >27000 ng/L (ref <18)
Troponin I (High Sensitivity): >27000 ng/L (ref <18)

## 2020-05-17 LAB — BASIC METABOLIC PANEL
Anion gap: 10 (ref 5–15)
BUN: 16 mg/dL (ref 8–23)
CO2: 24 mmol/L (ref 22–32)
Calcium: 8.7 mg/dL — ABNORMAL LOW (ref 8.9–10.3)
Chloride: 105 mmol/L (ref 98–111)
Creatinine, Ser: 1.07 mg/dL (ref 0.61–1.24)
GFR calc Af Amer: >60 mL/min (ref >60)
GFR calc non Af Amer: >60 mL/min (ref >60)
Glucose, Bld: 102 mg/dL — ABNORMAL HIGH (ref 70–99)
Potassium: 3.6 mmol/L (ref 3.5–5.1)
Sodium: 139 mmol/L (ref 135–145)

## 2020-05-17 LAB — HEPARIN LEVEL (UNFRACTIONATED): Heparin Unfractionated: 0.63 IU/mL (ref 0.30–0.70)

## 2020-05-17 MED ORDER — SODIUM CHLORIDE 0.9 % IV SOLN: INTRAVENOUS | Status: DC

## 2020-05-17 MED ORDER — SODIUM CHLORIDE 0.9 % IV SOLN: Freq: Once | INTRAVENOUS | Status: DC

## 2020-05-17 MED ORDER — SODIUM CHLORIDE 0.9% FLUSH: 3.0000 mL | INTRAVENOUS | Status: DC | PRN

## 2020-05-17 MED ORDER — CALCIUM CARBONATE ANTACID 500 MG PO CHEW
1.0000 | CHEWABLE_TABLET | Freq: Three times a day (TID) | ORAL | Status: DC | PRN
  Administered 2020-05-17 – 2020-05-18 (×2): 200 mg via ORAL
  Filled 2020-05-17 (×2): qty 1

## 2020-05-17 MED ORDER — SODIUM CHLORIDE 0.9 % IV SOLN: INTRAVENOUS | Status: AC

## 2020-05-17 MED ORDER — ASPIRIN 81 MG PO CHEW
81.0000 mg | CHEWABLE_TABLET | ORAL | Status: AC
  Administered 2020-05-18: 81 mg via ORAL
  Filled 2020-05-17: qty 1

## 2020-05-17 MED ORDER — SODIUM CHLORIDE 0.9 % IV SOLN: 250.0000 mL | INTRAVENOUS | Status: DC | PRN

## 2020-05-17 MED ORDER — SODIUM CHLORIDE 0.9 % WEIGHT BASED INFUSION
3.0000 mL/kg/h | INTRAVENOUS | Status: DC
  Administered 2020-05-18: 3 mL/kg/h via INTRAVENOUS

## 2020-05-17 MED ORDER — SODIUM CHLORIDE 0.9 % WEIGHT BASED INFUSION
1.0000 mL/kg/h | INTRAVENOUS | Status: DC
  Administered 2020-05-18: 1 mL/kg/h via INTRAVENOUS

## 2020-05-17 MED ORDER — PREDNISONE 20 MG PO TABS
20.0000 mg | ORAL_TABLET | Freq: Every day | ORAL | Status: DC
  Administered 2020-05-17 – 2020-05-19 (×3): 20 mg via ORAL
  Filled 2020-05-17 (×3): qty 1

## 2020-05-17 MED ORDER — LOSARTAN POTASSIUM 25 MG PO TABS: 12.5000 mg | ORAL_TABLET | Freq: Every day | ORAL | Status: DC

## 2020-05-17 MED ORDER — HEPARIN (PORCINE) 25000 UT/250ML-% IV SOLN
750.0000 [IU]/h | INTRAVENOUS | Status: DC
  Administered 2020-05-17: 950 [IU]/h via INTRAVENOUS
  Filled 2020-05-17: qty 250

## 2020-05-17 MED ORDER — PREDNISONE 20 MG PO TABS: 20.0000 mg | ORAL_TABLET | Freq: Every day | ORAL | Status: DC

## 2020-05-17 MED ORDER — SODIUM CHLORIDE 0.9% FLUSH
3.0000 mL | Freq: Two times a day (BID) | INTRAVENOUS | Status: DC
  Administered 2020-05-17: 3 mL via INTRAVENOUS

## 2020-05-17 NOTE — Progress Notes (Signed)
ANTICOAGULATION CONSULT NOTE - Initial Consult  Pharmacy Consult for heparin Indication: chest pain/ACS  Allergies  Allergen Reactions  . Sulfamethoxazole-Trimethoprim Anaphylaxis and Rash    DRESS syndrome requiring hospitalization; 12/11/2018    Patient Measurements: Height: 5\' 7"  (170.2 cm) Weight: 77.4 kg (170 lb 10.2 oz) IBW/kg (Calculated) : 66.1 Heparin Dosing Weight: 77.1 kg  Vital Signs: Temp: 98.1 F (36.7 C) (07/25 0742) Temp Source: Oral (07/25 0742) BP: 92/66 (07/25 0900) Pulse Rate: 85 (07/25 0900)  Labs: Recent Labs    05/15/20 1109 05/15/20 1109 05/15/20 1235 05/15/20 1602 05/16/20 0356 05/17/20 0222  HGB 13.1   < >  --  12.0* 11.9*  --   HCT 43.5  --   --  40.1 38.7*  --   PLT 331  --   --  266 259  --   APTT 27  --   --   --   --   --   LABPROT 12.2  --   --   --   --   --   INR 0.9  --   --   --   --   --   CREATININE 1.02   < >  --  1.07 0.89 1.07  TROPONINIHS 29*  --  26*  --   --   --    < > = values in this interval not displayed.    Estimated Creatinine Clearance: 62.6 mL/min (by C-G formula based on SCr of 1.07 mg/dL).   Medical History: Past Medical History:  Diagnosis Date  . Anemia   . DRESS syndrome   . GERD (gastroesophageal reflux disease)   . Hiatal hernia   . Hypertension   . 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 Toxoplasmosis     Medications:  Scheduled:  . aspirin EC  81 mg Oral Daily  . atorvastatin  80 mg Oral q1800  . Chlorhexidine Gluconate Cloth  6 each Topical Daily  . heparin  5,000 Units Subcutaneous Q8H  . pantoprazole  40 mg Oral BID  . spironolactone  12.5 mg Oral Daily  . ticagrelor  90 mg Oral BID   Infusions:  . sodium chloride    . sodium chloride     Followed by  . sodium chloride      Assessment: 68 yom who underwent cath on 7/23 finding 95% stenosis in Cx and 90% in RCA - plan for staged intervention. No AC PTA.  Having chest pain today - concerning EKG  changes. Plan for IV heparin restart while awaiting intervention. ECHO completed and no LV thrombus noted. Hgb 11.8, plt 259. No s/sx of bleeding.  Goal of Therapy:  Heparin level 0.3-0.7 units/ml Monitor platelets by anticoagulation protocol: Yes   Plan:  Start heparin infusion at 950 units/hr Check anti-Xa level in 6 hours and daily while on heparin Continue to monitor H&H and platelets  8/23, PharmD, BCCCP Clinical Pharmacist  Phone: [phone] 05/17/2020 11:17 AM  Please check AMION for all [facility] phone numbers After 10:00 PM, call Main Pharmacy [phone]

## 2020-05-17 NOTE — Progress Notes (Signed)
EKG CRITICAL VALUE     12 lead EKG performed.  Critical value noted.Yves Dill, RN notified.   Alto Denver, CCT 05/17/2020 7:28 AM

## 2020-05-17 NOTE — Progress Notes (Signed)
ANTICOAGULATION CONSULT NOTE - Initial Consult  Pharmacy Consult for heparin Indication: chest pain/ACS  Allergies  Allergen Reactions  . Sulfamethoxazole-Trimethoprim Anaphylaxis and Rash    DRESS syndrome requiring hospitalization; 12/11/2018    Patient Measurements: Height: 5\' 7"  (170.2 cm) Weight: 77.4 kg (170 lb 10.2 oz) IBW/kg (Calculated) : 66.1 Heparin Dosing Weight: 77.1 kg  Vital Signs: Temp: 98.2 F (36.8 C) (07/25 1549) Temp Source: Oral (07/25 1549) BP: 118/81 (07/25 1800) Pulse Rate: 90 (07/25 1800)  Labs: Recent Labs    05/15/20 1109 05/15/20 1109 05/15/20 1235 05/15/20 1602 05/16/20 0356 05/17/20 0222 05/17/20 1209 05/17/20 1558  HGB 13.1   < >  --  12.0* 11.9*  --   --   --   HCT 43.5  --   --  40.1 38.7*  --   --   --   PLT 331  --   --  266 259  --   --   --   APTT 27  --   --   --   --   --   --   --   LABPROT 12.2  --   --   --   --   --   --   --   INR 0.9  --   --   --   --   --   --   --   HEPARINUNFRC  --   --   --   --   --   --   --  0.63  CREATININE 1.02   < >  --  1.07 0.89 1.07  --   --   TROPONINIHS 29*   < > 26*  --   --   --  >27,000* >27,000*   < > = values in this interval not displayed.    Estimated Creatinine Clearance: 62.6 mL/min (by C-G formula based on SCr of 1.07 mg/dL).   Assessment: 13 yom who underwent cath on 7/23 finding 95% stenosis in Cx and 90% in RCA - plan for staged intervention. No AC PTA.  Troponins continue to be >27,000, awaiting intervention. HL at goal on higher end, will decrease slightly to keep in goal. ECHO completed and no LV thrombus noted. No s/sx of bleeding per RN.  Goal of Therapy:  Heparin level 0.3-0.7 units/ml Monitor platelets by anticoagulation protocol: Yes   Plan:  Decrease heparin to 900 units/hr Monitor daily HL, CBC/plt Monitor for signs/symptoms of bleeding   8/23, PharmD, BCPS, BCCP Clinical Pharmacist  Please check AMION for all [facility] Pharmacy phone numbers After  10:00 PM, call Main Pharmacy [phone]

## 2020-05-17 NOTE — Progress Notes (Signed)
EKG CRITICAL VALUE     12 lead EKG performed.  Critical value noted.  Joycie Peek, RN notified.   Alto Denver, CCT 05/17/2020 10:53 AM

## 2020-05-17 NOTE — Progress Notes (Signed)
Patient Name: Martin Mcdowell 69 year old man admitted 7/23 with chest pain and ST elevation consistent with anteroseptal/lateral MI; he had been in the ER 7/21 but because of prolonged wait times had left AMA. CATH>> LAD mid occlusion of a large wraparound LAD-PCI; residual 85% circumflex-mid (dominant), 80% proximal RCA with recommendation for staged PCI EF 35% LVEDP 28 Echo 35-40% NO LV thrombus     SUBJECTIVE:  Some recurrent chest pain-- responded to tums reminds him of previous "esophageal pain"  Chronically on steroids  Past Medical History:  Diagnosis Date   Anemia    DRESS syndrome    GERD (gastroesophageal reflux disease)    Hiatal hernia    Hypertension    Lesion of right lung    CT- multicystic right lower lobe lesion    Toxoplasmosis     Scheduled Meds:  Scheduled Meds:  aspirin EC  81 mg Oral Daily   atorvastatin  80 mg Oral q1800   Chlorhexidine Gluconate Cloth  6 each Topical Daily   heparin  5,000 Units Subcutaneous Q8H   losartan  25 mg Oral Daily   pantoprazole  40 mg Oral BID   sodium chloride flush  3 mL Intravenous Q12H   spironolactone  12.5 mg Oral Daily   ticagrelor  90 mg Oral BID   Continuous Infusions:  sodium chloride     sodium chloride     sodium chloride, acetaminophen, calcium carbonate, nitroGLYCERIN, ondansetron (ZOFRAN) IV, oxyCODONE, sodium chloride flush    PHYSICAL EXAM Vitals:   05/17/20 0600 05/17/20 0700 05/17/20 0742 05/17/20 0800  BP: (!) 79/66 (!) 100/56  (!) 90/55  Pulse: 73 72  77  Resp: 16 15  17   Temp:   98.1 F (36.7 C)   TempSrc:   Oral   SpO2: 98% 100%  100%  Weight:      Height:       Well developed and nourished in no acute distress HENT normal Neck supple with JVP-  flat   Clear Regular rate and rhythm, no murmurs or gallops Abd-soft with active BS No Clubbing cyanosis edema Skin-warm and dry A & Oriented  Grossly normal sensory and motor function    TELEMETRY: sinus    ECG this am with new STE in 2,3,F about 1 mm, profound anterior STE persists w T wave inversion     Intake/Output Summary (Last 24 hours) at 05/17/2020 1006 Last data filed at 05/17/2020 0800 Gross per 24 hour  Intake 240 ml  Output 1650 ml  Net -1410 ml    LABS: Basic Metabolic Panel: Recent Labs  Lab 05/13/20 2119 05/13/20 2119 05/15/20 1109 05/15/20 1109 05/15/20 1602 05/16/20 0356 05/17/20 0222  NA 145  --  144  --   --  140 139  K 3.9  --  3.7  --   --  3.6 3.6  CL 107  --  108  --   --  108 105  CO2 28  --  25  --   --  22 24  GLUCOSE 93  --  155*  --   --  136* 102*  BUN 25*  --  21  --   --  15 16  CREATININE 1.39*  --  1.02  --  1.07 0.89 1.07  CALCIUM 9.2   < > 9.5   < >  --  8.8* 8.7*   < > = values in this interval not displayed.   Cardiac Enzymes: No  results for input(s): CKTOTAL, CKMB, CKMBINDEX, TROPONINI in the last 72 hours. CBC: Recent Labs  Lab 05/13/20 2119 05/15/20 1109 05/15/20 1602 05/16/20 0356  WBC 10.8* 12.4* 11.1* 12.1*  NEUTROABS  --  8.0*  --   --   HGB 12.2* 13.1 12.0* 11.9*  HCT 40.5 43.5 40.1 38.7*  MCV 67.2* 67.5* 65.6* 64.8*  PLT 299 331 266 259   PROTIME: Recent Labs    05/15/20 1109  LABPROT 12.2  INR 0.9   Liver Function Tests: Recent Labs    05/15/20 1109  AST 17  ALT 50*  ALKPHOS 84  BILITOT 1.3*  PROT 7.0  ALBUMIN 3.9   No results for input(s): LIPASE, AMYLASE in the last 72 hours. BNP: BNP (last 3 results) No results for input(s): BNP in the last 8760 hours.  ProBNP (last 3 results) No results for input(s): PROBNP in the last 8760 hours.  D-Dimer: Recent Labs    05/15/20 1109  DDIMER 0.40   Hemoglobin A1C: Recent Labs    05/15/20 1109  HGBA1C 5.8*   Fasting Lipid Panel: Recent Labs    05/15/20 1109  CHOL 242*  HDL 45  LDLCALC 168*  TRIG 146  CHOLHDL 5.4   Thyroid Function Tests: No results for input(s): TSH, T4TOTAL, T3FREE, THYROIDAB in the last 72 hours.  Invalid input(s):  FREET3 Anemia Panel: No results for input(s): VITAMINB12, FOLATE, FERRITIN, TIBC, IRON, RETICCTPCT in the last 72 hours.    ECHO EF 30  ASSESSMENT AND PLAN:  Active Problems:   STEMI (ST elevation myocardial infarction) (HCC)-- (LAD) coronary artery (HCC)  Left ventricular dysfunction  REcurrent chest discomfort  Congestive heart failure-acute  Hypotension  STeroid chronic    Low BP with flat neck veins, may be mildly volume depleted-- may also be related to steroid "deficit"  Will give prednisone and reassess-- prob mild volume repletion.  Hold losartan to pm and change 25>>12.5  Concerning ECG w CP this am; repeat pending.  Anticipated staging of RCA/CX, so will  consider heparin   Echo>> No LV thrombus  Signed, Sherryl Manges MD  05/17/2020  Discussed with [facility] and reviewed strategies re BP; pain and ECG  Will volume replete, resume steroids and begin heparin

## 2020-05-17 NOTE — Progress Notes (Signed)
CRITICAL VALUE ALERT  Critical Value:  Troponin >27,000  Date & Time Notied: 05/17/2020 1415  Provider Notified: Judy Pimple, PA  Orders Received/Actions taken: None. Expected findings

## 2020-05-18 ENCOUNTER — Encounter (HOSPITAL_COMMUNITY): Payer: Self-pay | Admitting: Interventional Cardiology

## 2020-05-18 ENCOUNTER — Encounter (HOSPITAL_COMMUNITY)
Admission: EM | Disposition: A | Discharge status: Home / Self Care | Payer: Self-pay | Source: Home / Self Care | Attending: Interventional Cardiology

## 2020-05-18 DIAGNOSIS — E785 Hyperlipidemia, unspecified: Secondary | ICD-10-CM

## 2020-05-18 DIAGNOSIS — I10 Essential (primary) hypertension: Secondary | ICD-10-CM

## 2020-05-18 DIAGNOSIS — I2510 Atherosclerotic heart disease of native coronary artery without angina pectoris: Secondary | ICD-10-CM

## 2020-05-18 HISTORY — PX: CORONARY STENT INTERVENTION: CATH118234

## 2020-05-18 LAB — BASIC METABOLIC PANEL
Anion gap: 9 (ref 5–15)
BUN: 19 mg/dL (ref 8–23)
CO2: 23 mmol/L (ref 22–32)
Calcium: 8.8 mg/dL — ABNORMAL LOW (ref 8.9–10.3)
Chloride: 107 mmol/L (ref 98–111)
Creatinine, Ser: 1.04 mg/dL (ref 0.61–1.24)
GFR calc Af Amer: >60 mL/min (ref >60)
GFR calc non Af Amer: >60 mL/min (ref >60)
Glucose, Bld: 118 mg/dL — ABNORMAL HIGH (ref 70–99)
Potassium: 3.8 mmol/L (ref 3.5–5.1)
Sodium: 139 mmol/L (ref 135–145)

## 2020-05-18 LAB — CBC
HCT: 38.3 % — ABNORMAL LOW (ref 39.0–52.0)
Hemoglobin: 11.9 g/dL — ABNORMAL LOW (ref 13.0–17.0)
MCH: 20.1 pg — ABNORMAL LOW (ref 26.0–34.0)
MCHC: 31.1 g/dL (ref 30.0–36.0)
MCV: 64.6 fL — ABNORMAL LOW (ref 80.0–100.0)
Platelets: 242 10*3/uL (ref 150–400)
RBC: 5.93 MIL/uL — ABNORMAL HIGH (ref 4.22–5.81)
RDW: 16.5 % — ABNORMAL HIGH (ref 11.5–15.5)
WBC: 10.6 10*3/uL — ABNORMAL HIGH (ref 4.0–10.5)
nRBC: 0 % (ref 0.0–0.2)

## 2020-05-18 LAB — POCT ACTIVATED CLOTTING TIME
Activated Clotting Time: 395 seconds
Activated Clotting Time: 632 seconds

## 2020-05-18 LAB — HEPARIN LEVEL (UNFRACTIONATED): Heparin Unfractionated: 0.9 IU/mL — ABNORMAL HIGH (ref 0.30–0.70)

## 2020-05-18 SURGERY — CORONARY STENT INTERVENTION
Anesthesia: LOCAL

## 2020-05-18 MED ORDER — AZITHROMYCIN 250 MG PO TABS
250.0000 mg | ORAL_TABLET | Freq: Every day | ORAL | Status: DC
  Administered 2020-05-18 – 2020-05-19 (×2): 250 mg via ORAL
  Filled 2020-05-18 (×3): qty 1

## 2020-05-18 MED ORDER — SODIUM CHLORIDE 0.9 % IV SOLN: INTRAVENOUS | Status: AC

## 2020-05-18 MED ORDER — FENTANYL CITRATE (PF) 100 MCG/2ML IJ SOLN
INTRAMUSCULAR | Status: AC
  Filled 2020-05-18: qty 2

## 2020-05-18 MED ORDER — MIDAZOLAM HCL 2 MG/2ML IJ SOLN
INTRAMUSCULAR | Status: AC
  Filled 2020-05-18: qty 2

## 2020-05-18 MED ORDER — HEPARIN SODIUM (PORCINE) 1000 UNIT/ML IJ SOLN
INTRAMUSCULAR | Status: AC
  Filled 2020-05-18: qty 1

## 2020-05-18 MED ORDER — LIDOCAINE HCL (PF) 1 % IJ SOLN
INTRAMUSCULAR | Status: DC | PRN
  Administered 2020-05-18: 2 mL

## 2020-05-18 MED ORDER — HYDRALAZINE HCL 20 MG/ML IJ SOLN: 10.0000 mg | INTRAMUSCULAR | Status: AC | PRN

## 2020-05-18 MED ORDER — VERAPAMIL HCL 2.5 MG/ML IV SOLN
INTRAVENOUS | Status: DC | PRN
  Administered 2020-05-18: 10 mL via INTRA_ARTERIAL

## 2020-05-18 MED ORDER — NITROGLYCERIN 1 MG/10 ML FOR IR/CATH LAB
INTRA_ARTERIAL | Status: DC | PRN
  Administered 2020-05-18: 100 ug via INTRACORONARY

## 2020-05-18 MED ORDER — MIDAZOLAM HCL 2 MG/2ML IJ SOLN
INTRAMUSCULAR | Status: DC | PRN
  Administered 2020-05-18: 1 mg via INTRAVENOUS
  Administered 2020-05-18: 0.5 mg via INTRAVENOUS

## 2020-05-18 MED ORDER — SODIUM CHLORIDE 0.9% FLUSH: 3.0000 mL | INTRAVENOUS | Status: DC | PRN

## 2020-05-18 MED ORDER — NITROGLYCERIN 1 MG/10 ML FOR IR/CATH LAB
INTRA_ARTERIAL | Status: AC
  Filled 2020-05-18: qty 10

## 2020-05-18 MED ORDER — SODIUM CHLORIDE 0.9 % IV SOLN: 250.0000 mL | INTRAVENOUS | Status: DC | PRN

## 2020-05-18 MED ORDER — ACETAMINOPHEN 325 MG PO TABS: 650.0000 mg | ORAL_TABLET | ORAL | Status: DC | PRN

## 2020-05-18 MED ORDER — FENTANYL CITRATE (PF) 100 MCG/2ML IJ SOLN
INTRAMUSCULAR | Status: DC | PRN
  Administered 2020-05-18 (×2): 25 ug via INTRAVENOUS

## 2020-05-18 MED ORDER — HEPARIN SODIUM (PORCINE) 5000 UNIT/ML IJ SOLN
5000.0000 [IU] | Freq: Three times a day (TID) | INTRAMUSCULAR | Status: DC
  Administered 2020-05-19: 5000 [IU] via SUBCUTANEOUS
  Filled 2020-05-18: qty 1

## 2020-05-18 MED ORDER — HEPARIN (PORCINE) IN NACL 1000-0.9 UT/500ML-% IV SOLN
INTRAVENOUS | Status: AC
  Filled 2020-05-18: qty 1000

## 2020-05-18 MED ORDER — SODIUM CHLORIDE 0.9% FLUSH
3.0000 mL | Freq: Two times a day (BID) | INTRAVENOUS | Status: DC
  Administered 2020-05-18: 3 mL via INTRAVENOUS

## 2020-05-18 MED ORDER — HEPARIN SODIUM (PORCINE) 1000 UNIT/ML IJ SOLN
INTRAMUSCULAR | Status: DC | PRN
  Administered 2020-05-18: 10000 [IU] via INTRAVENOUS

## 2020-05-18 MED ORDER — LIDOCAINE HCL (PF) 1 % IJ SOLN
INTRAMUSCULAR | Status: AC
  Filled 2020-05-18: qty 30

## 2020-05-18 MED ORDER — SODIUM CHLORIDE 0.9 % IV SOLN
INTRAVENOUS | Status: AC | PRN
  Administered 2020-05-18: 500 mL via INTRAVENOUS

## 2020-05-18 MED ORDER — IOHEXOL 350 MG/ML SOLN
INTRAVENOUS | Status: AC
  Filled 2020-05-18: qty 1

## 2020-05-18 MED ORDER — HEPARIN (PORCINE) IN NACL 1000-0.9 UT/500ML-% IV SOLN
INTRAVENOUS | Status: DC | PRN
  Administered 2020-05-18 (×2): 500 mL

## 2020-05-18 MED ORDER — ONDANSETRON HCL 4 MG/2ML IJ SOLN: 4.0000 mg | Freq: Four times a day (QID) | INTRAMUSCULAR | Status: DC | PRN

## 2020-05-18 MED ORDER — LABETALOL HCL 5 MG/ML IV SOLN: 10.0000 mg | INTRAVENOUS | Status: AC | PRN

## 2020-05-18 MED ORDER — ATORVASTATIN CALCIUM 80 MG PO TABS
80.0000 mg | ORAL_TABLET | Freq: Every day | ORAL | Status: DC
  Administered 2020-05-18 – 2020-05-19 (×2): 80 mg via ORAL
  Filled 2020-05-18 (×2): qty 1

## 2020-05-18 MED ORDER — VERAPAMIL HCL 2.5 MG/ML IV SOLN
INTRAVENOUS | Status: AC
  Filled 2020-05-18: qty 2

## 2020-05-18 SURGICAL SUPPLY — 19 items
BALLN SAPPHIRE 2.0X12 (BALLOONS) ×2
BALLN SAPPHIRE 2.5X12 (BALLOONS) ×2
BALLOON SAPPHIRE 2.0X12 (BALLOONS) IMPLANT
BALLOON SAPPHIRE 2.5X12 (BALLOONS) IMPLANT
CATH VISTA GUIDE 6FR JR4 (CATHETERS) ×1 IMPLANT
CATH VISTA GUIDE 6FR XB3.5 (CATHETERS) ×1 IMPLANT
DEVICE RAD COMP TR BAND LRG (VASCULAR PRODUCTS) ×1 IMPLANT
GLIDESHEATH SLEND A-KIT 6F 22G (SHEATH) ×1 IMPLANT
GUIDEWIRE INQWIRE 1.5J.035X260 (WIRE) IMPLANT
INQWIRE 1.5J .035X260CM (WIRE) ×2
KIT ENCORE 26 ADVANTAGE (KITS) ×1 IMPLANT
KIT HEART LEFT (KITS) ×2 IMPLANT
PACK CARDIAC CATHETERIZATION (CUSTOM PROCEDURE TRAY) ×2 IMPLANT
SHEATH PROBE COVER 6X72 (BAG) ×1 IMPLANT
STENT RESOLUTE ONYX 2.0X18 (Permanent Stent) ×1 IMPLANT
STENT RESOLUTE ONYX 3.5X15 (Permanent Stent) ×1 IMPLANT
TRANSDUCER W/STOPCOCK (MISCELLANEOUS) ×2 IMPLANT
TUBING CIL FLEX 10 FLL-RA (TUBING) ×2 IMPLANT
WIRE ASAHI PROWATER 180CM (WIRE) ×1 IMPLANT

## 2020-05-18 NOTE — Interval H&P Note (Signed)
Cath Lab Visit (complete for each Cath Lab visit)  Clinical Evaluation Leading to the Procedure:   ACS: No.  Non-ACS:    Anginal Classification: CCS III  Anti-ischemic medical therapy: Minimal Therapy (1 class of medications)  Non-Invasive Test Results: No non-invasive testing performed  Prior CABG: No previous CABG      History and Physical Interval Note:  05/18/2020 9:19 AM  Martin Mcdowell  has presented today for surgery, with the diagnosis of CAD.  The various methods of treatment have been discussed with the patient and family. After consideration of risks, benefits and other options for treatment, the patient has consented to  Procedure(s): CORONARY STENT INTERVENTION (N/A) as a surgical intervention.  The patient's history has been reviewed, patient examined, no change in status, stable for surgery.  I have reviewed the patient's chart and labs.  Questions were answered to the patient's satisfaction.     Lyn Records III

## 2020-05-18 NOTE — Progress Notes (Signed)
ANTICOAGULATION CONSULT NOTE - Follow Up Consult  Pharmacy Consult for heparin Indication: MI awaiting staged PCI  Labs: Recent Labs    05/15/20 1109 05/15/20 1109 05/15/20 1235 05/15/20 1602 05/15/20 1602 05/16/20 0356 05/17/20 0222 05/17/20 1209 05/17/20 1558 05/18/20 0229  HGB 13.1   < >  --  12.0*  --  11.9*  --   --   --   --   HCT 43.5  --   --  40.1  --  38.7*  --   --   --   --   PLT 331  --   --  266  --  259  --   --   --   --   APTT 27  --   --   --   --   --   --   --   --   --   LABPROT 12.2  --   --   --   --   --   --   --   --   --   INR 0.9  --   --   --   --   --   --   --   --   --   HEPARINUNFRC  --   --   --   --   --   --   --   --  0.63 0.90*  CREATININE 1.02   < >  --  1.07   < > 0.89 1.07  --   --  1.04  TROPONINIHS 29*   < > 26*  --   --   --   --  >27,000* >27,000*  --    < > = values in this interval not displayed.    Assessment: 69yo male supratherapeutic on heparin with higher level despite decreased rate; no gtt issues or signs of bleeding per RN.  Goal of Therapy:  Heparin level 0.3-0.7 units/ml   Plan:  Will decrease heparin gtt by 2 units/kg/hr to 750 units/hr and check level in 6 hours.    Vernard Gambles, PharmD, BCPS  05/18/2020,3:46 AM

## 2020-05-18 NOTE — Progress Notes (Signed)
Progress Note  Patient Name: Martin Mcdowell Date of Encounter: 05/18/2020  [facility] Cardiologist: Dr. Daneen Schick  Subjective   .  D3 anterior STEMI treated with PCI and drug-eluting stenting via the right radial approach by Dr. Tamala Julian.  He does have residual circumflex obtuse marginal branch and dominant RCA disease.  He is remained asymptomatic over the weekend with some relative hypotension.  He is on IV heparin.  Plans are for staged intervention of residual CAD this morning by Dr. Tamala Julian.  Inpatient Medications    Scheduled Meds: . aspirin EC  81 mg Oral Daily  . atorvastatin  80 mg Oral q1800  . Chlorhexidine Gluconate Cloth  6 each Topical Daily  . pantoprazole  40 mg Oral BID  . predniSONE  20 mg Oral Q breakfast  . sodium chloride flush  3 mL Intravenous Q12H  . spironolactone  12.5 mg Oral Daily  . ticagrelor  90 mg Oral BID   Continuous Infusions: . sodium chloride    . sodium chloride    . sodium chloride    . sodium chloride 1 mL/kg/hr (05/18/20 0456)  . heparin 750 Units/hr (05/18/20 0350)   PRN Meds: sodium chloride, acetaminophen, calcium carbonate, nitroGLYCERIN, ondansetron (ZOFRAN) IV, oxyCODONE, sodium chloride flush   Vital Signs    Vitals:   05/18/20 0500 05/18/20 0600 05/18/20 0700 05/18/20 0745  BP: (!) 87/63 103/72 105/80   Pulse: 66 76 66   Resp: _0 Temp:    97.9 F (36.6 C)  TempSrc:      SpO2: 95% 99% 97%   Weight:      Height:        Intake/Output Summary (Last 24 hours) at 05/18/2020 0848 Last data filed at 05/18/2020 8485 Gross per 24 hour  Intake 711.65 ml  Output 1900 ml  Net -1188.35 ml   Last 3 Weights 05/18/2020 05/17/2020 05/16/2020  Weight (lbs) 169 lb 5 oz 170 lb 10.2 oz 170 lb 3.1 oz  Weight (kg) 76.8 kg 77.4 kg 77.2 kg      Telemetry    Sinus rhythm- Personally Reviewed  ECG    Not performed today- Personally Reviewed  Physical Exam   GEN: No acute distress.   Neck: No JVD Cardiac: RRR, no murmurs,  rubs, or gallops.  Respiratory: Clear to auscultation bilaterally. GI: Soft, nontender, non-distended  MS: No edema; No deformity. Neuro:  Nonfocal  Psych: Normal affect  Extremities: Right radial puncture site healing and stable  Labs    High Sensitivity Troponin:   Recent Labs  Lab 05/13/20 2119 05/15/20 1109 05/15/20 1235 05/17/20 1209 05/17/20 1558  TROPONINIHS 6 29* 26* >27,000* >27,000*      Chemistry Recent Labs  Lab 05/15/20 1109 05/15/20 1602 05/16/20 0356 05/17/20 0222 05/18/20 0229  NA 144  --  140 139 139  K 3.7  --  3.6 3.6 3.8  CL 108  --  108 105 107  CO2 25  --  _1 GLUCOSE 155*  --  136* 102* 118*  BUN 21  --  _2 CREATININE 1.02   < > 0.89 1.07 1.04  CALCIUM 9.5  --  8.8* 8.7* 8.8*  PROT 7.0  --   --   --   --   ALBUMIN 3.9  --   --   --   --   AST 17  --   --   --   --   ALT  50*  --   --   --   --   ALKPHOS 84  --   --   --   --   BILITOT 1.3*  --   --   --   --   GFRNONAA >60   < > >60 >60 >60  GFRAA >60   < > >60 >60 >60  ANIONGAP 11  --  _0 < > = values in this interval not displayed.     Hematology Recent Labs  Lab 05/15/20 1602 05/16/20 0356 05/18/20 0229  WBC 11.1* 12.1* 10.6*  RBC 6.11* 5.97* 5.93*  HGB 12.0* 11.9* 11.9*  HCT 40.1 38.7* 38.3*  MCV 65.6* 64.8* 64.6*  MCH 19.6* 19.9* 20.1*  MCHC 29.9* 30.7 31.1  RDW 17.4* 16.6* 16.5*  PLT 266 259 242    BNPNo results for input(s): BNP, PROBNP in the last 168 hours.   DDimer  Recent Labs  Lab 05/15/20 1109  DDIMER 0.40     Radiology    ECHOCARDIOGRAM COMPLETE  Result Date: 05/16/2020    ECHOCARDIOGRAM REPORT   Patient Name:   Martin Mcdowell Date of Exam: 05/16/2020 Medical Rec #:  [id number]   Height:       67.0 in Accession #:    [id number]  Weight:       170.2 lb Date of Birth:  1951/09/04  BSA:          1.888 m Patient Age:    69 years    BP:           114/88 mmHg Patient Gender: M           HR:           79 bpm. Exam Location:  Inpatient  Procedure: 2D Echo, Cardiac Doppler, Color Doppler and Intracardiac            Opacification Agent Indications:    CHF-Acute Systolic  History:        Patient has no prior history of Echocardiogram examinations.                 Acute MI; Risk Factors:Former Smoker.  Sonographer:    Clayton Lefort RDCS (AE) Referring Phys: [place]  1. No evidence of LV thrombus, confirmed with use of echo contrast.. Left ventricular ejection fraction, by estimation, is 35 to 40%. The left ventricle has moderately decreased function. The left ventricle demonstrates regional wall motion abnormalities (see scoring diagram/findings for description). There is mild concentric left ventricular hypertrophy. Left ventricular diastolic parameters are indeterminate.  2. Right ventricular systolic function is low normal. The right ventricular size is normal. Tricuspid regurgitation signal is inadequate for assessing PA pressure.  3. The mitral valve is normal in structure. Trivial mitral valve regurgitation. No evidence of mitral stenosis.  4. The aortic valve is grossly normal. Aortic valve regurgitation is not visualized. No aortic stenosis is present.  5. The inferior vena cava is dilated in size with <50% respiratory variability, suggesting right atrial pressure of 15 mmHg. Comparison(s): No prior Echocardiogram. Conclusion(s)/Recommendation(s): Reduced LV EF with focal wall motion abnormalities in the mid to distal LV, most prominent in mid to distal anterior and anteroseptal walls. Hypokinesis of distal inferior and lateral wall also noted. FINDINGS  Left Ventricle: No evidence of LV thrombus, confirmed with use of echo contrast. Left ventricular ejection fraction, by estimation, is 35 to 40%. The left ventricle has moderately decreased function. The left ventricle demonstrates regional  wall motion abnormalities. Definity contrast agent was given IV to delineate the left ventricular endocardial borders. The left  ventricular internal cavity size was normal in size. There is mild concentric left ventricular hypertrophy. Left ventricular diastolic parameters are indeterminate.  LV Wall Scoring: The mid and distal anterior septum and apex are akinetic. The mid and distal anterior wall, mid and distal inferior wall, apical lateral segment, and mid inferoseptal segment are hypokinetic. The antero-lateral wall, posterior wall, basal anteroseptal segment, basal anterior segment, basal inferior segment, and basal inferoseptal segment are normal. Right Ventricle: The right ventricular size is normal. No increase in right ventricular wall thickness. Right ventricular systolic function is low normal. Tricuspid regurgitation signal is inadequate for assessing PA pressure. Left Atrium: Left atrial size was normal in size. Right Atrium: Right atrial size was normal in size. Pericardium: A small pericardial effusion is present. Mitral Valve: The mitral valve is normal in structure. Trivial mitral valve regurgitation. No evidence of mitral valve stenosis. MV peak gradient, 3.4 mmHg. The mean mitral valve gradient is 1.0 mmHg. Tricuspid Valve: The tricuspid valve is normal in structure. Tricuspid valve regurgitation is trivial. No evidence of tricuspid stenosis. Aortic Valve: The aortic valve is grossly normal. Aortic valve regurgitation is not visualized. Aortic regurgitation PHT measures 1370 msec. No aortic stenosis is present. Aortic valve mean gradient measures 4.0 mmHg. Aortic valve peak gradient measures 7.0 mmHg. Aortic valve area, by VTI measures 2.10 cm. Pulmonic Valve: The pulmonic valve was not well visualized. Pulmonic valve regurgitation is not visualized. Aorta: The aortic root, ascending aorta and aortic arch are all structurally normal, with no evidence of dilitation or obstruction. Venous: The inferior vena cava is dilated in size with less than 50% respiratory variability, suggesting right atrial pressure of 15 mmHg.  IAS/Shunts: No atrial level shunt detected by color flow Doppler.  LEFT VENTRICLE PLAX 2D LVIDd:         4.10 cm  Diastology LVIDs:         2.70 cm  LV e' lateral:   6.09 cm/s LV PW:         1.20 cm  LV E/e' lateral: 7.5 LV IVS:        1.30 cm  LV e' medial:    5.22 cm/s LVOT diam:     1.80 cm  LV E/e' medial:  8.7 LV SV:         46 LV SV Index:   25 LVOT Area:     2.54 cm  RIGHT VENTRICLE            IVC RV S prime:     8.43 cm/s  IVC diam: 2.10 cm TAPSE (M-mode): 1.6 cm LEFT ATRIUM           Index       RIGHT ATRIUM           Index LA diam:      2.50 cm 1.32 cm/m  RA Area:     11.40 cm LA Vol (A2C): 41.2 ml 21.82 ml/m RA Volume:   28.00 ml  14.83 ml/m LA Vol (A4C): 42.8 ml 22.67 ml/m  AORTIC VALVE AV Area (Vmax):    1.97 cm AV Area (Vmean):   1.83 cm AV Area (VTI):     2.10 cm AV Vmax:           132.00 cm/s AV Vmean:          94.300 cm/s AV VTI:  0.221 m AV Peak Grad:      7.0 mmHg AV Mean Grad:      4.0 mmHg LVOT Vmax:         102.00 cm/s LVOT Vmean:        67.800 cm/s LVOT VTI:          0.182 m LVOT/AV VTI ratio: 0.82 AI PHT:            1370 msec  AORTA Ao Root diam: 3.40 cm Ao Asc diam:  3.40 cm MITRAL VALVE MV Area (PHT): 1.74 cm    SHUNTS MV Peak grad:  3.4 mmHg    Systemic VTI:  0.18 m MV Mean grad:  1.0 mmHg    Systemic Diam: 1.80 cm MV Vmax:       0.92 m/s MV Vmean:      45.4 cm/s MV Decel Time: 437 msec MV E velocity: 45.40 cm/s MV A velocity: 73.70 cm/s MV E/A ratio:  0.62 Buford Dresser MD Electronically signed by Buford Dresser MD Signature Date/Time: 05/16/2020/2:40:41 PM    Final     Cardiac Studies   Cardiac catheterization (05/15/2020)  Conclusion   Anterior myocardial infarction with acute severe onset of chest pain starting an hour prior to presentation at [facility].  Intermittent episodes of pain have been occurring for the previous 48 hours.  Total occlusion of the proximal to mid LAD treated with a 22 x 3.0 Onyx stent postdilated to 3.25 mm  in diameter at 13 atm.  TIMI grade III flow noted.  Decreased septal blush was noted.  Widely patent left main  Large second obtuse marginal contains segmental mid body 85% stenosis.  Ostium of the second obtuse marginal contains 50 to 60% stenosis with poststenotic dilatation in the proximal segment.  Circumflex beyond the origin of the second obtuse marginal contains 95% obstruction.  A small third obtuse marginal arises beyond.  First obtuse marginal is relatively small and contains proximal 75% narrowing.  Right coronary is dominant.  PDA is diffusely diseased greater than 90%.  Proximal to mid LAD contains eccentric 85% stenosis.  Anterior wall demonstrates severe mid anterior wall to inferoapical hypokinesis/dyskinesis with EF 35%.  LVEDP 26 mmHg.  Findings are consistent with acute systolic heart failure.  RECOMMENDATIONS:   Aggrastat x18 hours.  Aspirin and Brilinta times at least 12 months.  Continue ARB.  Add beta-blocker therapy as tolerated by hemodynamics.  Relative bradycardia and low blood pressure currently prevents addition of beta-blocker therapy.  Consider staged multisite intervention on the circumflex and right coronary on Monday depending upon hospital course.  This could also be done within the next 4 to 6 weeks to allow LV recovery.  The Doppler echocardiogram to assess LV function.  Heart failure therapy should include ARB/beta-blocker.  Consideration to MRA and SGLT2 as needed.  Coronary Diagrams  Diagnostic Dominance: Right  Intervention   Implants  2D echocardiogram (05/16/2020)  IMPRESSIONS    1. No evidence of LV thrombus, confirmed with use of echo contrast.. Left  ventricular ejection fraction, by estimation, is 35 to 40%. The left  ventricle has moderately decreased function. The left ventricle  demonstrates regional wall motion  abnormalities (see scoring diagram/findings for description). There is  mild concentric left ventricular  hypertrophy. Left ventricular diastolic  parameters are indeterminate.  2. Right ventricular systolic function is low normal. The right  ventricular size is normal. Tricuspid regurgitation signal is inadequate  for assessing PA pressure.  3. The mitral valve is  normal in structure. Trivial mitral valve  regurgitation. No evidence of mitral stenosis.  4. The aortic valve is grossly normal. Aortic valve regurgitation is not  visualized. No aortic stenosis is present.  5. The inferior vena cava is dilated in size with <50% respiratory  variability, suggesting right atrial pressure of 15 mmHg.   Patient Profile     69 y.o. male with history of hypertension who presented last Wednesday with chest pain to the emergency room but left AMA.  He returned return 2 days later with ongoing chest pain and anterior ST segment elevation was brought emergently to the Cath Lab where he underwent intervention, PCI drug-eluting stenting of his proximal LAD by Dr. Tamala Julian.  He is scheduled for staged PCI this morning.  Assessment & Plan    1: CAD-anterior STEMI on 05/15/2020.  Probably late presentation since he had chest pain 2 days prior to that.  He had successful PCI drug-eluting stenting by Dr. Tamala Julian with a 3 mm x 22 mm long resolute Onyx drug-eluting stent postdilated to 3.25 mm.  He has residual disease in his proximal circumflex obtuse marginal branch and dominant RCA as well as PDA.  He is on dual antiplatelet therapy with aspirin and Brilinta.  He scheduled scheduled for staged PCI this morning.  He will need uninterrupted dual antiplatelet therapy for at least 12 months.  2: Ischemic cardiomyopathy-EF 35 to 40% by 2D echo.  He is on losartan at home as an outpatient for hypertension.  His blood pressure is somewhat soft to restart losartan or add beta-blocker at this time.  3: Essential hypertension-blood pressure somewhat soft with systolic blood pressures in the 90-100 range.  We are holding his  losartan at the current time and will reintroduce once his blood pressure is elevated as well as begin him on low-dose beta-blocker.  4: Hyperlipidemia-total cholesterol 242 with an LDL of 168.  He is on high-dose atorvastatin.  For questions or updates, please contact [place] Please consult [email or web address] for contact info under        Signed, Quay Burow, MD  05/18/2020, 8:48 AM

## 2020-05-18 NOTE — CV Procedure (Signed)
CONCLUSION:  Proximal to mid 85% RCA successfully treated with a 3.5 x 18 Onyx deployed at high pressure and reducing stenosis to 0%.  95% distal circumflex treated with a 2.0 x 18 mm Onyx and deployed at 12 atm x 2.  0% stenosis noted with TIMI grade III flow.  Intervention was not performed on the obtuse marginal which contained tandem 50 to 70% stenoses in the mid body and 60% ostial disease.  The previously placed LAD stent during STEMI was widely patent.  LVEDP 12 mmHg before intervention performed today.  RECOMMENDATIONS:   Given normal LVEDP, will discontinue spironolactone.  Resume losartan as blood pressure allows.  Initiate beta-blocker therapy as heart rate and blood pressure allow.  Phase 2 cardiac rehab  Aggressive lipid-lowering to LDL less than 55 with assistance of SGLT2 if needed.  Eligible for discharge in a.m.  

## 2020-05-18 NOTE — Progress Notes (Signed)
TR BAND REMOVAL  LOCATION:    Right radial  DEFLATED PER PROTOCOL:    Yes.    TIME BAND OFF / DRESSING APPLIED:    1345 A clean dressing with gauze and tegaderm .   SITE UPON ARRIVAL:    Level 0  SITE AFTER BAND REMOVAL:    Level 0  CIRCULATION SENSATION AND MOVEMENT:    Within Normal Limits   Yes.    COMMENTS:   Care instructions given to patient

## 2020-05-18 NOTE — H&P (View-Only) (Signed)
Progress Note  Patient Name: Martin Mcdowell Date of Encounter: 05/18/2020  [facility] Cardiologist: Dr. Daneen Schick  Subjective   .  D3 anterior STEMI treated with PCI and drug-eluting stenting via the right radial approach by Dr. Tamala Julian.  He does have residual circumflex obtuse marginal branch and dominant RCA disease.  He is remained asymptomatic over the weekend with some relative hypotension.  He is on IV heparin.  Plans are for staged intervention of residual CAD this morning by Dr. Tamala Julian.  Inpatient Medications    Scheduled Meds: . aspirin EC  81 mg Oral Daily  . atorvastatin  80 mg Oral q1800  . Chlorhexidine Gluconate Cloth  6 each Topical Daily  . pantoprazole  40 mg Oral BID  . predniSONE  20 mg Oral Q breakfast  . sodium chloride flush  3 mL Intravenous Q12H  . spironolactone  12.5 mg Oral Daily  . ticagrelor  90 mg Oral BID   Continuous Infusions: . sodium chloride    . sodium chloride    . sodium chloride    . sodium chloride 1 mL/kg/hr (05/18/20 0456)  . heparin 750 Units/hr (05/18/20 0350)   PRN Meds: sodium chloride, acetaminophen, calcium carbonate, nitroGLYCERIN, ondansetron (ZOFRAN) IV, oxyCODONE, sodium chloride flush   Vital Signs    Vitals:   05/18/20 0500 05/18/20 0600 05/18/20 0700 05/18/20 0745  BP: (!) 87/63 103/72 105/80   Pulse: 66 76 66   Resp: _0 Temp:    97.9 F (36.6 C)  TempSrc:      SpO2: 95% 99% 97%   Weight:      Height:        Intake/Output Summary (Last 24 hours) at 05/18/2020 0848 Last data filed at 05/18/2020 8485 Gross per 24 hour  Intake 711.65 ml  Output 1900 ml  Net -1188.35 ml   Last 3 Weights 05/18/2020 05/17/2020 05/16/2020  Weight (lbs) 169 lb 5 oz 170 lb 10.2 oz 170 lb 3.1 oz  Weight (kg) 76.8 kg 77.4 kg 77.2 kg      Telemetry    Sinus rhythm- Personally Reviewed  ECG    Not performed today- Personally Reviewed  Physical Exam   GEN: No acute distress.   Neck: No JVD Cardiac: RRR, no murmurs,  rubs, or gallops.  Respiratory: Clear to auscultation bilaterally. GI: Soft, nontender, non-distended  MS: No edema; No deformity. Neuro:  Nonfocal  Psych: Normal affect  Extremities: Right radial puncture site healing and stable  Labs    High Sensitivity Troponin:   Recent Labs  Lab 05/13/20 2119 05/15/20 1109 05/15/20 1235 05/17/20 1209 05/17/20 1558  TROPONINIHS 6 29* 26* >27,000* >27,000*      Chemistry Recent Labs  Lab 05/15/20 1109 05/15/20 1602 05/16/20 0356 05/17/20 0222 05/18/20 0229  NA 144  --  140 139 139  K 3.7  --  3.6 3.6 3.8  CL 108  --  108 105 107  CO2 25  --  _1 GLUCOSE 155*  --  136* 102* 118*  BUN 21  --  _2 CREATININE 1.02   < > 0.89 1.07 1.04  CALCIUM 9.5  --  8.8* 8.7* 8.8*  PROT 7.0  --   --   --   --   ALBUMIN 3.9  --   --   --   --   AST 17  --   --   --   --   ALT  50*  --   --   --   --   ALKPHOS 84  --   --   --   --   BILITOT 1.3*  --   --   --   --   GFRNONAA >60   < > >60 >60 >60  GFRAA >60   < > >60 >60 >60  ANIONGAP 11  --  _0 < > = values in this interval not displayed.     Hematology Recent Labs  Lab 05/15/20 1602 05/16/20 0356 05/18/20 0229  WBC 11.1* 12.1* 10.6*  RBC 6.11* 5.97* 5.93*  HGB 12.0* 11.9* 11.9*  HCT 40.1 38.7* 38.3*  MCV 65.6* 64.8* 64.6*  MCH 19.6* 19.9* 20.1*  MCHC 29.9* 30.7 31.1  RDW 17.4* 16.6* 16.5*  PLT 266 259 242    BNPNo results for input(s): BNP, PROBNP in the last 168 hours.   DDimer  Recent Labs  Lab 05/15/20 1109  DDIMER 0.40     Radiology    ECHOCARDIOGRAM COMPLETE  Result Date: 05/16/2020    ECHOCARDIOGRAM REPORT   Patient Name:   Martin Mcdowell Date of Exam: 05/16/2020 Medical Rec #:  [id number]   Height:       67.0 in Accession #:    [id number]  Weight:       170.2 lb Date of Birth:  1951/09/04  BSA:          1.888 m Patient Age:    69 years    BP:           114/88 mmHg Patient Gender: M           HR:           79 bpm. Exam Location:  Inpatient  Procedure: 2D Echo, Cardiac Doppler, Color Doppler and Intracardiac            Opacification Agent Indications:    CHF-Acute Systolic  History:        Patient has no prior history of Echocardiogram examinations.                 Acute MI; Risk Factors:Former Smoker.  Sonographer:    Clayton Lefort RDCS (AE) Referring Phys: [place]  1. No evidence of LV thrombus, confirmed with use of echo contrast.. Left ventricular ejection fraction, by estimation, is 35 to 40%. The left ventricle has moderately decreased function. The left ventricle demonstrates regional wall motion abnormalities (see scoring diagram/findings for description). There is mild concentric left ventricular hypertrophy. Left ventricular diastolic parameters are indeterminate.  2. Right ventricular systolic function is low normal. The right ventricular size is normal. Tricuspid regurgitation signal is inadequate for assessing PA pressure.  3. The mitral valve is normal in structure. Trivial mitral valve regurgitation. No evidence of mitral stenosis.  4. The aortic valve is grossly normal. Aortic valve regurgitation is not visualized. No aortic stenosis is present.  5. The inferior vena cava is dilated in size with <50% respiratory variability, suggesting right atrial pressure of 15 mmHg. Comparison(s): No prior Echocardiogram. Conclusion(s)/Recommendation(s): Reduced LV EF with focal wall motion abnormalities in the mid to distal LV, most prominent in mid to distal anterior and anteroseptal walls. Hypokinesis of distal inferior and lateral wall also noted. FINDINGS  Left Ventricle: No evidence of LV thrombus, confirmed with use of echo contrast. Left ventricular ejection fraction, by estimation, is 35 to 40%. The left ventricle has moderately decreased function. The left ventricle demonstrates regional  wall motion abnormalities. Definity contrast agent was given IV to delineate the left ventricular endocardial borders. The left  ventricular internal cavity size was normal in size. There is mild concentric left ventricular hypertrophy. Left ventricular diastolic parameters are indeterminate.  LV Wall Scoring: The mid and distal anterior septum and apex are akinetic. The mid and distal anterior wall, mid and distal inferior wall, apical lateral segment, and mid inferoseptal segment are hypokinetic. The antero-lateral wall, posterior wall, basal anteroseptal segment, basal anterior segment, basal inferior segment, and basal inferoseptal segment are normal. Right Ventricle: The right ventricular size is normal. No increase in right ventricular wall thickness. Right ventricular systolic function is low normal. Tricuspid regurgitation signal is inadequate for assessing PA pressure. Left Atrium: Left atrial size was normal in size. Right Atrium: Right atrial size was normal in size. Pericardium: A small pericardial effusion is present. Mitral Valve: The mitral valve is normal in structure. Trivial mitral valve regurgitation. No evidence of mitral valve stenosis. MV peak gradient, 3.4 mmHg. The mean mitral valve gradient is 1.0 mmHg. Tricuspid Valve: The tricuspid valve is normal in structure. Tricuspid valve regurgitation is trivial. No evidence of tricuspid stenosis. Aortic Valve: The aortic valve is grossly normal. Aortic valve regurgitation is not visualized. Aortic regurgitation PHT measures 1370 msec. No aortic stenosis is present. Aortic valve mean gradient measures 4.0 mmHg. Aortic valve peak gradient measures 7.0 mmHg. Aortic valve area, by VTI measures 2.10 cm. Pulmonic Valve: The pulmonic valve was not well visualized. Pulmonic valve regurgitation is not visualized. Aorta: The aortic root, ascending aorta and aortic arch are all structurally normal, with no evidence of dilitation or obstruction. Venous: The inferior vena cava is dilated in size with less than 50% respiratory variability, suggesting right atrial pressure of 15 mmHg.  IAS/Shunts: No atrial level shunt detected by color flow Doppler.  LEFT VENTRICLE PLAX 2D LVIDd:         4.10 cm  Diastology LVIDs:         2.70 cm  LV e' lateral:   6.09 cm/s LV PW:         1.20 cm  LV E/e' lateral: 7.5 LV IVS:        1.30 cm  LV e' medial:    5.22 cm/s LVOT diam:     1.80 cm  LV E/e' medial:  8.7 LV SV:         46 LV SV Index:   25 LVOT Area:     2.54 cm  RIGHT VENTRICLE            IVC RV S prime:     8.43 cm/s  IVC diam: 2.10 cm TAPSE (M-mode): 1.6 cm LEFT ATRIUM           Index       RIGHT ATRIUM           Index LA diam:      2.50 cm 1.32 cm/m  RA Area:     11.40 cm LA Vol (A2C): 41.2 ml 21.82 ml/m RA Volume:   28.00 ml  14.83 ml/m LA Vol (A4C): 42.8 ml 22.67 ml/m  AORTIC VALVE AV Area (Vmax):    1.97 cm AV Area (Vmean):   1.83 cm AV Area (VTI):     2.10 cm AV Vmax:           132.00 cm/s AV Vmean:          94.300 cm/s AV VTI:  0.221 m AV Peak Grad:      7.0 mmHg AV Mean Grad:      4.0 mmHg LVOT Vmax:         102.00 cm/s LVOT Vmean:        67.800 cm/s LVOT VTI:          0.182 m LVOT/AV VTI ratio: 0.82 AI PHT:            1370 msec  AORTA Ao Root diam: 3.40 cm Ao Asc diam:  3.40 cm MITRAL VALVE MV Area (PHT): 1.74 cm    SHUNTS MV Peak grad:  3.4 mmHg    Systemic VTI:  0.18 m MV Mean grad:  1.0 mmHg    Systemic Diam: 1.80 cm MV Vmax:       0.92 m/s MV Vmean:      45.4 cm/s MV Decel Time: 437 msec MV E velocity: 45.40 cm/s MV A velocity: 73.70 cm/s MV E/A ratio:  0.62 Buford Dresser MD Electronically signed by Buford Dresser MD Signature Date/Time: 05/16/2020/2:40:41 PM    Final     Cardiac Studies   Cardiac catheterization (05/15/2020)  Conclusion   Anterior myocardial infarction with acute severe onset of chest pain starting an hour prior to presentation at [facility].  Intermittent episodes of pain have been occurring for the previous 48 hours.  Total occlusion of the proximal to mid LAD treated with a 22 x 3.0 Onyx stent postdilated to 3.25 mm  in diameter at 13 atm.  TIMI grade III flow noted.  Decreased septal blush was noted.  Widely patent left main  Large second obtuse marginal contains segmental mid body 85% stenosis.  Ostium of the second obtuse marginal contains 50 to 60% stenosis with poststenotic dilatation in the proximal segment.  Circumflex beyond the origin of the second obtuse marginal contains 95% obstruction.  A small third obtuse marginal arises beyond.  First obtuse marginal is relatively small and contains proximal 75% narrowing.  Right coronary is dominant.  PDA is diffusely diseased greater than 90%.  Proximal to mid LAD contains eccentric 85% stenosis.  Anterior wall demonstrates severe mid anterior wall to inferoapical hypokinesis/dyskinesis with EF 35%.  LVEDP 26 mmHg.  Findings are consistent with acute systolic heart failure.  RECOMMENDATIONS:   Aggrastat x18 hours.  Aspirin and Brilinta times at least 12 months.  Continue ARB.  Add beta-blocker therapy as tolerated by hemodynamics.  Relative bradycardia and low blood pressure currently prevents addition of beta-blocker therapy.  Consider staged multisite intervention on the circumflex and right coronary on Monday depending upon hospital course.  This could also be done within the next 4 to 6 weeks to allow LV recovery.  The Doppler echocardiogram to assess LV function.  Heart failure therapy should include ARB/beta-blocker.  Consideration to MRA and SGLT2 as needed.  Coronary Diagrams  Diagnostic Dominance: Right  Intervention   Implants  2D echocardiogram (05/16/2020)  IMPRESSIONS    1. No evidence of LV thrombus, confirmed with use of echo contrast.. Left  ventricular ejection fraction, by estimation, is 35 to 40%. The left  ventricle has moderately decreased function. The left ventricle  demonstrates regional wall motion  abnormalities (see scoring diagram/findings for description). There is  mild concentric left ventricular  hypertrophy. Left ventricular diastolic  parameters are indeterminate.  2. Right ventricular systolic function is low normal. The right  ventricular size is normal. Tricuspid regurgitation signal is inadequate  for assessing PA pressure.  3. The mitral valve is  normal in structure. Trivial mitral valve  regurgitation. No evidence of mitral stenosis.  4. The aortic valve is grossly normal. Aortic valve regurgitation is not  visualized. No aortic stenosis is present.  5. The inferior vena cava is dilated in size with <50% respiratory  variability, suggesting right atrial pressure of 15 mmHg.   Patient Profile     69 y.o. male with history of hypertension who presented last Wednesday with chest pain to the emergency room but left AMA.  He returned return 2 days later with ongoing chest pain and anterior ST segment elevation was brought emergently to the Cath Lab where he underwent intervention, PCI drug-eluting stenting of his proximal LAD by Dr. Tamala Julian.  He is scheduled for staged PCI this morning.  Assessment & Plan    1: CAD-anterior STEMI on 05/15/2020.  Probably late presentation since he had chest pain 2 days prior to that.  He had successful PCI drug-eluting stenting by Dr. Tamala Julian with a 3 mm x 22 mm long resolute Onyx drug-eluting stent postdilated to 3.25 mm.  He has residual disease in his proximal circumflex obtuse marginal branch and dominant RCA as well as PDA.  He is on dual antiplatelet therapy with aspirin and Brilinta.  He scheduled scheduled for staged PCI this morning.  He will need uninterrupted dual antiplatelet therapy for at least 12 months.  2: Ischemic cardiomyopathy-EF 35 to 40% by 2D echo.  He is on losartan at home as an outpatient for hypertension.  His blood pressure is somewhat soft to restart losartan or add beta-blocker at this time.  3: Essential hypertension-blood pressure somewhat soft with systolic blood pressures in the 90-100 range.  We are holding his  losartan at the current time and will reintroduce once his blood pressure is elevated as well as begin him on low-dose beta-blocker.  4: Hyperlipidemia-total cholesterol 242 with an LDL of 168.  He is on high-dose atorvastatin.  For questions or updates, please contact [place] Please consult [email or web address] for contact info under        Signed, Quay Burow, MD  05/18/2020, 8:48 AM

## 2020-05-19 ENCOUNTER — Encounter (HOSPITAL_COMMUNITY): Payer: Self-pay | Admitting: Interventional Cardiology

## 2020-05-19 ENCOUNTER — Telehealth: Payer: Self-pay | Admitting: Interventional Cardiology

## 2020-05-19 ENCOUNTER — Encounter: Payer: Self-pay | Admitting: Physician Assistant

## 2020-05-19 DIAGNOSIS — I25110 Atherosclerotic heart disease of native coronary artery with unstable angina pectoris: Secondary | ICD-10-CM

## 2020-05-19 DIAGNOSIS — E785 Hyperlipidemia, unspecified: Secondary | ICD-10-CM

## 2020-05-19 LAB — CBC
HCT: 37 % — ABNORMAL LOW (ref 39.0–52.0)
Hemoglobin: 11.4 g/dL — ABNORMAL LOW (ref 13.0–17.0)
MCH: 20 pg — ABNORMAL LOW (ref 26.0–34.0)
MCHC: 30.8 g/dL (ref 30.0–36.0)
MCV: 64.9 fL — ABNORMAL LOW (ref 80.0–100.0)
Platelets: 246 10*3/uL (ref 150–400)
RBC: 5.7 MIL/uL (ref 4.22–5.81)
RDW: 15.9 % — ABNORMAL HIGH (ref 11.5–15.5)
WBC: 12.2 10*3/uL — ABNORMAL HIGH (ref 4.0–10.5)
nRBC: 0 % (ref 0.0–0.2)

## 2020-05-19 LAB — BASIC METABOLIC PANEL
Anion gap: 8 (ref 5–15)
BUN: 17 mg/dL (ref 8–23)
CO2: 24 mmol/L (ref 22–32)
Calcium: 8.6 mg/dL — ABNORMAL LOW (ref 8.9–10.3)
Chloride: 108 mmol/L (ref 98–111)
Creatinine, Ser: 1.25 mg/dL — ABNORMAL HIGH (ref 0.61–1.24)
GFR calc Af Amer: >60 mL/min (ref >60)
GFR calc non Af Amer: 59 mL/min — ABNORMAL LOW (ref >60)
Glucose, Bld: 94 mg/dL (ref 70–99)
Potassium: 3.7 mmol/L (ref 3.5–5.1)
Sodium: 140 mmol/L (ref 135–145)

## 2020-05-19 MED ORDER — METOPROLOL SUCCINATE ER 25 MG PO TB24: 12.5000 mg | ORAL_TABLET | Freq: Every day | ORAL | 3 refills | Status: DC

## 2020-05-19 MED ORDER — TICAGRELOR 90 MG PO TABS: 90.0000 mg | ORAL_TABLET | Freq: Two times a day (BID) | ORAL | 0 refills | Status: DC

## 2020-05-19 MED ORDER — TICAGRELOR 90 MG PO TABS: 90.0000 mg | ORAL_TABLET | Freq: Two times a day (BID) | ORAL | 3 refills | Status: DC

## 2020-05-19 MED ORDER — ASPIRIN 81 MG PO TBEC: 81.0000 mg | DELAYED_RELEASE_TABLET | Freq: Every day | ORAL | Status: DC

## 2020-05-19 MED ORDER — ATORVASTATIN CALCIUM 80 MG PO TABS: 80.0000 mg | ORAL_TABLET | Freq: Every day | ORAL | 3 refills | Status: DC

## 2020-05-19 MED ORDER — NITROGLYCERIN 0.4 MG SL SUBL: 0.4000 mg | SUBLINGUAL_TABLET | SUBLINGUAL | 3 refills | Status: DC | PRN

## 2020-05-19 MED ORDER — METOPROLOL SUCCINATE ER 25 MG PO TB24
12.5000 mg | ORAL_TABLET | Freq: Every day | ORAL | Status: DC
  Administered 2020-05-19: 12.5 mg via ORAL
  Filled 2020-05-19: qty 1

## 2020-05-19 MED FILL — NITROGLYCERIN 0.4 MG TAB SL: 0.4 | 7 days supply | Qty: 25 | Fill #0

## 2020-05-19 MED FILL — BRILINTA 90 MG TABLET: 90 | 30 days supply | Qty: 60 | Fill #0

## 2020-05-19 MED FILL — METOPROLOL SUCCINATE ER 25: 25 | 30 days supply | Qty: 15 | Fill #0

## 2020-05-19 MED FILL — ATORVASTATIN CALCIUM 80 MG: 80 | 30 days supply | Qty: 30 | Fill #0

## 2020-05-19 NOTE — Telephone Encounter (Signed)
TOC Martin Mcdowell-  Please call Martin Mcdowell-  Pt have an appt with Dr Katrinka Blazing on 06-01-20

## 2020-05-19 NOTE — Progress Notes (Addendum)
Progress Note  Patient Name: Martin Mcdowell Date of Encounter: 05/19/2020  [facility] Cardiologist: Sinclair Grooms, MD   Subjective   Denies any CP or SOB.   Inpatient Medications    Scheduled Meds:  aspirin EC  81 mg Oral Daily   atorvastatin  80 mg Oral Daily   azithromycin  250 mg Oral Daily   Chlorhexidine Gluconate Cloth  6 each Topical Daily   heparin  5,000 Units Subcutaneous Q8H   pantoprazole  40 mg Oral BID   predniSONE  20 mg Oral Q breakfast   sodium chloride flush  3 mL Intravenous Q12H   ticagrelor  90 mg Oral BID   Continuous Infusions:  sodium chloride     sodium chloride     PRN Meds: sodium chloride, acetaminophen, calcium carbonate, nitroGLYCERIN, ondansetron (ZOFRAN) IV, ondansetron (ZOFRAN) IV, oxyCODONE, sodium chloride flush   Vital Signs    Vitals:   05/18/20 1315 05/18/20 1500 05/18/20 1937 05/19/20 0528  BP: 99/77 98/75 109/80 105/68  Pulse: 76 74 82 85  Resp: '20 14 15 16  ' Temp:  98.7 F (37.1 C) 98.5 F (36.9 C) 98.2 F (36.8 C)  TempSrc:  Oral Oral Oral  SpO2: 99% 99% 99% 97%  Weight:    77.5 kg  Height:        Intake/Output Summary (Last 24 hours) at 05/19/2020 0920 Last data filed at 05/19/2020 0818 Gross per 24 hour  Intake 243 ml  Output --  Net 243 ml   Last 3 Weights 05/19/2020 05/18/2020 05/17/2020  Weight (lbs) 170 lb 14.4 oz 169 lb 5 oz 170 lb 10.2 oz  Weight (kg) 77.52 kg 76.8 kg 77.4 kg      Telemetry    NSR without significant ventricular ectopy - Personally Reviewed  ECG    NSR with ST elevation in anterior leads - Personally Reviewed  Physical Exam   GEN: No acute distress.   Neck: No JVD Cardiac: RRR, no murmurs, rubs, or gallops.  Respiratory: Clear to auscultation bilaterally. GI: Soft, nontender, non-distended  MS: No edema; No deformity. Neuro:  Nonfocal  Psych: Normal affect   Labs    High Sensitivity Troponin:   Recent Labs  Lab 05/13/20 2119 05/15/20 1109 05/15/20 1235  05/17/20 1209 05/17/20 1558  TROPONINIHS 6 29* 26* >27,000* >27,000*      Chemistry Recent Labs  Lab 05/15/20 1109 05/15/20 1602 05/17/20 0222 05/18/20 0229 05/19/20 0401  NA 144   < > 139 139 140  K 3.7   < > 3.6 3.8 3.7  CL 108   < > 105 107 108  CO2 25   < > '24 23 24  ' GLUCOSE 155*   < > 102* 118* 94  BUN 21   < > '16 19 17  ' CREATININE 1.02   < > 1.07 1.04 1.25*  CALCIUM 9.5   < > 8.7* 8.8* 8.6*  PROT 7.0  --   --   --   --   ALBUMIN 3.9  --   --   --   --   AST 17  --   --   --   --   ALT 50*  --   --   --   --   ALKPHOS 84  --   --   --   --   BILITOT 1.3*  --   --   --   --   GFRNONAA >60   < > >60 >60  59*  GFRAA >60   < > >60 >60 >60  ANIONGAP 11   < > '10 9 8   ' < > = values in this interval not displayed.     Hematology Recent Labs  Lab 05/16/20 0356 05/18/20 0229 05/19/20 0401  WBC 12.1* 10.6* 12.2*  RBC 5.97* 5.93* 5.70  HGB 11.9* 11.9* 11.4*  HCT 38.7* 38.3* 37.0*  MCV 64.8* 64.6* 64.9*  MCH 19.9* 20.1* 20.0*  MCHC 30.7 31.1 30.8  RDW 16.6* 16.5* 15.9*  PLT 259 242 246    BNPNo results for input(s): BNP, PROBNP in the last 168 hours.   DDimer  Recent Labs  Lab 05/15/20 1109  DDIMER 0.40     Radiology    CARDIAC CATHETERIZATION  Result Date: 05/18/2020  Proximal to mid 85% RCA successfully treated with a 3.5 x 18 Onyx deployed at high pressure and reducing stenosis to 0%.  95% distal circumflex treated with a 2.0 x 18 mm Onyx and deployed at 12 atm x 2.  0% stenosis noted with TIMI grade III flow.  Intervention was not performed on the obtuse marginal which contained tandem 50 to 70% stenoses in the mid body and 60% ostial disease.  The previously placed LAD stent during STEMI was widely patent.  LVEDP 12 mmHg before intervention performed today. RECOMMENDATIONS:  Given normal LVEDP, will discontinue spironolactone.  Resume losartan as blood pressure allows.  Initiate beta-blocker therapy as heart rate and blood pressure allow.  Phase 2  cardiac rehab  Aggressive lipid-lowering to LDL less than 55 with assistance of SGLT2 if needed.  Eligible for discharge in a.m.   Cardiac Studies   Cath 05/15/2020 Anterior myocardial infarction with acute severe onset of chest pain starting an hour prior to presentation at [facility].  Intermittent episodes of pain have been occurring for the previous 48 hours. Total occlusion of the proximal to mid LAD treated with a 22 x 3.0 Onyx stent postdilated to 3.25 mm in diameter at 13 atm.  TIMI grade III flow noted.  Decreased septal blush was noted. Widely patent left main Large second obtuse marginal contains segmental mid body 85% stenosis.  Ostium of the second obtuse marginal contains 50 to 60% stenosis with poststenotic dilatation in the proximal segment.  Circumflex beyond the origin of the second obtuse marginal contains 95% obstruction.  A small third obtuse marginal arises beyond.  First obtuse marginal is relatively small and contains proximal 75% narrowing. Right coronary is dominant.  PDA is diffusely diseased greater than 90%.  Proximal to mid LAD contains eccentric 85% stenosis. Anterior wall demonstrates severe mid anterior wall to inferoapical hypokinesis/dyskinesis with EF 35%.  LVEDP 26 mmHg.  Findings are consistent with acute systolic heart failure.   RECOMMENDATIONS:   Aggrastat x18 hours. Aspirin and Brilinta times at least 12 months. Continue ARB.  Add beta-blocker therapy as tolerated by hemodynamics.  Relative bradycardia and low blood pressure currently prevents addition of beta-blocker therapy. Consider staged multisite intervention on the circumflex and right coronary on Monday depending upon hospital course.  This could also be done within the next 4 to 6 weeks to allow LV recovery. The Doppler echocardiogram to assess LV function.  Heart failure therapy should include ARB/beta-blocker.  Consideration to MRA and SGLT2 as needed.     Echo 05/16/2020 1. No  evidence of LV thrombus, confirmed with use of echo contrast.. Left  ventricular ejection fraction, by estimation, is 35 to 40%. The left  ventricle has moderately  decreased function. The left ventricle  demonstrates regional wall motion  abnormalities (see scoring diagram/findings for description). There is  mild concentric left ventricular hypertrophy. Left ventricular diastolic  parameters are indeterminate.   2. Right ventricular systolic function is low normal. The right  ventricular size is normal. Tricuspid regurgitation signal is inadequate  for assessing PA pressure.   3. The mitral valve is normal in structure. Trivial mitral valve  regurgitation. No evidence of mitral stenosis.   4. The aortic valve is grossly normal. Aortic valve regurgitation is not  visualized. No aortic stenosis is present.   5. The inferior vena cava is dilated in size with <50% respiratory  variability, suggesting right atrial pressure of 15 mmHg.     Cath 05/18/2020 Proximal to mid 85% RCA successfully treated with a 3.5 x 18 Onyx deployed at high pressure and reducing stenosis to 0%. 95% distal circumflex treated with a 2.0 x 18 mm Onyx and deployed at 12 atm x 2.  0% stenosis noted with TIMI grade III flow. Intervention was not performed on the obtuse marginal which contained tandem 50 to 70% stenoses in the mid body and 60% ostial disease. The previously placed LAD stent during STEMI was widely patent. LVEDP 12 mmHg before intervention performed today.   RECOMMENDATIONS:   Given normal LVEDP, will discontinue spironolactone. Resume losartan as blood pressure allows. Initiate beta-blocker therapy as heart rate and blood pressure allow. Phase 2 cardiac rehab Aggressive lipid-lowering to LDL less than 55 with assistance of SGLT2 if needed. Eligible for discharge in a.m.   Patient Profile     69 y.o. male with PMH of HTN, GERD, hiatal hernia and DRESS syndrome presented as anterior STEMI.    Assessment & Plan    1. STEMI  - initially evaluated on 7/21 in the ED, however left AMA due to wait time. Returned on 7/23 with worsening chest pain and ST changes in the anterior leads  - Cardiac cath 7/23 showed mid LAD occlusion treated with DES, 85% mid OM, 95% distal LCx, 80% prox to mid RCA, EF 35%  - Staged PCI 7/26 DES to prox to mid RCA and distal LCx  - start on metoprolol succinate 12.68m daily. Stable for discharge.   2. HTN: start on metoprolol succinate 12.545mdaily.  3. HLD: on lipitor  4. GERD  5. DRESS syndrome: on azithromycin and prednisone  For questions or updates, please contact [place] consult [email or web address] for contact info under        Signed, HaAlmyra DeforestPARoxie7/27/2021, 9:20 AM    Agree with note by HaAlmyra DeforestA-C  Stable for DC this AM. POD # 4 Ant STEMI (late presentation) with PCI/DES LAD by Dr SmTamala JulianStaged RCA and AVG LCX intervention yesterday. EF 35-40% by 2D. Otherwise on GDOMT. Low dose BB, losartan, high dose Atorva. TOC 7 the ROV with DR SmTamala Julian  JoLorretta HarpM.D., FALewiston WoodvilleFASouth Big Horn County Critical Access HospitalFALaverta BaltimoreSDillsboro28[address] High CourtSuGilbertNC  [id number]/27/2021 9:47 AM

## 2020-05-19 NOTE — Discharge Instructions (Signed)
Heart Attack The heart is a muscle that needs oxygen to survive. A heart attack is a condition that occurs when your heart does not get enough oxygen. When this happens, the heart muscle begins to die. This can cause permanent damage if not treated right away. A heart attack is a medical emergency. This condition may be called a myocardial infarction, or MI. It is also known as acute coronary syndrome (ACS). ACS is a term used to describe a group of conditions that affect blood flow to the heart. What are the causes? This condition may be caused by:  Atherosclerosis. This occurs when a fatty substance called plaque builds up in the arteries and blocks or reduces blood supply to the heart.  A blood clot. A blood clot can develop suddenly when plaque breaks up within an artery and blocks blood flow to the heart.  Low blood pressure.  An abnormal heartbeat (arrhythmia).  Conditions that cause a decrease of oxygen to the heart, such as anemiaorrespiratory failure.  A spasm, or severe tightening, of a blood vessel that cuts off blood flow to the heart.  Tearing of a coronary artery (spontaneous coronary artery dissection).  High blood pressure. What increases the risk? The following factors may make you more likely to develop this condition:  Aging. The older you are, the higher your risk.  Having a personal or family history of chest pain, heart attack, stroke, or narrowing of the arteries in the legs, arms, head, or stomach (peripheral artery disease).  Being male.  Smoking.  Not getting regular exercise.  Being overweight or obese.  Having high blood pressure.  Having high cholesterol (hypercholesterolemia).  Having diabetes.  Drinking too much alcohol.  Using illegal drugs, such as cocaine or methamphetamine. What are the signs or symptoms? Symptoms of this condition may vary, depending on factors like gender and age. Symptoms may include:  Chest pain. It may feel  like: ? Crushing or squeezing. ? Tightness, pressure, fullness, or heaviness.  Pain in the arm, neck, jaw, back, or upper body.  Shortness of breath.  Heartburn or upset stomach.  Nausea.  Sudden cold sweats.  Feeling tired.  Sudden light-headedness. How is this diagnosed? This condition may be diagnosed through tests, such as:  Electrocardiogram (ECG) to measure the electrical activity of your heart.  Blood tests to check for cardiac markers. These chemicals are released by a damaged heart muscle.  A test to evaluate blood flow and heart function (coronary angiogram).  CT scan to see the heart more clearly.  A test to evaluate the pumping action of the heart (echocardiogram). How is this treated? A heart attack must be treated as soon as possible. Treatment may include:  Medicines to: ? Break up or dissolve blood clots (fibrinolytic therapy). ? Thin blood and help prevent blood clots. ? Treat blood pressure. ? Improve blood flow to the heart. ? Reduce pain. ? Reduce cholesterol.  Angioplasty and stent placement. These are procedures to widen a blocked artery and keep it open.  Coronary artery bypass graft, CABG, or open heart surgery. This enables blood to flow to the heart by going around the blocked part of the artery.  Oxygen therapy if needed.  Cardiac rehabilitation. This improves your health and well-being through exercise, education, and counseling. Follow these instructions at home: Medicines  Take over-the-counter and prescription medicines only as told by your health care provider.  Do not take the following medicines unless your health care provider says it is okay   to take them: ? NSAIDs, such as ibuprofen. ? Supplements that contain vitamin A, vitamin E, or both. ? Hormone replacement therapy that contains estrogen with or without progestin. Lifestyle   Do not use any products that contain nicotine or tobacco, such as cigarettes, e-cigarettes,  and chewing tobacco. If you need help quitting, ask your health care provider.  Avoid secondhand smoke.  Exercise regularly. Ask your health care provider about participating in a cardiac rehabilitation program that helps you start exercising safely after a heart attack.  Eat a heart-healthy diet. Your health care provider will tell you what foods to eat.  Maintain a healthy weight.  Learn ways to manage stress.  Do not use illegal drugs. Alcohol use  Do not drink alcohol if: ? Your health care provider tells you not to drink. ? You are pregnant, may be pregnant, or are planning to become pregnant.  If you drink alcohol: ? Limit how much you use to:  0-1 drink a day for women.  0-2 drinks a day for men. ? Be aware of how much alcohol is in your drink. In the [place] one drink equals one 12 oz bottle of beer (355 mL), one 5 oz glass of wine (148 mL), or one 1 oz glass of hard liquor (44 mL). General instructions  Work with your health care provider to manage any other conditions you have, such as high blood pressure or diabetes. These conditions affect your heart.  Get screened for depression, and seek treatment if needed.  Keep your vaccinations up to date. Get the flu vaccine every year.  Keep all follow-up visits as told by your health care provider. This is important. Contact a health care provider if:  You feel overwhelmed or sad.  You have trouble doing your daily activities. Get help right away if:  You have sudden, unexplained discomfort in your chest, arms, back, neck, jaw, or upper body.  You have shortness of breath.  You suddenly start to sweat or your skin gets clammy.  You feel nauseous or you vomit.  You have unexplained tiredness or weakness.  You suddenly feel light-headed or dizzy.  You notice your heart starts to beat fast or feels like it is skipping beats.  You have blood pressure that is higher than 180/120. These symptoms may represent a  serious problem that is an emergency. Do not wait to see if the symptoms will go away. Get medical help right away. Call your local emergency services (911 in the [place] Do not drive yourself to the hospital. Summary  A heart attack, also called myocardial infarction, is a condition that occurs when your heart does not get enough oxygen. This is caused by anything that blocks or reduces blood flow to the heart.  Treatment is a combination of medicines and surgeries, if needed, to open the blocked arteries and restore blood flow to the heart.  A heart attack is an emergency. Get help right away if you have sudden discomfort in your chest, arms, back, neck, jaw, or upper body. Seek help if you feel nauseous, you vomit, or you feel light-headed or dizzy. This information is not intended to replace advice given to you by your health care provider. Make sure you discuss any questions you have with your health care provider. Document Revised: 01/17/2019 Document Reviewed: 01/21/2019 Elsevier Patient Education  2020 [employer]  

## 2020-05-19 NOTE — Progress Notes (Signed)
CARDIAC REHAB PHASE I   PRE:  Rate/Rhythm: 80 SR    BP: sitting 98/79    SaO2:   MODE:  Ambulation: 470 ft   POST:  Rate/Rhythm: 95 SR    BP: sitting 105/71     SaO2:   Ambulated without difficulty. Sts he is fatigued from lack of sleep. Discussed MI, stent, Brilinta importance, diet, exercise, NTG and CRPII. Pt receptive. Will refer to [place] CRPII. He might be better suited with virtual due to work.  Pt is interested in participating in Virtual Cardiac and Pulmonary Rehab. Pt advised that Virtual Cardiac and Pulmonary Rehab is provided at no cost to the patient.  Checklist:  1. Pt has smart device  ie smartphone and/or ipad for downloading an app  Yes 2. Reliable internet/wifi service    Yes 3. Understands how to use their smartphone and navigate within an app.  Yes Pt verbalized understanding and is in agreement.  [id number]  Harriet Masson CES, ACSM 05/19/2020 11:07 AM

## 2020-05-19 NOTE — Care Management Important Message (Signed)
Important Message  Patient Details  Name: Martin Mcdowell MRN: [id number] Date of Birth: 20-Jul-1951   Medicare Important Message Given:  Yes     Renie Ora 05/19/2020, 10:53 AM

## 2020-05-19 NOTE — Progress Notes (Signed)
   Primary Cardiologist: Lesleigh Noe, MD  Mr. Martin Mcdowell (DOB November 08, 1950) was admitted to the [facility] from 05/15/2020 until 05/19/2020. He was also in the [facility] ED on 05/13/2020. Given the procedure that was done in the hospital, we recommend him to hold off on returning to work for 2 weeks until he can be seen on follow up by Dr. Katrinka Blazing.   Thank you for your understanding. Please contact us for any questions  Azalee Course, PA  [facility] [phone] 05/19/2020, 12:45 PM

## 2020-05-19 NOTE — TOC Benefit Eligibility Note (Signed)
Transition of Care [facility]) Benefit Eligibility Note    Patient Details  Name: Martin Mcdowell MRN: [id number] Date of Birth: August 31, 1951   Medication/Dose: Marden Noble 90mg  BID  Covered?: Yes     Prescription Coverage [facility]erred [facility]: [facility]  Spoke with Person/Company/Phone Number:: [facility] [facility] [employer]  Co-Pay: $711.12 for 30 day retail  Prior Approval: No  Deductible: Unmet ($281.22)       10-06-1982 Vannary Greening Phone Number: 05/19/2020, 10:56 AM

## 2020-05-19 NOTE — Discharge Summary (Addendum)
Discharge Summary    Patient ID: Martin Mcdowell MRN: [id number]; DOB: 11-29-1950  Admit date: 05/15/2020 Discharge date: 05/19/2020  Primary Care Provider: System, Pcp Not In  Primary Cardiologist: Martin Grooms, MD  Primary Electrophysiologist:  None   Discharge Diagnoses    Principal Problem:   CAD (coronary artery disease), native coronary artery Active Problems:   STEMI (ST elevation myocardial infarction) ([place])   Acute ST elevation myocardial infarction (STEMI) involving left anterior descending (LAD) coronary artery ([place])   Hyperlipidemia LDL goal <70   Diagnostic Studies/Procedures    Cath 05/15/2020 Anterior myocardial infarction with acute severe onset of chest pain starting an hour prior to presentation at [facility].  Intermittent episodes of pain have been occurring for the previous 48 hours. Total occlusion of the proximal to mid LAD treated with a 22 x 3.0 Onyx stent postdilated to 3.25 mm in diameter at 13 atm.  TIMI grade III flow noted.  Decreased septal blush was noted. Widely patent left main Large second obtuse marginal contains segmental mid body 85% stenosis.  Ostium of the second obtuse marginal contains 50 to 60% stenosis with poststenotic dilatation in the proximal segment.  Circumflex beyond the origin of the second obtuse marginal contains 95% obstruction.  A small third obtuse marginal arises beyond.  First obtuse marginal is relatively small and contains proximal 75% narrowing. Right coronary is dominant.  PDA is diffusely diseased greater than 90%.  Proximal to mid LAD contains eccentric 85% stenosis. Anterior wall demonstrates severe mid anterior wall to inferoapical hypokinesis/dyskinesis with EF 35%.  LVEDP 26 mmHg.  Findings are consistent with acute systolic heart failure.   RECOMMENDATIONS:   Aggrastat x18 hours. Aspirin and Brilinta times at least 12 months. Continue ARB.  Add beta-blocker therapy as tolerated by hemodynamics.   Relative bradycardia and low blood pressure currently prevents addition of beta-blocker therapy. Consider staged multisite intervention on the circumflex and right coronary on Monday depending upon hospital course.  This could also be done within the next 4 to 6 weeks to allow LV recovery. The Doppler echocardiogram to assess LV function.  Heart failure therapy should include ARB/beta-blocker.  Consideration to MRA and SGLT2 as needed.         Echo 05/16/2020 1. No evidence of LV thrombus, confirmed with use of echo contrast.. Left  ventricular ejection fraction, by estimation, is 35 to 40%. The left  ventricle has moderately decreased function. The left ventricle  demonstrates regional wall motion  abnormalities (see scoring diagram/findings for description). There is  mild concentric left ventricular hypertrophy. Left ventricular diastolic  parameters are indeterminate.   2. Right ventricular systolic function is low normal. The right  ventricular size is normal. Tricuspid regurgitation signal is inadequate  for assessing PA pressure.   3. The mitral valve is normal in structure. Trivial mitral valve  regurgitation. No evidence of mitral stenosis.   4. The aortic valve is grossly normal. Aortic valve regurgitation is not  visualized. No aortic stenosis is present.   5. The inferior vena cava is dilated in size with <50% respiratory  variability, suggesting right atrial pressure of 15 mmHg.        Cath 05/18/2020 Proximal to mid 85% RCA successfully treated with a 3.5 x 18 Onyx deployed at high pressure and reducing stenosis to 0%. 95% distal circumflex treated with a 2.0 x 18 mm Onyx and deployed at 12 atm x 2.  0% stenosis noted with TIMI grade III flow. Intervention was  not performed on the obtuse marginal which contained tandem 50 to 70% stenoses in the mid body and 60% ostial disease. The previously placed LAD stent during STEMI was widely patent. LVEDP 12 mmHg before intervention  performed today.   RECOMMENDATIONS:   Given normal LVEDP, will discontinue spironolactone. Resume losartan as blood pressure allows. Initiate beta-blocker therapy as heart rate and blood pressure allow. Phase 2 cardiac rehab Aggressive lipid-lowering to LDL less than 55 with assistance of SGLT2 if needed. Eligible for discharge in a.m.     _____________   History of Present Illness     Martin Mcdowell is a 69 y.o. male with a history stated above who initially presented to [facility] emergency department 05/13/2020 with substernal chest pain with radiation to his left arm with associated diaphoresis and dizziness. Unfortunately due to ED wait times, patient left AMA. Brief cardiac work-up at that time showed a stable EKG with a negative troponin at 6.   He then reports that his symptoms persisted, waxing and waning over the last several days. He states this morning, symptoms worsened acutely with anterior chest pain with left arm radiation. On EMS arrival, repeat EKG showed ST elevation in leads V2-V4 therefore STEMI was paged and patient was transferred emergently to the cardiac Cath Lab for further ischemic evaluation.   Lab work thus far shows an hsT at 76. Creatinine stable at 1.02.  Lipid panel performed which shows an LDL elevated at 168. CBC stable. D-dimer normal at 0.40.   All medications appear to be losartan 25 mg p.o. daily, Protonix 40 mg p.o. twice daily, prednisone 40 mg p.o. twice daily  Hospital Course     Consultants: N/A  When the patient returned back to the hospital, he developed the EKG changes consistent with anterior STEMI, therefore he was taken urgently to the Cath Lab for cardiac catheterization.  Cardiac catheterization performed on 05/15/2020 showed occluded mid LAD treated with PTCA and a stent placement, 85% mid OM1 lesion, 95% distal left circumflex lesion 80% proximal to mid RCA lesion.  Postprocedure, he was placed on aspirin and Brilinta.  Echocardiogram performed  on 05/16/2020 showed EF 35 to 40%, no significant valve issue.  Patient was taken back to the Cath Lab on 05/18/2020 and underwent staged PCI with stent placement to the RCA and left circumflex.    He was seen in the morning of 05/19/2020 at which time he was doing well without any chest pain or shortness of breath.  He is able to ambulate without chest pain or SOB. He is cleared for discharge from cardiac perspective.  As for his LV dysfunction, his blood pressure has been borderline low during the hospitalization, we added to low-dose metoprolol succinate 12.5 mg daily to his medical regimen.  Heart failure therapy will continue to be uptitrated as outpatient.    Did the patient have an acute coronary syndrome (MI, NSTEMI, STEMI, etc) this admission?:  Yes                               AHA/ACC Clinical Performance & Quality Measures: Aspirin prescribed? - Yes ADP Receptor Inhibitor (Plavix/Clopidogrel, Brilinta/Ticagrelor or Effient/Prasugrel) prescribed (includes medically managed patients)? - Yes Beta Blocker prescribed? - Yes High Intensity Statin (Lipitor 40-24m or Crestor 20-479m prescribed? - Yes EF assessed during THIS hospitalization? - Yes For EF <40%, was ACEI/ARB prescribed? - No - Reason:  BP low For EF <40%, Aldosterone Antagonist (Spironolactone or  Eplerenone) prescribed? - No - Reason:  BP low Cardiac Rehab Phase II ordered (including medically managed patients)? - Yes   _____________  Discharge Vitals Blood pressure 98/79, pulse 83, temperature 98.2 F (36.8 C), temperature source Oral, resp. rate 16, height '5\' 7"'  (1.702 m), weight 77.5 kg, SpO2 97 %.  Filed Weights   05/17/20 0500 05/18/20 0457 05/19/20 0528  Weight: 77.4 kg 76.8 kg 77.5 kg    Labs & Radiologic Studies    CBC Recent Labs    05/18/20 0229 05/19/20 0401  WBC 10.6* 12.2*  HGB 11.9* 11.4*  HCT 38.3* 37.0*  MCV 64.6* 64.9*  PLT 242 151   Basic Metabolic Panel Recent Labs    05/18/20 0229  05/19/20 0401  NA 139 140  K 3.8 3.7  CL 107 108  CO2 23 24  GLUCOSE 118* 94  BUN 19 17  CREATININE 1.04 1.25*  CALCIUM 8.8* 8.6*   Liver Function Tests No results for input(s): AST, ALT, ALKPHOS, BILITOT, PROT, ALBUMIN in the last 72 hours. No results for input(s): LIPASE, AMYLASE in the last 72 hours. High Sensitivity Troponin:   Recent Labs  Lab 05/13/20 2119 05/15/20 1109 05/15/20 1235 05/17/20 1209 05/17/20 1558  TROPONINIHS 6 29* 26* >27,000* >27,000*    BNP Invalid input(s): POCBNP D-Dimer No results for input(s): DDIMER in the last 72 hours. Hemoglobin A1C No results for input(s): HGBA1C in the last 72 hours. Fasting Lipid Panel No results for input(s): CHOL, HDL, LDLCALC, TRIG, CHOLHDL, LDLDIRECT in the last 72 hours. Thyroid Function Tests No results for input(s): TSH, T4TOTAL, T3FREE, THYROIDAB in the last 72 hours.  Invalid input(s): FREET3 _____________  DG Chest 2 View  Result Date: 05/13/2020 CLINICAL DATA:  Chest pain EXAM: CHEST - 2 VIEW COMPARISON:  12/10/2018 FINDINGS: Lungs are well expanded and are symmetric. Nodular density at the lung bases bilaterally likely represent nipple shadows. The lungs are otherwise clear. No pneumothorax or pleural effusion. Cardiac size within normal limits. Pulmonary vascularity normal. No acute bone abnormality. IMPRESSION: No active cardiopulmonary disease. Electronically Signed   By: Fidela Salisbury MD   On: 05/13/2020 21:28   CARDIAC CATHETERIZATION  Result Date: 05/18/2020  Proximal to mid 85% RCA successfully treated with a 3.5 x 18 Onyx deployed at high pressure and reducing stenosis to 0%.  95% distal circumflex treated with a 2.0 x 18 mm Onyx and deployed at 12 atm x 2.  0% stenosis noted with TIMI grade III flow.  Intervention was not performed on the obtuse marginal which contained tandem 50 to 70% stenoses in the mid body and 60% ostial disease.  The previously placed LAD stent during STEMI was widely  patent.  LVEDP 12 mmHg before intervention performed today. RECOMMENDATIONS:  Given normal LVEDP, will discontinue spironolactone.  Resume losartan as blood pressure allows.  Initiate beta-blocker therapy as heart rate and blood pressure allow.  Phase 2 cardiac rehab  Aggressive lipid-lowering to LDL less than 55 with assistance of SGLT2 if needed.  Eligible for discharge in a.m.  CARDIAC CATHETERIZATION  Result Date: 05/15/2020  Anterior myocardial infarction with acute severe onset of chest pain starting an hour prior to presentation at [facility].  Intermittent episodes of pain have been occurring for the previous 48 hours.  Total occlusion of the proximal to mid LAD treated with a 22 x 3.0 Onyx stent postdilated to 3.25 mm in diameter at 13 atm.  TIMI grade III flow noted.  Decreased septal blush was noted.  Widely patent left main  Large second obtuse marginal contains segmental mid body 85% stenosis.  Ostium of the second obtuse marginal contains 50 to 60% stenosis with poststenotic dilatation in the proximal segment.  Circumflex beyond the origin of the second obtuse marginal contains 95% obstruction.  A small third obtuse marginal arises beyond.  First obtuse marginal is relatively small and contains proximal 75% narrowing.  Right coronary is dominant.  PDA is diffusely diseased greater than 90%.  Proximal to mid LAD contains eccentric 85% stenosis.  Anterior wall demonstrates severe mid anterior wall to inferoapical hypokinesis/dyskinesis with EF 35%.  LVEDP 26 mmHg.  Findings are consistent with acute systolic heart failure. RECOMMENDATIONS:  Aggrastat x18 hours.  Aspirin and Brilinta times at least 12 months.  Continue ARB.  Add beta-blocker therapy as tolerated by hemodynamics.  Relative bradycardia and low blood pressure currently prevents addition of beta-blocker therapy.  Consider staged multisite intervention on the circumflex and right coronary on Monday depending upon  hospital course.  This could also be done within the next 4 to 6 weeks to allow LV recovery.  The Doppler echocardiogram to assess LV function.  Heart failure therapy should include ARB/beta-blocker.  Consideration to MRA and SGLT2 as needed.  ECHOCARDIOGRAM COMPLETE  Result Date: 05/16/2020    ECHOCARDIOGRAM REPORT   Patient Name:   Martin Mcdowell Date of Exam: 05/16/2020 Medical Rec #:  [id number]   Height:       67.0 in Accession #:    [id number]  Weight:       170.2 lb Date of Birth:  July 17, 1951  BSA:          1.888 m Patient Age:    58 years    BP:           114/88 mmHg Patient Gender: M           HR:           79 bpm. Exam Location:  Inpatient Procedure: 2D Echo, Cardiac Doppler, Color Doppler and Intracardiac            Opacification Agent Indications:    CHF-Acute Systolic  History:        Patient has no prior history of Echocardiogram examinations.                 Acute MI; Risk Factors:Former Smoker.  Sonographer:    Clayton Lefort RDCS (AE) Referring Phys: [place]  1. No evidence of LV thrombus, confirmed with use of echo contrast.. Left ventricular ejection fraction, by estimation, is 35 to 40%. The left ventricle has moderately decreased function. The left ventricle demonstrates regional wall motion abnormalities (see scoring diagram/findings for description). There is mild concentric left ventricular hypertrophy. Left ventricular diastolic parameters are indeterminate.  2. Right ventricular systolic function is low normal. The right ventricular size is normal. Tricuspid regurgitation signal is inadequate for assessing PA pressure.  3. The mitral valve is normal in structure. Trivial mitral valve regurgitation. No evidence of mitral stenosis.  4. The aortic valve is grossly normal. Aortic valve regurgitation is not visualized. No aortic stenosis is present.  5. The inferior vena cava is dilated in size with <50% respiratory variability, suggesting right atrial pressure of 15 mmHg.  Comparison(s): No prior Echocardiogram. Conclusion(s)/Recommendation(s): Reduced LV EF with focal wall motion abnormalities in the mid to distal LV, most prominent in mid to distal anterior and anteroseptal walls. Hypokinesis of distal inferior and lateral wall also noted. FINDINGS  Left Ventricle: No evidence of LV thrombus, confirmed with use of echo contrast. Left ventricular ejection fraction, by estimation, is 35 to 40%. The left ventricle has moderately decreased function. The left ventricle demonstrates regional wall motion abnormalities. Definity contrast agent was given IV to delineate the left ventricular endocardial borders. The left ventricular internal cavity size was normal in size. There is mild concentric left ventricular hypertrophy. Left ventricular diastolic parameters are indeterminate.  LV Wall Scoring: The mid and distal anterior septum and apex are akinetic. The mid and distal anterior wall, mid and distal inferior wall, apical lateral segment, and mid inferoseptal segment are hypokinetic. The antero-lateral wall, posterior wall, basal anteroseptal segment, basal anterior segment, basal inferior segment, and basal inferoseptal segment are normal. Right Ventricle: The right ventricular size is normal. No increase in right ventricular wall thickness. Right ventricular systolic function is low normal. Tricuspid regurgitation signal is inadequate for assessing PA pressure. Left Atrium: Left atrial size was normal in size. Right Atrium: Right atrial size was normal in size. Pericardium: A small pericardial effusion is present. Mitral Valve: The mitral valve is normal in structure. Trivial mitral valve regurgitation. No evidence of mitral valve stenosis. MV peak gradient, 3.4 mmHg. The mean mitral valve gradient is 1.0 mmHg. Tricuspid Valve: The tricuspid valve is normal in structure. Tricuspid valve regurgitation is trivial. No evidence of tricuspid stenosis. Aortic Valve: The aortic valve is  grossly normal. Aortic valve regurgitation is not visualized. Aortic regurgitation PHT measures 1370 msec. No aortic stenosis is present. Aortic valve mean gradient measures 4.0 mmHg. Aortic valve peak gradient measures 7.0 mmHg. Aortic valve area, by VTI measures 2.10 cm. Pulmonic Valve: The pulmonic valve was not well visualized. Pulmonic valve regurgitation is not visualized. Aorta: The aortic root, ascending aorta and aortic arch are all structurally normal, with no evidence of dilitation or obstruction. Venous: The inferior vena cava is dilated in size with less than 50% respiratory variability, suggesting right atrial pressure of 15 mmHg. IAS/Shunts: No atrial level shunt detected by color flow Doppler.  LEFT VENTRICLE PLAX 2D LVIDd:         4.10 cm  Diastology LVIDs:         2.70 cm  LV e' lateral:   6.09 cm/s LV PW:         1.20 cm  LV E/e' lateral: 7.5 LV IVS:        1.30 cm  LV e' medial:    5.22 cm/s LVOT diam:     1.80 cm  LV E/e' medial:  8.7 LV SV:         46 LV SV Index:   25 LVOT Area:     2.54 cm  RIGHT VENTRICLE            IVC RV S prime:     8.43 cm/s  IVC diam: 2.10 cm TAPSE (M-mode): 1.6 cm LEFT ATRIUM           Index       RIGHT ATRIUM           Index LA diam:      2.50 cm 1.32 cm/m  RA Area:     11.40 cm LA Vol (A2C): 41.2 ml 21.82 ml/m RA Volume:   28.00 ml  14.83 ml/m LA Vol (A4C): 42.8 ml 22.67 ml/m  AORTIC VALVE AV Area (Vmax):    1.97 cm AV Area (Vmean):   1.83 cm AV Area (VTI):     2.10 cm AV Vmax:  132.00 cm/s AV Vmean:          94.300 cm/s AV VTI:            0.221 m AV Peak Grad:      7.0 mmHg AV Mean Grad:      4.0 mmHg LVOT Vmax:         102.00 cm/s LVOT Vmean:        67.800 cm/s LVOT VTI:          0.182 m LVOT/AV VTI ratio: 0.82 AI PHT:            1370 msec  AORTA Ao Root diam: 3.40 cm Ao Asc diam:  3.40 cm MITRAL VALVE MV Area (PHT): 1.74 cm    SHUNTS MV Peak grad:  3.4 mmHg    Systemic VTI:  0.18 m MV Mean grad:  1.0 mmHg    Systemic Diam: 1.80 cm MV Vmax:        0.92 m/s MV Vmean:      45.4 cm/s MV Decel Time: 437 msec MV E velocity: 45.40 cm/s MV A velocity: 73.70 cm/s MV E/A ratio:  0.62 Buford Dresser MD Electronically signed by Buford Dresser MD Signature Date/Time: 05/16/2020/2:40:41 PM    Final    Disposition   Pt is being discharged home today in good condition.  Follow-up Plans & Appointments     Follow-up Information     Belva Crome, MD Follow up on 06/01/2020.   Specialty: Cardiology Why: 2:00PM. Please arrive 15 min early.  Contact information: [address] [place] [address] [phone]                Discharge Instructions     AMB Referral to Cardiac Rehabilitation - Phase II   Complete by: As directed    Diagnosis: STEMI   After initial evaluation and assessments completed: Virtual Based Care may be provided alone or in conjunction with Phase 2 Cardiac Rehab based on patient barriers.: Yes   Amb Referral to Cardiac Rehabilitation   Complete by: As directed    Diagnosis:  STEMI Coronary Stents     After initial evaluation and assessments completed: Virtual Based Care may be provided alone or in conjunction with Phase 2 Cardiac Rehab based on patient barriers.: Yes   Diet - low sodium heart healthy   Complete by: As directed    Discharge instructions   Complete by: As directed    No driving for 48 hours. No lifting over 5 lbs for 1 week. No sexual activity for 1 week. You may return to work in 2 weeks once cleared by cardiology service. Keep procedure site clean & dry. If you notice increased pain, swelling, bleeding or pus, call/return!  You may shower, but no soaking baths/hot tubs/pools for 1 week.   Increase activity slowly   Complete by: As directed        Discharge Medications   Allergies as of 05/19/2020       Reactions   Sulfamethoxazole-trimethoprim Anaphylaxis, Rash   DRESS syndrome requiring hospitalization; 12/11/2018        Medication List     STOP  taking these medications    losartan 25 MG tablet Commonly known as: COZAAR       TAKE these medications    aspirin 81 MG EC tablet Take 1 tablet (81 mg total) by mouth daily. Swallow whole. Start taking on: May 20, 2020   atorvastatin 80 MG tablet Commonly known as: LIPITOR  Take 1 tablet (80 mg total) by mouth daily. Start taking on: May 20, 2020   azithromycin 250 MG tablet Commonly known as: ZITHROMAX Take 250 mg by mouth daily.   metoprolol succinate 25 MG 24 hr tablet Commonly known as: TOPROL-XL Take 0.5 tablets (12.5 mg total) by mouth daily. Start taking on: May 20, 2020   nitroGLYCERIN 0.4 MG SL tablet Commonly known as: NITROSTAT Place 1 tablet (0.4 mg total) under the tongue every 5 (five) minutes x 3 doses as needed for chest pain.   pantoprazole 40 MG tablet Commonly known as: PROTONIX Take 1 tablet (40 mg total) by mouth 2 (two) times daily.   predniSONE 20 MG tablet Commonly known as: DELTASONE Take 20 mg by mouth daily.   ticagrelor 90 MG Tabs tablet Commonly known as: BRILINTA Take 1 tablet (90 mg total) by mouth 2 (two) times daily.           Outstanding Labs/Studies   FLP and LFT in 6-8 weeks (to be ordered as outpatient)  Duration of Discharge Encounter   Greater than 30 minutes including physician time.  Hilbert Corrigan, PA 05/19/2020, 12:43 PM   Agree with note by Almyra Deforest PA-C  Mr. Mulrooney  was admitted with a delayed presentation anterior STEMI and was taken to the Cath Lab by Dr. Tamala Julian to perform PCI drug-eluting stenting. He had residual disease in the mid RCA and circumflex which were fixed in a staged fashion. His EF was 35 to 40%. He is on optimal medical therapy is asymptomatic with a normal exam. He is stable for discharge home today, follow-up with Dr. Tamala Julian as an outpatient.  Lorretta Harp, M.D., [place], [facility], [place] [address] [place], [place]   [id number]  [phone] 05/19/2020 12:52 PM

## 2020-05-20 ENCOUNTER — Telehealth (HOSPITAL_COMMUNITY): Payer: Self-pay

## 2020-05-20 MED FILL — Tirofiban HCl in NaCl 0.9% IV Soln 5 MG/100ML (Base Equiv): INTRAVENOUS | Qty: 100 | Status: AC

## 2020-05-20 NOTE — Telephone Encounter (Signed)
Attempted to call patient in regards to Cardiac Rehab - LM on VM 

## 2020-05-20 NOTE — Telephone Encounter (Signed)
**Note De-Identified  Obfuscation** Patient contacted regarding discharge from [facility] on 05/19/2020.  Patient understands to follow up with Dr. Katrinka Blazing on 06/01/2020 at 2:00 at [address], [address] in [place], [place] [address]. Patient understands discharge instructions? Yes Patient understands medications and regiment? Yes Patient understands to bring all medications to this visit? Yes  Ask patient:  Are you enrolled in My Chart: Yes

## 2020-05-20 NOTE — Telephone Encounter (Signed)
Pt insurance is active and benefits verified through Medicare. Co-pay $0.00, DED $203.00/$203.00 met, out of pocket $0.00/$0.00 met, co-insurance 20%. No pre-authorization required. Passport, 05/20/20 @ 10:01AM, VTV#[id number]  2ndary insurance is active and benefits verified through [employer]. Co-pay $0.00, DED $0.00/$0.00 met, out of pocket $0.00/$0.00 met, co-insurance 0%. No pre-authorization required.   Will contact patient to see if he is interested in the Cardiac Rehab Program. If interested, patient will need to complete follow up appt. Once completed, patient will be contacted for scheduling upon review by the RN Navigator.

## 2020-05-31 ENCOUNTER — Encounter (HOSPITAL_COMMUNITY): Payer: Self-pay | Admitting: Interventional Cardiology

## 2020-05-31 NOTE — Progress Notes (Signed)
Cardiology Office Note:    Date:  06/01/2020   ID:  Martin Mcdowell, DOB 1951-05-12, MRN [id number]  PCP:  Rocco Serene, MD  Cardiologist:  Sinclair Grooms, MD   Referring MD: No ref. provider found   Chief Complaint  Patient presents with  . Coronary Artery Disease    History of Present Illness:    Martin Mcdowell is a 69 y.o. male with a hx of anterior MI 1/32/4401, acute systolic heart failure EF 35 to 40%, hypertension, DRESS syndrome, and hyperlipidemia presents for f/u post MI.  Here today with his wife accompanying him.  He denies symptoms.  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  The job does not include heavy physical activity.  Relative to his heart, he denies orthopnea, PND, palpitations, syncope, edema, and recurrent chest pain.  We spent significant time discussing his heart attack, how the absence of EKG changes 2 days before his presentation with acute MI could have occurred.  We discussed a multi site stenting procedure that was performed but with the LAD being the acute lesions.  Significant time was spent discussing optimal management for systolic heart failure which was not acute complication of his myocardial infarction.  We also discussed in detail secondary prevention.  He is against cardiac rehab but I have strongly encouraged that he consider several weeks of rehab to improve his knowledge base and to help [place] track his progress given the size of his myocardial infarction.  Past Medical History:  Diagnosis Date  . Anemia   . DRESS syndrome   . GERD (gastroesophageal reflux disease)   . Hiatal hernia   . Hypertension   . 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 Toxoplasmosis     Past Surgical History:  Procedure Laterality Date  . CORONARY STENT INTERVENTION N/A 05/18/2020   Procedure: CORONARY STENT INTERVENTION;  Surgeon: Belva Crome, MD;  Location: [place] [facility];  Service: Cardiovascular;   Laterality: N/A;  . CORONARY/GRAFT ACUTE MI REVASCULARIZATION N/A 05/15/2020   Procedure: CORONARY/GRAFT ACUTE MI REVASCULARIZATION;  Surgeon: Belva Crome, MD;  Location: [place] [facility];  Service: Cardiovascular;  Laterality: N/A;  . NO PAST SURGERIES      Current Medications: Current Meds  Medication Sig  . aspirin EC 81 MG EC tablet Take 1 tablet (81 mg total) by mouth daily. Swallow whole.  Marland Kitchen atorvastatin (LIPITOR) 80 MG tablet Take 1 tablet (80 mg total) by mouth daily.  . metoprolol succinate (TOPROL-XL) 25 MG 24 hr tablet Take 0.5 tablets (12.5 mg total) by mouth daily.  . nitroGLYCERIN (NITROSTAT) 0.4 MG SL tablet Place 1 tablet (0.4 mg total) under the tongue every 5 (five) minutes x 3 doses as needed for chest pain.  . pantoprazole (PROTONIX) 40 MG tablet Take 40 mg by mouth daily.  . predniSONE (DELTASONE) 20 MG tablet Take 20 mg by mouth daily.   . ticagrelor (BRILINTA) 90 MG TABS tablet Take 1 tablet (90 mg total) by mouth 2 (two) times daily.     Allergies:   Sulfamethoxazole-trimethoprim   Social History   Socioeconomic History  . Marital status: Married    Spouse name: Not on file  . Number of children: Not on file  . Years of education: Not on file  . Highest education level: Not on file  Occupational History  . Not on file  Tobacco Use  . Smoking status:  Former Smoker    Types: Cigarettes  . Smokeless tobacco: Never Used  Vaping Use  . Vaping Use: Never used  Substance and Sexual Activity  . Alcohol use: Yes    Alcohol/week: 2.0 - 3.0 standard drinks    Types: 2 - 3 Standard drinks or equivalent per week    Comment: Occ   . Drug use: No  . Sexual activity: Not on file  Other Topics Concern  . Not on file  Social History Narrative  . Not on file   Social Determinants of Health   Financial Resource Strain:   . Difficulty of Paying Living Expenses:   Food Insecurity:   . Worried About Charity fundraiser in the Last Year:   . Arboriculturist  in the Last Year:   Transportation Needs:   . Film/video editor (Medical):   Marland Kitchen Lack of Transportation (Non-Medical):   Physical Activity:   . Days of Exercise per Week:   . Minutes of Exercise per Session:   Stress:   . Feeling of Stress :   Social Connections:   . Frequency of Communication with Friends and Family:   . Frequency of Social Gatherings with Friends and Family:   . Attends Religious Services:   . Active Member of Clubs or Organizations:   . Attends Archivist Meetings:   Marland Kitchen Marital Status:      Family History: The patient's family history is negative for Colon cancer, Stomach cancer, Pancreatic cancer, Esophageal cancer, and Rectal cancer.  ROS:   Please see the history of present illness.    He is he is taking the medications as listed.  He is tolerating the medications well.  He had hypotension while hospitalized.  All other systems reviewed and are negative.  EKGs/Labs/Other Studies Reviewed:    The following studies were reviewed today:  [place] 2021: IMPRESSIONS    1. No evidence of LV thrombus, confirmed with use of echo contrast.. Left  ventricular ejection fraction, by estimation, is 35 to 40%. The left  ventricle has moderately decreased function. The left ventricle  demonstrates regional wall motion  abnormalities (see scoring diagram/findings for description). There is  mild concentric left ventricular hypertrophy. Left ventricular diastolic  parameters are indeterminate.  2. Right ventricular systolic function is low normal. The right  ventricular size is normal. Tricuspid regurgitation signal is inadequate  for assessing PA pressure.  3. The mitral valve is normal in structure. Trivial mitral valve  regurgitation. No evidence of mitral stenosis.  4. The aortic valve is grossly normal. Aortic valve regurgitation is not  visualized. No aortic stenosis is present.  5. The inferior vena cava is dilated in size with <50%  respiratory  variability, suggesting right atrial pressure of 15 mmHg.   STAGED PCI 05/18/2020: Diagnostic Dominance: Right  Intervention     EKG:  EKG not repeated  Recent Labs: 05/15/2020: ALT 50 05/19/2020: BUN 17; Creatinine, Ser 1.25; Hemoglobin 11.4; Platelets 246; Potassium 3.7; Sodium 140  Recent Lipid Panel    Component Value Date/Time   CHOL 242 (H) 05/15/2020 1109   TRIG 146 05/15/2020 1109   HDL 45 05/15/2020 1109   CHOLHDL 5.4 05/15/2020 1109   VLDL 29 05/15/2020 1109   LDLCALC 168 (H) 05/15/2020 1109    Physical Exam:    VS:  BP (!) 92/58   Pulse 75   Ht '5\' 7"'  (1.702 m)   Wt 176 lb 9.6 oz (80.1 kg)   SpO2  98%   BMI 27.66 kg/m     Wt Readings from Last 3 Encounters:  06/01/20 176 lb 9.6 oz (80.1 kg)  05/19/20 170 lb 14.4 oz (77.5 kg)  05/13/20 170 lb (77.1 kg)     GEN: Compatible with age in appearance. No acute distress HEENT: Normal NECK: No JVD. LYMPHATICS: No lymphadenopathy CARDIAC: An S4 gallop is present along with RRR without murmur, S3 gallop, or edema. VASCULAR:  Normal Pulses. No bruits. RESPIRATORY:  Clear to auscultation without rales, wheezing or rhonchi  ABDOMEN: Soft, non-tender, non-distended, No pulsatile mass, MUSCULOSKELETAL: No deformity  SKIN: Warm and dry NEUROLOGIC:  Alert and oriented x 3 PSYCHIATRIC:  Normal affect   ASSESSMENT:    1. Acute ST elevation myocardial infarction (STEMI) involving left anterior descending (LAD) coronary artery ([place])   2. Acute systolic heart failure ([place])   3. Hyperlipidemia LDL goal <70   4. Educated about COVID-19 virus infection    PLAN:    In order of problems listed above:  1. Resolved with patient receiving acute intervention on LAD with stenting.  Subsequently has stenting of the distal circumflex and proximal RCA.  He is on Brilinta and aspirin therapy for at least 1 year.  We discussed secondary prevention including lipid management, aerobic activity, potential screening for  sleep apnea, and at least 6 hours sleep per night.  Phase 2 cardiac rehab also recommended. 2. Start losartan 25 mg/day and continue Toprol-XL 12.5 mg/day.  Uptitrate therapy as tolerated.  Return for follow-up evaluation in 3 to 4 weeks.  Be met should be done at that time. 3. Continue high intensity statin therapy in the form of Lipitor 80 mg/day.  Lipid panel in 1 month. 4. COVID-19 vaccine is been received.  Overall education and awareness concerning primary/secondary risk prevention was discussed in detail: LDL less than 70, hemoglobin A1c less than 7, blood pressure target less than 130/80 mmHg, >150 minutes of moderate aerobic activity per week, avoidance of smoking, weight control (via diet and exercise), and continued surveillance/management of/for obstructive sleep apnea.    Medication Adjustments/Labs and Tests Ordered: Current medicines are reviewed at length with the patient today.  Concerns regarding medicines are outlined above.  Orders Placed This Encounter  Procedures  . Lipid panel  . Hepatic function panel  . AMB referral to cardiac rehabilitation   Meds ordered this encounter  Medications  . losartan (COZAAR) 25 MG tablet    Sig: Take 1 tablet (25 mg total) by mouth daily.    Dispense:  90 tablet    Refill:  3    Patient Instructions  Medication Instructions:  1) RESTART Losartan 91m once daily  *If you need a refill on your cardiac medications before your next appointment, please call your pharmacy*   Lab Work: Lipid and Liver at the beginning of September.  You will need to be fasting for these labs (nothing to eat or drink after midnight except water and black coffee).  If you have labs (blood work) drawn today and your tests are completely normal, you will receive your results only by: .Marland KitchenMyChart Message (if you have MyChart) OR . A paper copy in the mail If you have any lab test that is abnormal or we need to change your treatment, we will call you to  review the results.   Testing/Procedures: None   Follow-Up: [facility] and your health needs are our priority.  As part of our continuing mission to provide you with  exceptional heart care, we have created designated Provider Care Teams.  These Care Teams include your primary Cardiologist (physician) and Advanced Practice Providers (APPs -  Physician Assistants and Nurse Practitioners) who all work together to provide you with the care you need, when you need it.  We recommend signing up for the patient portal called "MyChart".  Sign up information is provided on this After Visit Summary.  MyChart is used to connect with patients for Virtual Visits (Telemedicine).  Patients are able to view lab/test results, encounter notes, upcoming appointments, etc.  Non-urgent messages can be sent to your provider as well.   To learn more about what you can do with MyChart, go to [email or web address].    Your next appointment:   1 month(s)  The format for your next appointment:   In Person  Provider:   You may see Sinclair Grooms, MD or one of the following Advanced Practice Providers on your designated Care Team:    Truitt Merle, NP  Cecilie Kicks, NP  Kathyrn Drown, NP    Other Instructions  You have been referred to Cardiac Rehab    Signed, Sinclair Grooms, MD  06/01/2020 6:57 PM    [place]

## 2020-06-01 ENCOUNTER — Other Ambulatory Visit: Payer: Self-pay

## 2020-06-01 ENCOUNTER — Ambulatory Visit (INDEPENDENT_AMBULATORY_CARE_PROVIDER_SITE_OTHER): Payer: Medicare Other | Admitting: Interventional Cardiology

## 2020-06-01 ENCOUNTER — Encounter: Payer: Self-pay | Admitting: Interventional Cardiology

## 2020-06-01 VITALS — BP 92/58 | HR 75 | Ht 67.0 in | Wt 176.6 lb

## 2020-06-01 DIAGNOSIS — E785 Hyperlipidemia, unspecified: Secondary | ICD-10-CM | POA: Diagnosis not present

## 2020-06-01 DIAGNOSIS — Z7189 Other specified counseling: Secondary | ICD-10-CM

## 2020-06-01 DIAGNOSIS — I5021 Acute systolic (congestive) heart failure: Secondary | ICD-10-CM

## 2020-06-01 DIAGNOSIS — I2102 ST elevation (STEMI) myocardial infarction involving left anterior descending coronary artery: Secondary | ICD-10-CM | POA: Diagnosis not present

## 2020-06-01 MED ORDER — LOSARTAN POTASSIUM 25 MG PO TABS
25.0000 mg | ORAL_TABLET | Freq: Every day | ORAL | 3 refills | Status: DC
Start: 2020-06-01 — End: 2020-08-03

## 2020-06-01 NOTE — Patient Instructions (Addendum)
Medication Instructions:  1) RESTART Losartan 25mg  once daily  *If you need a refill on your cardiac medications before your next appointment, please call your pharmacy*   Lab Work: Lipid and Liver at the beginning of September.  You will need to be fasting for these labs (nothing to eat or drink after midnight except water and black coffee).  If you have labs (blood work) drawn today and your tests are completely normal, you will receive your results only by: October MyChart Message (if you have MyChart) OR . A paper copy in the mail If you have any lab test that is abnormal or we need to change your treatment, we will call you to review the results.   Testing/Procedures: None   Follow-Up: At [facility], you and your health needs are our priority.  As part of our continuing mission to provide you with exceptional heart care, we have created designated Provider Care Teams.  These Care Teams include your primary Cardiologist (physician) and Advanced Practice Providers (APPs -  Physician Assistants and Nurse Practitioners) who all work together to provide you with the care you need, when you need it.  We recommend signing up for the patient portal called "MyChart".  Sign up information is provided on this After Visit Summary.  MyChart is used to connect with patients for Virtual Visits (Telemedicine).  Patients are able to view lab/test results, encounter notes, upcoming appointments, etc.  Non-urgent messages can be sent to your provider as well.   To learn more about what you can do with MyChart, go to [facility].    Your next appointment:   1 month(s)  The format for your next appointment:   In Person  Provider:   You may see [email or web address], MD or one of the following Advanced Practice Providers on your designated Care Team:    Lesleigh Noe, NP  Norma Fredrickson, NP  Nada Boozer, NP    Other Instructions  You have been referred to Cardiac Rehab

## 2020-06-04 ENCOUNTER — Telehealth (HOSPITAL_COMMUNITY): Payer: Self-pay

## 2020-06-04 DIAGNOSIS — H43811 Vitreous degeneration, right eye: Secondary | ICD-10-CM | POA: Diagnosis not present

## 2020-06-04 DIAGNOSIS — B5801 Toxoplasma chorioretinitis: Secondary | ICD-10-CM | POA: Diagnosis not present

## 2020-06-04 DIAGNOSIS — H35362 Drusen (degenerative) of macula, left eye: Secondary | ICD-10-CM | POA: Diagnosis not present

## 2020-06-04 DIAGNOSIS — H35371 Puckering of macula, right eye: Secondary | ICD-10-CM | POA: Diagnosis not present

## 2020-06-04 NOTE — Telephone Encounter (Signed)
Called patient to see if he is interested in the Cardiac Rehab Program. Patient stated not at this time due to his work schedule. ° °Closed referral °

## 2020-06-04 NOTE — Telephone Encounter (Signed)
Pt insurance is active and benefits verified through Medicare A/B. Co-pay $0.00, DED $203.00/$203.00 met, out of pocket $0.00/$0.00 met, co-insurance 20%. No pre-authorization required. Passport, 06/04/20 @ 12:21PM, JNG#[id number]  2ndary insurance is active and benefits verified through [employer]. Co-pay $0.00, DED $0.00/$0.00 met, out of pocket $0.00/$0.00 met, co-insurance 0%. No pre-authorization required.   Will contact patient to see if he is interested in the Cardiac Rehab Program.

## 2020-06-10 DIAGNOSIS — Z23 Encounter for immunization: Secondary | ICD-10-CM | POA: Diagnosis not present

## 2020-06-21 NOTE — Progress Notes (Signed)
Cardiology Office Note:    Date:  06/26/2020   ID:  Martin JulianGerald Mcdowell, DOB [birth year], MRN [id number]  PCP:  Virl CageyLamb, Andrew S, MD  Cardiologist:  Lesleigh NoeHenry W Demarie Hyneman III, MD   Referring MD: Virl CageyLamb, Andrew S, MD   Chief Complaint  Patient presents with  . Coronary Artery Disease  . Congestive Heart Failure    History of Present Illness:    Martin Mcdowell is a 69 y.o. male with a hx of a hx of anterior MI 05/15/2020, acute systolic heart failure EF 35 to 40%, hypertension, DRESS syndrome, and hyperlipidemia presents for f/u post MI.  General is not able to participate in phase 2 cardiac rehab.  He has been walking on his own.  He and his wife walk 2 miles yesterday.  He has intermittent mild dyspnea.  He also has some mild episodes of smothering feeling when he initially lays down.  He is not having bilateral lower extremity swelling but has had chronic left ankle edema.  He denies angina.  He has not needed nitroglycerin.  No palpitations or arrhythmia.  Past Medical History:  Diagnosis Date  . Anemia   . DRESS syndrome   . GERD (gastroesophageal reflux disease)   . Hiatal hernia   . Hypertension   . 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 Toxoplasmosis     Past Surgical History:  Procedure Laterality Date  . CORONARY STENT INTERVENTION N/A 05/18/2020   Procedure: CORONARY STENT INTERVENTION;  Surgeon: Lyn RecordsSmith, Riku Buttery W, MD;  [facility];  Service: Cardiovascular;  Laterality: N/A;  . CORONARY/GRAFT ACUTE MI REVASCULARIZATION N/A 05/15/2020   Procedure: CORONARY/GRAFT ACUTE MI REVASCULARIZATION;  Surgeon: Lyn RecordsSmith, Francisca Langenderfer W, MD;  Location: [facility];  Service: Cardiovascular;  Laterality: N/A;  . NO PAST SURGERIES      Current Medications: Current Meds  Medication Sig  . aspirin EC 81 MG EC tablet Take 1 tablet (81 mg total) by mouth daily. Swallow whole.  Marland Kitchen. atorvastatin (LIPITOR) 80 MG tablet Take 1 tablet (80 mg total) by mouth daily.  Marland Kitchen.  losartan (COZAAR) 25 MG tablet Take 1 tablet (25 mg total) by mouth daily.  . metoprolol succinate (TOPROL-XL) 25 MG 24 hr tablet Take 0.5 tablets (12.5 mg total) by mouth daily.  . nitroGLYCERIN (NITROSTAT) 0.4 MG SL tablet Place 1 tablet (0.4 mg total) under the tongue every 5 (five) minutes x 3 doses as needed for chest pain.  . pantoprazole (PROTONIX) 40 MG tablet Take 40 mg by mouth daily.  . predniSONE (DELTASONE) 20 MG tablet Take 20 mg by mouth daily.   . ticagrelor (BRILINTA) 90 MG TABS tablet Take 1 tablet (90 mg total) by mouth 2 (two) times daily.     Allergies:   Sulfamethoxazole-trimethoprim   Social History   Socioeconomic History  . Marital status: Married    Spouse name: Not on file  . Number of children: Not on file  . Years of education: Not on file  . Highest education level: Not on file  Occupational History  . Not on file  Tobacco Use  . Smoking status: Former Smoker    Types: Cigarettes  . Smokeless tobacco: Never Used  Vaping Use  . Vaping Use: Never used  Substance and Sexual Activity  . Alcohol use: Yes    Alcohol/week: 2.0 - 3.0 standard drinks    Types: 2 - 3 Standard drinks or equivalent per week  Comment: Occ   . Drug use: No  . Sexual activity: Not on file  Other Topics Concern  . Not on file  Social History Narrative  . Not on file   Social Determinants of Health   Financial Resource Strain:   . Difficulty of Paying Living Expenses: Not on file  Food Insecurity:   . Worried About Programme researcher, broadcasting/film/video in the Last Year: Not on file  . Ran Out of Food in the Last Year: Not on file  Transportation Needs:   . Lack of Transportation (Medical): Not on file  . Lack of Transportation (Non-Medical): Not on file  Physical Activity:   . Days of Exercise per Week: Not on file  . Minutes of Exercise per Session: Not on file  Stress:   . Feeling of Stress : Not on file  Social Connections:   . Frequency of Communication with Friends and Family:  Not on file  . Frequency of Social Gatherings with Friends and Family: Not on file  . Attends Religious Services: Not on file  . Active Member of Clubs or Organizations: Not on file  . Attends Banker Meetings: Not on file  . Marital Status: Not on file     Family History: The patient's family history is negative for Colon cancer, Stomach cancer, Pancreatic cancer, Esophageal cancer, and Rectal cancer.  ROS:   Please see the history of present illness.    No new complaints.  He wants to return to work.  He works as an Conservation officer, nature driving.  He also complains that for several years now he intermittently has shivering and fevers.  He wanted to make sure that I did not feel this had a cardiac implication.  All other systems reviewed and are negative.  EKGs/Labs/Other Studies Reviewed:    The following studies were reviewed today: No new data.  Recent lipid panel revealed an LDL 75.  Her tests were okay.  EKG:  EKG not repeated.  Recent Labs: 05/19/2020: BUN 17; Creatinine, Ser 1.25; Hemoglobin 11.4; Platelets 246; Potassium 3.7; Sodium 140 06/25/2020: ALT 24  Recent Lipid Panel    Component Value Date/Time   CHOL 139 06/25/2020 0945   TRIG 102 06/25/2020 0945   HDL 45 06/25/2020 0945   CHOLHDL 3.1 06/25/2020 0945   CHOLHDL 5.4 05/15/2020 1109   VLDL 29 05/15/2020 1109   LDLCALC 75 06/25/2020 0945    Physical Exam:    VS:  BP 94/60   Pulse 72   Ht 5\' 7"  (1.702 m)   Wt 175 lb 3.2 oz (79.5 kg)   SpO2 98%   BMI 27.44 kg/m     Wt Readings from Last 3 Encounters:  06/26/20 175 lb 3.2 oz (79.5 kg)  06/01/20 176 lb 9.6 oz (80.1 kg)  05/19/20 170 lb 14.4 oz (77.5 kg)     GEN: Healthy-appearing. No acute distress HEENT: Normal NECK: No JVD. LYMPHATICS: No lymphadenopathy CARDIAC:  RRR without murmur, gallop, or edema. VASCULAR:  Normal Pulses. No bruits. RESPIRATORY:  Clear to auscultation without rales, wheezing or rhonchi  ABDOMEN: Soft,  non-tender, non-distended, No pulsatile mass, MUSCULOSKELETAL: No deformity  SKIN: Warm and dry NEUROLOGIC:  Alert and oriented x 3 PSYCHIATRIC:  Normal affect   ASSESSMENT:    1. Coronary artery disease of native artery of native heart with stable angina pectoris (HCC)   2. Old MI (myocardial infarction)   3. Acute systolic heart failure (HCC)   4. Hyperlipidemia LDL goal <  70   5. Educated about COVID-19 virus infection    PLAN:    In order of problems listed above:  1. Secondary prevention discussed.  Unfortunately, no cardiac rehab.  Continue medications as listed. 2. No recurrence of chest discomfort. 3. Low EF post MI.  Low-dose metoprolol and losartan.  Unable to uptitrate because of lowish blood pressures.  If orthopnea/dyspnea continues, we will need to squeeze and low-dose spironolactone.  We will plan to perform a 2D echo cardiogram prior to the next office visit in 3 months. 4. Continue high intensity statin therapy.  LDL and total cholesterol now at target 5. COVID-19 vaccinated.  Practicing medication.  Overall education and awareness concerning primary/secondary risk prevention was discussed in detail: LDL less than 70, hemoglobin A1c less than 7, blood pressure target less than 130/80 mmHg, >150 minutes of moderate aerobic activity per week, avoidance of smoking, weight control (via diet and exercise), and continued surveillance/management of/for obstructive sleep apnea.    Medication Adjustments/Labs and Tests Ordered: Current medicines are reviewed at length with the patient today.  Concerns regarding medicines are outlined above.  Orders Placed This Encounter  Procedures  . ECHOCARDIOGRAM COMPLETE   No orders of the defined types were placed in this encounter.   Patient Instructions  Medication Instructions:  Your physician recommends that you continue on your current medications as directed. Please refer to the Current Medication list given to you  today.  *If you need a refill on your cardiac medications before your next appointment, please call your pharmacy*   Lab Work: None If you have labs (blood work) drawn today and your tests are completely normal, you will receive your results only by: Marland Kitchen MyChart Message (if you have MyChart) OR . A paper copy in the mail If you have any lab test that is abnormal or we need to change your treatment, we will call you to review the results.   Testing/Procedures: Your physician has requested that you have an echocardiogram a few days prior to seeing Dr. Katrinka Blazing back in 3 months. Echocardiography is a painless test that uses sound waves to create images of your heart. It provides your doctor with information about the size and shape of your heart and how well your heart's chambers and valves are working. This procedure takes approximately one hour. There are no restrictions for this procedure.     Follow-Up: At [facility], you and your health needs are our priority.  As part of our continuing mission to provide you with exceptional heart care, we have created designated Provider Care Teams.  These Care Teams include your primary Cardiologist (physician) and Advanced Practice Providers (APPs -  Physician Assistants and Nurse Practitioners) who all work together to provide you with the care you need, when you need it.  We recommend signing up for the patient portal called "MyChart".  Sign up information is provided on this After Visit Summary.  MyChart is used to connect with patients for Virtual Visits (Telemedicine).  Patients are able to view lab/test results, encounter notes, upcoming appointments, etc.  Non-urgent messages can be sent to your provider as well.   To learn more about what you can do with MyChart, go to [email or web address].    Your next appointment:   3 month(s)  The format for your next appointment:   In Person  Provider:   You may see Lesleigh Noe, MD or one of  the following Advanced Practice Providers on your designated Care Team:  Norma Fredrickson, NP  Nada Boozer, NP  Georgie Chard, NP    Other Instructions      Signed, Lesleigh Noe, MD  06/26/2020 11:19 AM    [facility]

## 2020-06-25 ENCOUNTER — Other Ambulatory Visit: Payer: Self-pay

## 2020-06-25 ENCOUNTER — Other Ambulatory Visit: Payer: Medicare Other

## 2020-06-25 DIAGNOSIS — E785 Hyperlipidemia, unspecified: Secondary | ICD-10-CM | POA: Diagnosis not present

## 2020-06-25 DIAGNOSIS — I2102 ST elevation (STEMI) myocardial infarction involving left anterior descending coronary artery: Secondary | ICD-10-CM

## 2020-06-25 LAB — LIPID PANEL
Chol/HDL Ratio: 3.1 ratio (ref 0.0–5.0)
Cholesterol, Total: 139 mg/dL (ref 100–199)
HDL: 45 mg/dL (ref >39)
LDL Chol Calc (NIH): 75 mg/dL (ref 0–99)
Triglycerides: 102 mg/dL (ref 0–149)
VLDL Cholesterol Cal: 19 mg/dL (ref 5–40)

## 2020-06-25 LAB — HEPATIC FUNCTION PANEL
ALT: 24 IU/L (ref 0–44)
AST: 15 IU/L (ref 0–40)
Albumin: 3.7 g/dL — ABNORMAL LOW (ref 3.8–4.8)
Alkaline Phosphatase: 121 IU/L (ref 48–121)
Bilirubin Total: 1.3 mg/dL — ABNORMAL HIGH (ref 0.0–1.2)
Bilirubin, Direct: 0.32 mg/dL (ref 0.00–0.40)
Total Protein: 5.8 g/dL — ABNORMAL LOW (ref 6.0–8.5)

## 2020-06-26 ENCOUNTER — Ambulatory Visit (INDEPENDENT_AMBULATORY_CARE_PROVIDER_SITE_OTHER): Payer: Medicare Other | Admitting: Interventional Cardiology

## 2020-06-26 ENCOUNTER — Encounter: Payer: Self-pay | Admitting: *Deleted

## 2020-06-26 ENCOUNTER — Encounter: Payer: Self-pay | Admitting: Interventional Cardiology

## 2020-06-26 VITALS — BP 94/60 | HR 72 | Ht 67.0 in | Wt 175.2 lb

## 2020-06-26 DIAGNOSIS — E785 Hyperlipidemia, unspecified: Secondary | ICD-10-CM

## 2020-06-26 DIAGNOSIS — Z7189 Other specified counseling: Secondary | ICD-10-CM | POA: Diagnosis not present

## 2020-06-26 DIAGNOSIS — I252 Old myocardial infarction: Secondary | ICD-10-CM | POA: Diagnosis not present

## 2020-06-26 DIAGNOSIS — I25118 Atherosclerotic heart disease of native coronary artery with other forms of angina pectoris: Secondary | ICD-10-CM | POA: Diagnosis not present

## 2020-06-26 DIAGNOSIS — I5021 Acute systolic (congestive) heart failure: Secondary | ICD-10-CM

## 2020-06-26 NOTE — Patient Instructions (Signed)
Medication Instructions:  Your physician recommends that you continue on your current medications as directed. Please refer to the Current Medication list given to you today.  *If you need a refill on your cardiac medications before your next appointment, please call your pharmacy*   Lab Work: None If you have labs (blood work) drawn today and your tests are completely normal, you will receive your results only by: Marland Kitchen MyChart Message (if you have MyChart) OR . A paper copy in the mail If you have any lab test that is abnormal or we need to change your treatment, we will call you to review the results.   Testing/Procedures: Your physician has requested that you have an echocardiogram a few days prior to seeing Dr. Katrinka Blazing back in 3 months. Echocardiography is a painless test that uses sound waves to create images of your heart. It provides your doctor with information about the size and shape of your heart and how well your heart's chambers and valves are working. This procedure takes approximately one hour. There are no restrictions for this procedure.     Follow-Up: At [facility], you and your health needs are our priority.  As part of our continuing mission to provide you with exceptional heart care, we have created designated Provider Care Teams.  These Care Teams include your primary Cardiologist (physician) and Advanced Practice Providers (APPs -  Physician Assistants and Nurse Practitioners) who all work together to provide you with the care you need, when you need it.  We recommend signing up for the patient portal called "MyChart".  Sign up information is provided on this After Visit Summary.  MyChart is used to connect with patients for Virtual Visits (Telemedicine).  Patients are able to view lab/test results, encounter notes, upcoming appointments, etc.  Non-urgent messages can be sent to your provider as well.   To learn more about what you can do with MyChart, go to  [email or web address].    Your next appointment:   3 month(s)  The format for your next appointment:   In Person  Provider:   You may see Lesleigh Noe, MD or one of the following Advanced Practice Providers on your designated Care Team:    Norma Fredrickson, NP  Nada Boozer, NP  Georgie Chard, NP    Other Instructions

## 2020-07-02 DIAGNOSIS — H43813 Vitreous degeneration, bilateral: Secondary | ICD-10-CM | POA: Diagnosis not present

## 2020-07-02 DIAGNOSIS — H43821 Vitreomacular adhesion, right eye: Secondary | ICD-10-CM | POA: Diagnosis not present

## 2020-07-02 DIAGNOSIS — H35371 Puckering of macula, right eye: Secondary | ICD-10-CM | POA: Diagnosis not present

## 2020-07-02 DIAGNOSIS — H35362 Drusen (degenerative) of macula, left eye: Secondary | ICD-10-CM | POA: Diagnosis not present

## 2020-07-02 DIAGNOSIS — B5801 Toxoplasma chorioretinitis: Secondary | ICD-10-CM | POA: Diagnosis not present

## 2020-07-03 ENCOUNTER — Ambulatory Visit: Payer: Medicare Other | Admitting: Interventional Cardiology

## 2020-07-10 DIAGNOSIS — K219 Gastro-esophageal reflux disease without esophagitis: Secondary | ICD-10-CM | POA: Diagnosis not present

## 2020-07-10 DIAGNOSIS — I209 Angina pectoris, unspecified: Secondary | ICD-10-CM | POA: Diagnosis not present

## 2020-07-10 DIAGNOSIS — I252 Old myocardial infarction: Secondary | ICD-10-CM | POA: Diagnosis not present

## 2020-07-10 DIAGNOSIS — Z0000 Encounter for general adult medical examination without abnormal findings: Secondary | ICD-10-CM | POA: Diagnosis not present

## 2020-07-10 DIAGNOSIS — I10 Essential (primary) hypertension: Secondary | ICD-10-CM | POA: Diagnosis not present

## 2020-07-10 DIAGNOSIS — R5383 Other fatigue: Secondary | ICD-10-CM | POA: Diagnosis not present

## 2020-07-10 DIAGNOSIS — D563 Thalassemia minor: Secondary | ICD-10-CM | POA: Diagnosis not present

## 2020-07-10 DIAGNOSIS — E663 Overweight: Secondary | ICD-10-CM | POA: Diagnosis not present

## 2020-07-10 DIAGNOSIS — J449 Chronic obstructive pulmonary disease, unspecified: Secondary | ICD-10-CM | POA: Diagnosis not present

## 2020-07-10 DIAGNOSIS — I25119 Atherosclerotic heart disease of native coronary artery with unspecified angina pectoris: Secondary | ICD-10-CM | POA: Diagnosis not present

## 2020-07-10 DIAGNOSIS — D7212 Drug rash with eosinophilia and systemic symptoms syndrome: Secondary | ICD-10-CM | POA: Diagnosis not present

## 2020-07-10 DIAGNOSIS — I999 Unspecified disorder of circulatory system: Secondary | ICD-10-CM | POA: Diagnosis not present

## 2020-07-17 ENCOUNTER — Telehealth: Payer: Self-pay | Admitting: Interventional Cardiology

## 2020-07-17 NOTE — Telephone Encounter (Signed)
Spoke with pt and made him aware that EF in July was 35-40%.  Explained that we scheduled a f/u echo in Nov to see if EF improves after being on medications for a period of time.  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  Pt was hoping to be able to do his echo in the next couple of weeks.  Explained to pt that there may not be an improvement yet because he hasn't been on the medications very long.  Pt would still like to do echo sooner.  Advised I will send to Dr. Katrinka Blazing for review.

## 2020-07-17 NOTE — Telephone Encounter (Signed)
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 Please advise.

## 2020-07-18 NOTE — Telephone Encounter (Signed)
Okay to do echo earlier. Agree with your explanation to patient. My end up having more echo's this way.

## 2020-07-20 NOTE — Telephone Encounter (Signed)
Spoke with pt and made him aware.  He was at work.  Advised I will make new appt and it will show up in MyChart and if that doesn't work, call when he has a chance and we can get it moved.

## 2020-07-30 DIAGNOSIS — H35371 Puckering of macula, right eye: Secondary | ICD-10-CM | POA: Diagnosis not present

## 2020-07-30 DIAGNOSIS — H43813 Vitreous degeneration, bilateral: Secondary | ICD-10-CM | POA: Diagnosis not present

## 2020-07-30 DIAGNOSIS — B5801 Toxoplasma chorioretinitis: Secondary | ICD-10-CM | POA: Diagnosis not present

## 2020-07-30 DIAGNOSIS — H35362 Drusen (degenerative) of macula, left eye: Secondary | ICD-10-CM | POA: Diagnosis not present

## 2020-07-31 ENCOUNTER — Ambulatory Visit (HOSPITAL_COMMUNITY): Payer: Medicare Other | Attending: Cardiology

## 2020-07-31 ENCOUNTER — Other Ambulatory Visit: Payer: Self-pay

## 2020-07-31 DIAGNOSIS — I5021 Acute systolic (congestive) heart failure: Secondary | ICD-10-CM

## 2020-07-31 MED ORDER — PERFLUTREN LIPID MICROSPHERE
1.0000 mL | INTRAVENOUS | Status: AC | PRN
  Administered 2020-07-31: 3 mL via INTRAVENOUS

## 2020-08-01 LAB — ECHOCARDIOGRAM COMPLETE
Area-P 1/2: 4.06 cm2
S' Lateral: 3.5 cm

## 2020-08-02 DIAGNOSIS — I5021 Acute systolic (congestive) heart failure: Secondary | ICD-10-CM

## 2020-08-03 MED ORDER — SACUBITRIL-VALSARTAN 24-26 MG PO TABS: 1.0000 | ORAL_TABLET | Freq: Two times a day (BID) | ORAL | 11 refills | Status: DC

## 2020-08-03 NOTE — Addendum Note (Signed)
Addended by: Julio Sicks on: 08/03/2020 10:33 AM   Modules accepted: Orders

## 2020-08-04 NOTE — Telephone Encounter (Signed)
Called and spoke with pt.  States he has never taken Losartan.  Lisinopril 2.5mg  was prescribed by a Dr. Allena Katz.  Pt did take a half tablet of Entresto this morning.  Advised pt no more Entresto today, no Entresto or Lisinopril tomorrow and start Entresto back on Thursday.  Pt verbalized understanding and was in agreement with plan.

## 2020-08-18 ENCOUNTER — Other Ambulatory Visit: Payer: Medicare Other | Admitting: *Deleted

## 2020-08-18 ENCOUNTER — Other Ambulatory Visit: Payer: Self-pay

## 2020-08-18 DIAGNOSIS — I5021 Acute systolic (congestive) heart failure: Secondary | ICD-10-CM

## 2020-08-19 LAB — BASIC METABOLIC PANEL
BUN/Creatinine Ratio: 22 (ref 10–24)
BUN: 22 mg/dL (ref 8–27)
CO2: 22 mmol/L (ref 20–29)
Calcium: 9.2 mg/dL (ref 8.6–10.2)
Chloride: 108 mmol/L — ABNORMAL HIGH (ref 96–106)
Creatinine, Ser: 1 mg/dL (ref 0.76–1.27)
GFR calc Af Amer: 90 mL/min/{1.73_m2} (ref >59)
GFR calc non Af Amer: 78 mL/min/{1.73_m2} (ref >59)
Glucose: 128 mg/dL — ABNORMAL HIGH (ref 65–99)
Potassium: 4.1 mmol/L (ref 3.5–5.2)
Sodium: 143 mmol/L (ref 134–144)

## 2020-08-24 ENCOUNTER — Telehealth: Payer: Self-pay

## 2020-08-24 ENCOUNTER — Encounter: Payer: Self-pay | Admitting: *Deleted

## 2020-08-24 MED ORDER — CLOPIDOGREL BISULFATE 75 MG PO TABS: 75.0000 mg | ORAL_TABLET | Freq: Every day | ORAL | 3 refills | Status: DC

## 2020-08-24 NOTE — Telephone Encounter (Signed)
Lyn Records, MD  Cheree Ditto, [facility]; Julio Sicks, RN He will need to switch to Plavix at completion of current Brilinta. Recommend 300 mg load of Plavix with last dose of Brilinta then plavix 75 mg daily thereafter.   Spoke with pt and made him aware of recommendations per Dr. Katrinka Blazing.  Advised pt to take 4 Plavix tablets along with his last dose of Brilinta.  Pt verbalized understanding.  He also asked that I send him a MyChart message with these instructions.

## 2020-08-24 NOTE — Telephone Encounter (Signed)
**Note De-Identified  Obfuscation** Per [facility] a prior Berkley Harvey is not needed for his Brilinta and that it looks like this is a deductible issue.  I called the pt to discuss and he is unsure if he still owes on his deductible or not but states that he cannot afford $550 for a 90 day supply.  The pt wants to switch to a less costing antiplatelet medication. We discussed him [place] with [place] and [place] but he is more interested in switching to Clopidogrel if Dr Katrinka Blazing is ok him switching.  He is aware that I am sending this phone note to Dr Katrinka Blazing ad his nurse for advisement.

## 2020-08-24 NOTE — Telephone Encounter (Signed)
**Note De-identified  Obfuscation** -----  **Note De-Identified  Obfuscation** Message from Martin Sicks, RN sent at 08/21/2020 10:40 AM EDT ----- Regarding: FW: brilinta price Larita Fife,  Can we see if a prior Berkley Harvey can be done to get the cost down?  I spoke with the pt and he has about 5 weeks of Brilinta left.  States pharmacy didn't mention anything about him being in the donut hole.    Thanks Martin Mcdowell ----- Message ----- From: Martin Mcdowell, [facility] Sent: 08/21/2020  10:35 AM EDT To: Martin Records, MD, Martin Sicks, RN Subject: brilinta price                                 Martin Mcdowell,  Patient sent a mychart message that a 3 month supply for Brilinta is over 500 dollars.  He paid this price 3 months ago but does not think he can continue.  Checked his formulary and his insurance plan and it does not look like we can get around this.  Consider switching to plavix?  ~Martin Mcdowell

## 2020-08-25 ENCOUNTER — Telehealth: Payer: Self-pay | Admitting: Interventional Cardiology

## 2020-08-25 NOTE — Telephone Encounter (Signed)
New message:     Patient calling to ask about some changes in his medications and would like to speak with the nurse.

## 2020-08-25 NOTE — Telephone Encounter (Signed)
Stay with Plavix

## 2020-08-25 NOTE — Telephone Encounter (Signed)
The patient has enough Brilinta to last another month before he switches to Plavix (he is in the donut hole). Since insurance will reset at the beginning of the year, he would like Dr. Michaelle Copas opinion on if he should switch back to [facility] in January or stay on Plavix. He understands he will be called with his recommendations.

## 2020-08-26 NOTE — Telephone Encounter (Signed)
Spoke with pt and made him aware to continue with the plan to switch to Plavix.  Pt appreciative for call.

## 2020-08-28 ENCOUNTER — Ambulatory Visit: Payer: Medicare Other | Admitting: Internal Medicine

## 2020-09-03 DIAGNOSIS — H35371 Puckering of macula, right eye: Secondary | ICD-10-CM | POA: Diagnosis not present

## 2020-09-03 DIAGNOSIS — H35362 Drusen (degenerative) of macula, left eye: Secondary | ICD-10-CM | POA: Diagnosis not present

## 2020-09-03 DIAGNOSIS — B5801 Toxoplasma chorioretinitis: Secondary | ICD-10-CM | POA: Diagnosis not present

## 2020-09-03 DIAGNOSIS — H43813 Vitreous degeneration, bilateral: Secondary | ICD-10-CM | POA: Diagnosis not present

## 2020-09-03 NOTE — Telephone Encounter (Signed)
Spoke with pt and reviewed that we had changed him to [facility] in October when his echo came back showing that his EF was still low.  Pt doesn't recall this.  Advised we had told him to take a half tablet for a week and then increase to a full tablet.  Pt states he does remember "halfing a bunch of pills" but doesn't recall which medication it is.  He is currently at the eye doctor.  He asked me to send him the name of the medication through MyChart and he will check it when he gets home and write back. Pt states he has not been taking Losartan since I told him to stop.

## 2020-09-04 DIAGNOSIS — I25119 Atherosclerotic heart disease of native coronary artery with unspecified angina pectoris: Secondary | ICD-10-CM | POA: Diagnosis not present

## 2020-09-04 DIAGNOSIS — I10 Essential (primary) hypertension: Secondary | ICD-10-CM | POA: Diagnosis not present

## 2020-09-04 DIAGNOSIS — E663 Overweight: Secondary | ICD-10-CM | POA: Diagnosis not present

## 2020-09-04 DIAGNOSIS — M545 Low back pain, unspecified: Secondary | ICD-10-CM | POA: Diagnosis not present

## 2020-09-04 DIAGNOSIS — I700 Atherosclerosis of aorta: Secondary | ICD-10-CM | POA: Diagnosis not present

## 2020-09-04 DIAGNOSIS — M549 Dorsalgia, unspecified: Secondary | ICD-10-CM | POA: Diagnosis not present

## 2020-09-04 DIAGNOSIS — I509 Heart failure, unspecified: Secondary | ICD-10-CM | POA: Diagnosis not present

## 2020-09-04 DIAGNOSIS — E785 Hyperlipidemia, unspecified: Secondary | ICD-10-CM | POA: Diagnosis not present

## 2020-09-04 DIAGNOSIS — K219 Gastro-esophageal reflux disease without esophagitis: Secondary | ICD-10-CM | POA: Diagnosis not present

## 2020-09-04 DIAGNOSIS — I252 Old myocardial infarction: Secondary | ICD-10-CM | POA: Diagnosis not present

## 2020-09-04 DIAGNOSIS — Z008 Encounter for other general examination: Secondary | ICD-10-CM | POA: Diagnosis not present

## 2020-09-04 DIAGNOSIS — Z0001 Encounter for general adult medical examination with abnormal findings: Secondary | ICD-10-CM | POA: Diagnosis not present

## 2020-09-10 ENCOUNTER — Other Ambulatory Visit (HOSPITAL_COMMUNITY): Payer: Medicare Other

## 2020-09-23 NOTE — Progress Notes (Signed)
Cardiology Office Note:    Date:  09/25/2020   ID:  Tamala Julian, DOB 1951-04-26, MRN [id number]  PCP:  Patient, No Pcp Per  Cardiologist:  Lesleigh Noe, MD   Referring MD: Virl Cagey, MD   Chief Complaint  Patient presents with  . Coronary Artery Disease    History of Present Illness:    Martin Mcdowell is a 69 y.o. male with a hx of anterior MI 05/15/2020, acute systolic heart failureEF 35 to 40% (04/2020) and unchanged 07/31/2020 after GDMT, hypertension, DRESS syndrome, and hyperlipidemia presents for f/u post MI.  He feels well.  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  He does not have specific complaints other than to state he does not have more energy than he did prior to his heart attack.  He says he was always told that if you get stents put and you have more energy.  I helped him to understand that he has stents put in but in the setting of a heart attack which damaged the heart.  Denies orthopnea, PND, syncope, lower extremity swelling, and orthopnea.  Past Medical History:  Diagnosis Date  . Anemia   . DRESS syndrome   . GERD (gastroesophageal reflux disease)   . Hiatal hernia   . Hypertension   . 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 Toxoplasmosis     Past Surgical History:  Procedure Laterality Date  . CORONARY STENT INTERVENTION N/A 05/18/2020   Procedure: CORONARY STENT INTERVENTION;  Surgeon: Lyn Records, MD;  Location: [facility] INVASIVE CV LAB;  Service: Cardiovascular;  Laterality: N/A;  . CORONARY/GRAFT ACUTE MI REVASCULARIZATION N/A 05/15/2020   Procedure: CORONARY/GRAFT ACUTE MI REVASCULARIZATION;  Surgeon: Lyn Records, MD;  Location: [facility] INVASIVE CV LAB;  Service: Cardiovascular;  Laterality: N/A;  . NO PAST SURGERIES      Current Medications: Current Meds  Medication Sig  . aspirin EC 81 MG EC tablet Take 1 tablet (81 mg total) by mouth daily. Swallow  whole.  Marland Kitchen atorvastatin (LIPITOR) 80 MG tablet Take 1 tablet (80 mg total) by mouth daily.  . clopidogrel (PLAVIX) 75 MG tablet Take 1 tablet (75 mg total) by mouth daily.  . metoprolol succinate (TOPROL-XL) 25 MG 24 hr tablet Take 1 tablet (25 mg total) by mouth daily.  . nitroGLYCERIN (NITROSTAT) 0.4 MG SL tablet Place 1 tablet (0.4 mg total) under the tongue every 5 (five) minutes x 3 doses as needed for chest pain.  . pantoprazole (PROTONIX) 40 MG tablet Take 40 mg by mouth daily.  . sacubitril-valsartan (ENTRESTO) 24-26 MG Take 1 tablet by mouth 2 (two) times daily.  . [DISCONTINUED] metoprolol succinate (TOPROL-XL) 25 MG 24 hr tablet Take 0.5 tablets (12.5 mg total) by mouth daily.     Allergies:   Sulfamethoxazole-trimethoprim   Social History   Socioeconomic History  . Marital status: Married    Spouse name: Not on file  . Number of children: Not on file  . Years of education: Not on file  . Highest education level: Not on file  Occupational History  . Not on file  Tobacco Use  . Smoking status: Former Smoker    Types: Cigarettes  . Smokeless tobacco: Never Used  Vaping Use  . Vaping Use: Never used  Substance and Sexual Activity  . Alcohol use: Yes    Alcohol/week: 2.0 - 3.0  standard drinks    Types: 2 - 3 Standard drinks or equivalent per week    Comment: Occ   . Drug use: No  . Sexual activity: Not on file  Other Topics Concern  . Not on file  Social History Narrative  . Not on file   Social Determinants of Health   Financial Resource Strain:   . Difficulty of Paying Living Expenses: Not on file  Food Insecurity:   . Worried About Programme researcher, broadcasting/film/videounning Out of Food in the Last Year: Not on file  . Ran Out of Food in the Last Year: Not on file  Transportation Needs:   . Lack of Transportation (Medical): Not on file  . Lack of Transportation (Non-Medical): Not on file  Physical Activity:   . Days of Exercise per Week: Not on file  . Minutes of Exercise per Session: Not on  file  Stress:   . Feeling of Stress : Not on file  Social Connections:   . Frequency of Communication with Friends and Family: Not on file  . Frequency of Social Gatherings with Friends and Family: Not on file  . Attends Religious Services: Not on file  . Active Member of Clubs or Organizations: Not on file  . Attends BankerClub or Organization Meetings: Not on file  . Marital Status: Not on file     Family History: The patient's family history is negative for Colon cancer, Stomach cancer, Pancreatic cancer, Esophageal cancer, and Rectal cancer.  ROS:   Please see the history of present illness.    No new complaints.  All other systems reviewed and are negative.  EKGs/Labs/Other Studies Reviewed:    The following studies were reviewed today: No new imaging other than the echocardiogram done July 31, 2020 IMPRESSIONS    1. Left ventricular ejection fraction, by estimation, is 35 to 40%. The  left ventricle has moderately decreased function. The left ventricle  demonstrates regional wall motion abnormalities (see scoring  diagram/findings for description). Left ventricular  diastolic parameters are consistent with Grade II diastolic dysfunction  (pseudonormalization). Elevated left atrial pressure.  2. There is significant swirling artifact at LV apex on Definity images,  but no clear thrombus seen  3. Right ventricular systolic function is normal. The right ventricular  size is normal. Tricuspid regurgitation signal is inadequate for assessing  PA pressure.  4. The mitral valve is normal in structure. Trivial mitral valve  regurgitation.  5. The aortic valve is tricuspid. Aortic valve regurgitation is trivial.  Mild aortic valve sclerosis is present, with no evidence of aortic valve  stenosis.  6. Possible PFO, not well visualized.  7. 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  8. The inferior vena cava is normal in size with greater than 50%    respiratory variability, suggesting right atrial pressure of 3 mmHg.   Comparison(s): Compared to prior echo 05/16/20, no significant change in LV  systolic function.   EKG:  EKG not repeated  Recent Labs: 05/19/2020: Hemoglobin 11.4; Platelets 246 06/25/2020: ALT 24 08/18/2020: BUN 22; Creatinine, Ser 1.00; Potassium 4.1; Sodium 143  Recent Lipid Panel    Component Value Date/Time   CHOL 139 06/25/2020 0945   TRIG 102 06/25/2020 0945   HDL 45 06/25/2020 0945   CHOLHDL 3.1 06/25/2020 0945   CHOLHDL 5.4 05/15/2020 1109   VLDL 29 05/15/2020 1109   LDLCALC 75 06/25/2020 0945    Physical Exam:    VS:  BP 115/80   Pulse  79   Ht 5\' 7"  (1.702 m)   Wt 178 lb 9.6 oz (81 kg)   SpO2 95%   BMI 27.97 kg/m     Wt Readings from Last 3 Encounters:  09/25/20 178 lb 9.6 oz (81 kg)  06/26/20 175 lb 3.2 oz (79.5 kg)  06/01/20 176 lb 9.6 oz (80.1 kg)     GEN: Slightly overweight. No acute distress HEENT: Normal NECK: No JVD. LYMPHATICS: No lymphadenopathy CARDIAC:  RRR without murmur, gallop, or edema. VASCULAR:  Normal Pulses. No bruits. RESPIRATORY:  Clear to auscultation without rales, wheezing or rhonchi  ABDOMEN: Soft, non-tender, non-distended, No pulsatile mass, MUSCULOSKELETAL: No deformity  SKIN: Warm and dry NEUROLOGIC:  Alert and oriented x 3 PSYCHIATRIC:  Normal affect   ASSESSMENT:    1. Acute systolic heart failure (HCC)   2. Coronary artery disease of native artery of native heart with stable angina pectoris (HCC)   3. Hyperlipidemia LDL goal <70   4. Acute anterior wall MI (HCC)   5. Educated about COVID-19 virus infection    PLAN:    In order of problems listed above:  1. He is chronic combined systolic and diastolic heart failure without evidence of volume overload or symptoms of heart failure.  Increase Toprol-XL to 25 mg/day.  In 7 to 10 days if feeling well, increase Entresto to 224/26 mg Entresto tablets p.m. and 1 tablet a.m.  After that change she  should return to the office in 7 to 14 days for blood pressure reassessment. 2. Discontinue Brilinta and switch to clopidogrel and aspirin. 3. Continue Lipitor 80 mg/day. 4. No further discussion 5. Vaccinated and boosted   We are now uptitrating heart failure therapy.  Blood pressure has improved compared to immediately post MI.  Increase metoprolol succinate to 25 mg/day.  Increase the Entresto to 24/26 mg 1 tablet a.m. and 2 tablets each p.m.  On the next office visit hopefully we will be able to increase the Entresto to the 49/51 mg twice daily dose.  I will need to see him in 3 to 4 months and thereafter we will consider a repeat echocardiogram.  Medication Adjustments/Labs and Tests Ordered: Current medicines are reviewed at length with the patient today.  Concerns regarding medicines are outlined above.  No orders of the defined types were placed in this encounter.  Meds ordered this encounter  Medications  . metoprolol succinate (TOPROL-XL) 25 MG 24 hr tablet    Sig: Take 1 tablet (25 mg total) by mouth daily.    Dispense:  90 tablet    Refill:  3    Dose change    Patient Instructions  Medication Instructions:  1) INCREASE Metoprolol Succinate to 25mg  once daily 2) INCREASE Entresto 24/26mg  to one tablet in the morning and 2 tablets in the evening until you see 08/01/20 back in 3 weeks.    *If you need a refill on your cardiac medications before your next appointment, please call your pharmacy*   Lab Work: None If you have labs (blood work) drawn today and your tests are completely normal, you will receive your results only by: MyChart Message (if you have MyChart) OR . A paper copy in the mail If you have any lab test that is abnormal or we need to change your treatment, we will call you to review the results.   Testing/Procedures: None   Follow-Up: At [facility], you and your health needs are our priority.  As part of our continuing  mission to provide you with  exceptional heart care, we have created designated Provider Care Teams.  These Care Teams include your primary Cardiologist (physician) and Advanced Practice Providers (APPs -  Physician Assistants and Nurse Practitioners) who all work together to provide you with the care you need, when you need it.  We recommend signing up for the patient portal called "MyChart".  Sign up information is provided on this After Visit Summary.  MyChart is used to connect with patients for Virtual Visits (Telemedicine).  Patients are able to view lab/test results, encounter notes, upcoming appointments, etc.  Non-urgent messages can be sent to your provider as well.   To learn more about what you can do with MyChart, go to [email or web address].    Your next appointment:   3 week(s)  The format for your next appointment:   In Person  Provider:   You will see one of the following Advanced Practice Providers on your designated Care Team:    Norma Fredrickson, NP  Nada Boozer, NP  Georgie Chard, NP  Then, Lesleigh Noe, MD will plan to see you again in 3-4 month(s).   Other Instructions      Signed, Lesleigh Noe, MD  09/25/2020 9:08 AM    [facility]

## 2020-09-25 ENCOUNTER — Ambulatory Visit (INDEPENDENT_AMBULATORY_CARE_PROVIDER_SITE_OTHER): Payer: Medicare Other | Admitting: Interventional Cardiology

## 2020-09-25 ENCOUNTER — Encounter: Payer: Self-pay | Admitting: Interventional Cardiology

## 2020-09-25 ENCOUNTER — Other Ambulatory Visit: Payer: Self-pay

## 2020-09-25 VITALS — BP 120/80 | HR 79 | Ht 67.0 in | Wt 178.6 lb

## 2020-09-25 DIAGNOSIS — Z7189 Other specified counseling: Secondary | ICD-10-CM | POA: Diagnosis not present

## 2020-09-25 DIAGNOSIS — I25118 Atherosclerotic heart disease of native coronary artery with other forms of angina pectoris: Secondary | ICD-10-CM

## 2020-09-25 DIAGNOSIS — I5021 Acute systolic (congestive) heart failure: Secondary | ICD-10-CM

## 2020-09-25 DIAGNOSIS — E785 Hyperlipidemia, unspecified: Secondary | ICD-10-CM | POA: Diagnosis not present

## 2020-09-25 DIAGNOSIS — I2109 ST elevation (STEMI) myocardial infarction involving other coronary artery of anterior wall: Secondary | ICD-10-CM | POA: Diagnosis not present

## 2020-09-25 MED ORDER — METOPROLOL SUCCINATE ER 25 MG PO TB24
25.0000 mg | ORAL_TABLET | Freq: Every day | ORAL | 3 refills | Status: DC
Start: 2020-09-25 — End: 2020-12-15

## 2020-09-25 NOTE — Patient Instructions (Signed)
Medication Instructions:  1) INCREASE Metoprolol Succinate to 25mg  once daily 2) INCREASE Entresto 24/26mg  to one tablet in the morning and 2 tablets in the evening until you see back in 3 weeks.    *If you need a refill on your cardiac medications before your next appointment, please call your pharmacy*   Lab Work: None If you have labs (blood work) drawn today and your tests are completely normal, you will receive your results only by: [place] MyChart Message (if you have MyChart) OR . A paper copy in the mail If you have any lab test that is abnormal or we need to change your treatment, we will call you to review the results.   Testing/Procedures: None   Follow-Up: At [facility], you and your health needs are our priority.  As part of our continuing mission to provide you with exceptional heart care, we have created designated Provider Care Teams.  These Care Teams include your primary Cardiologist (physician) and Advanced Practice Providers (APPs -  Physician Assistants and Nurse Practitioners) who all work together to provide you with the care you need, when you need it.  We recommend signing up for the patient portal called "MyChart".  Sign up information is provided on this After Visit Summary.  MyChart is used to connect with patients for Virtual Visits (Telemedicine).  Patients are able to view lab/test results, encounter notes, upcoming appointments, etc.  Non-urgent messages can be sent to your provider as well.   To learn more about what you can do with MyChart, go to [facility].    Your next appointment:   3 week(s)  The format for your next appointment:   In Person  Provider:   You will see one of the following Advanced Practice Providers on your designated Care Team:    [email or web address], NP  Norma Fredrickson, NP  Nada Boozer, NP  Then, Georgie Chard, MD will plan to see you again in 3-4 month(s).   Other Instructions

## 2020-09-30 NOTE — Progress Notes (Signed)
CARDIOLOGY OFFICE NOTE  Date:  10/14/2020    Martin Mcdowell Date of Birth: [birth year] Medical Record #[id number]  PCP:  Patient, No Pcp Per  Cardiologist:  Tamala Julian    Chief Complaint  Patient presents with  . Follow-up    Seen for Dr. Tamala Julian    History of Present Illness: Martin Mcdowell is a 69 y.o. male who presents today for a follow up visit. Seen for Dr. Tamala Julian.   He has a history of known CAD with prior anterior MI in July of 1324, chronic systolic HF with 35 to 40% - unchanged despite guideline medicine, HTN, DRESS syndrome and HLD.   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 No energy noted. Was changed over to Plavix. Toprol and Entresto increased. Plan to repeat echo again in a few months.   Comes in today. Here with his wife. He had some chest pain yesterday. Notes it was very different from prior chest pain syndrome - this was more of a stabbing pain - did not really seem too bothered by this - no NTG use - was going out to eat. Lasted about an hour. Has not recurred. Has had a cold that he has gotten over since last here. He is not sure about his medicines. Lisinopril is on his list - he is also on Entresto. Not dizzy. Not short of breath. No swelling.   Past Medical History:  Diagnosis Date  . Anemia   . DRESS syndrome   . GERD (gastroesophageal reflux disease)   . Hiatal hernia   . Hypertension   . 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 Toxoplasmosis     Past Surgical History:  Procedure Laterality Date  . CORONARY STENT INTERVENTION N/A 05/18/2020   Procedure: CORONARY STENT INTERVENTION;  Surgeon: Belva Crome, MD;  Location: [facility];  Service: Cardiovascular;  Laterality: N/A;  . CORONARY/GRAFT ACUTE MI REVASCULARIZATION N/A 05/15/2020   Procedure: CORONARY/GRAFT ACUTE MI REVASCULARIZATION;  Surgeon: Belva Crome, MD;  Location: [facility];  Service: Cardiovascular;   Laterality: N/A;  . NO PAST SURGERIES       Medications: Current Meds  Medication Sig  . aspirin EC 81 MG EC tablet Take 1 tablet (81 mg total) by mouth daily. Swallow whole.  Marland Kitchen atorvastatin (LIPITOR) 80 MG tablet Take 1 tablet (80 mg total) by mouth daily.  . clopidogrel (PLAVIX) 75 MG tablet Take 1 tablet (75 mg total) by mouth daily.  . metoprolol succinate (TOPROL-XL) 25 MG 24 hr tablet Take 1 tablet (25 mg total) by mouth daily.  . nitroGLYCERIN (NITROSTAT) 0.4 MG SL tablet Place 1 tablet (0.4 mg total) under the tongue every 5 (five) minutes x 3 doses as needed for chest pain.  . pantoprazole (PROTONIX) 40 MG tablet Take 40 mg by mouth daily.  . [DISCONTINUED] lisinopril (ZESTRIL) 2.5 MG tablet Take 2.5 mg by mouth daily.     Allergies: Allergies  Allergen Reactions  . Sulfamethoxazole-Trimethoprim Anaphylaxis and Rash    DRESS syndrome requiring hospitalization; 12/11/2018    Social History: The patient  reports that he has quit smoking. His smoking use included cigarettes. He has never used smokeless tobacco. He reports current alcohol use of about 2.0 - 3.0 standard drinks of alcohol per week. He reports that he does not use drugs.   Family History: The patient's family history is  not on file.   Review of Systems: Please see the history of present illness.   All other systems are reviewed and negative.   Physical Exam: VS:  BP 120/84   Pulse 68   Ht '5\' 7"'  (1.702 m)   Wt 179 lb 12.8 oz (81.6 kg)   SpO2 98%   BMI 28.16 kg/m  .  BMI Body mass index is 28.16 kg/m.  Wt Readings from Last 3 Encounters:  10/14/20 179 lb 12.8 oz (81.6 kg)  09/25/20 178 lb 9.6 oz (81 kg)  06/26/20 175 lb 3.2 oz (79.5 kg)    General: Alert and in no acute distress.   Cardiac: Regular rate and rhythm. No murmurs, rubs, or gallops. No edema.  Respiratory:  Lungs are clear to auscultation bilaterally with normal work of breathing.  GI: Soft and nontender.  MS: No deformity or atrophy.  Gait and ROM intact.  Skin: Warm and dry. Color is normal.  Neuro:  Strength and sensation are intact and no gross focal deficits noted.  Psych: Alert, appropriate and with normal affect.   LABORATORY DATA:  EKG:  EKG is ordered today.  Personally reviewed by me. This shows NSR - diffuse anterior T wave inversion - improved from prior tracing.   Lab Results  Component Value Date   WBC 12.2 (H) 05/19/2020   HGB 11.4 (L) 05/19/2020   HCT 37.0 (L) 05/19/2020   PLT 246 05/19/2020   GLUCOSE 128 (H) 08/18/2020   CHOL 139 06/25/2020   TRIG 102 06/25/2020   HDL 45 06/25/2020   LDLCALC 75 06/25/2020   ALT 24 06/25/2020   AST 15 06/25/2020   NA 143 08/18/2020   K 4.1 08/18/2020   CL 108 (H) 08/18/2020   CREATININE 1.00 08/18/2020   BUN 22 08/18/2020   CO2 22 08/18/2020   TSH 1.85 08/07/2018   PSA 1.37 09/18/2018   INR 0.9 05/15/2020   HGBA1C 5.8 (H) 05/15/2020     BNP (last 3 results) No results for input(s): BNP in the last 8760 hours.  ProBNP (last 3 results) No results for input(s): PROBNP in the last 8760 hours.   Other Studies Reviewed Today:  ECHO IMPRESSIONS 07/2020   1. Left ventricular ejection fraction, by estimation, is 35 to 40%. The  left ventricle has moderately decreased function. The left ventricle  demonstrates regional wall motion abnormalities (see scoring  diagram/findings for description). Left ventricular   diastolic parameters are consistent with Grade II diastolic dysfunction  (pseudonormalization). Elevated left atrial pressure.   2. There is significant swirling artifact at LV apex on Definity images,  but no clear thrombus seen   3. Right ventricular systolic function is normal. The right ventricular  size is normal. Tricuspid regurgitation signal is inadequate for assessing  PA pressure.   4. The mitral valve is normal in structure. Trivial mitral valve  regurgitation.   5. The aortic valve is tricuspid. Aortic valve regurgitation is  trivial.  Mild aortic valve sclerosis is present, with no evidence of aortic valve  stenosis.   6. Possible PFO, not well visualized.   7. 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   8. The inferior vena cava is normal in size with greater than 50%  respiratory variability, suggesting right atrial pressure of 3 mmHg.    CORONARY STENT INTERVENTION 04/2020   Conclusion   Proximal to mid 85% RCA successfully treated with a 3.5 x 18 Onyx deployed at high pressure and  reducing stenosis to 0%.  95% distal circumflex treated with a 2.0 x 18 mm Onyx and deployed at 12 atm x 2.  0% stenosis noted with TIMI grade III flow.  Intervention was not performed on the obtuse marginal which contained tandem 50 to 70% stenoses in the mid body and 60% ostial disease.  The previously placed LAD stent during STEMI was widely patent.  LVEDP 12 mmHg before intervention performed today.  RECOMMENDATIONS:   Given normal LVEDP, will discontinue spironolactone.  Resume losartan as blood pressure allows.  Initiate beta-blocker therapy as heart rate and blood pressure allow.  Phase 2 cardiac rehab  Aggressive lipid-lowering to LDL less than 55 with assistance of SGLT2 if needed.  Eligible for discharge in a.m.  CORONARY/GRAFT ACUTE MI REVASCULARIZATION    Conclusion   Anterior myocardial infarction with acute severe onset of chest pain starting an hour prior to presentation at [facility].  Intermittent episodes of pain have been occurring for the previous 48 hours.  Total occlusion of the proximal to mid LAD treated with a 22 x 3.0 Onyx stent postdilated to 3.25 mm in diameter at 13 atm.  TIMI grade III flow noted.  Decreased septal blush was noted.  Widely patent left main  Large second obtuse marginal contains segmental mid body 85% stenosis.  Ostium of the second obtuse marginal contains 50 to 60% stenosis with poststenotic dilatation in the proximal  segment.  Circumflex beyond the origin of the second obtuse marginal contains 95% obstruction.  A small third obtuse marginal arises beyond.  First obtuse marginal is relatively small and contains proximal 75% narrowing.  Right coronary is dominant.  PDA is diffusely diseased greater than 90%.  Proximal to mid RCA contains eccentric 85% stenosis.  Anterior wall demonstrates severe mid anterior wall to inferoapical hypokinesis/dyskinesis with EF 35%.  LVEDP 26 mmHg.  Findings are consistent with acute systolic heart failure.  RECOMMENDATIONS:   Aggrastat x18 hours.  Aspirin and Brilinta times at least 12 months.  Continue ARB.  Add beta-blocker therapy as tolerated by hemodynamics.  Relative bradycardia and low blood pressure currently prevents addition of beta-blocker therapy.  Consider staged multisite intervention on the circumflex and right coronary on Monday depending upon hospital course.  This could also be done within the next 4 to 6 weeks to allow LV recovery.  The Doppler echocardiogram to assess LV function.  Heart failure therapy should include ARB/beta-blocker.  Consideration to MRA and SGLT2 as needed.      ASSESSMENT & PLAN:     1. CAD - prior anterior MI - has had prior PCI to the RCA and dLCX - has known residual disease - chest pain yesterday not like prior chest pain syndrome - EKG improved - reminded to use NTG if needed and let us know if this recurs. Has been changed over to Plavix with aspirin.   2. 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 Plan to repeat echo in a few months.   3. HLD - on statin   4. HTN - BP is ok on his current regimen. Entresto increased today.   Current medicines are reviewed with the patient today.  The patient does not have concerns regarding medicines other than what has been noted above.  The following changes have been made:  See above.  Labs/ tests  ordered today include:    Orders Placed This  Encounter  Procedures  . Basic metabolic panel  . CBC  . EKG 12-Lead     Disposition:   FU with Dr. Tamala Julian as planned.    Patient is agreeable to this plan and will call if any problems develop in the interim.   SignedTruitt Merle, NP  10/14/2020 11:40 AM  [place] [address] [place], [place]  [address] Phone: [phone] Fax: [phone]

## 2020-10-14 ENCOUNTER — Encounter: Payer: Self-pay | Admitting: Nurse Practitioner

## 2020-10-14 ENCOUNTER — Ambulatory Visit (INDEPENDENT_AMBULATORY_CARE_PROVIDER_SITE_OTHER): Payer: Medicare Other | Admitting: Nurse Practitioner

## 2020-10-14 ENCOUNTER — Other Ambulatory Visit: Payer: Self-pay

## 2020-10-14 VITALS — BP 120/84 | HR 68 | Ht 67.0 in | Wt 179.8 lb

## 2020-10-14 DIAGNOSIS — I5022 Chronic systolic (congestive) heart failure: Secondary | ICD-10-CM | POA: Diagnosis not present

## 2020-10-14 DIAGNOSIS — E785 Hyperlipidemia, unspecified: Secondary | ICD-10-CM | POA: Diagnosis not present

## 2020-10-14 DIAGNOSIS — I25118 Atherosclerotic heart disease of native coronary artery with other forms of angina pectoris: Secondary | ICD-10-CM

## 2020-10-14 DIAGNOSIS — I255 Ischemic cardiomyopathy: Secondary | ICD-10-CM

## 2020-10-14 DIAGNOSIS — I2109 ST elevation (STEMI) myocardial infarction involving other coronary artery of anterior wall: Secondary | ICD-10-CM | POA: Diagnosis not present

## 2020-10-14 DIAGNOSIS — Z1211 Encounter for screening for malignant neoplasm of colon: Secondary | ICD-10-CM | POA: Diagnosis not present

## 2020-10-14 LAB — BASIC METABOLIC PANEL
BUN/Creatinine Ratio: 17 (ref 10–24)
BUN: 17 mg/dL (ref 8–27)
CO2: 23 mmol/L (ref 20–29)
Calcium: 8.8 mg/dL (ref 8.6–10.2)
Chloride: 107 mmol/L — ABNORMAL HIGH (ref 96–106)
Creatinine, Ser: 1.02 mg/dL (ref 0.76–1.27)
GFR calc Af Amer: 87 mL/min/{1.73_m2} (ref >59)
GFR calc non Af Amer: 75 mL/min/{1.73_m2} (ref >59)
Glucose: 91 mg/dL (ref 65–99)
Potassium: 4 mmol/L (ref 3.5–5.2)
Sodium: 140 mmol/L (ref 134–144)

## 2020-10-14 LAB — CBC
Hematocrit: 38.1 % (ref 37.5–51.0)
Hemoglobin: 11.5 g/dL — ABNORMAL LOW (ref 13.0–17.7)
MCH: 19.2 pg — ABNORMAL LOW (ref 26.6–33.0)
MCHC: 30.2 g/dL — ABNORMAL LOW (ref 31.5–35.7)
MCV: 64 fL — ABNORMAL LOW (ref 79–97)
Platelets: 251 10*3/uL (ref 150–450)
RBC: 6 x10E6/uL — ABNORMAL HIGH (ref 4.14–5.80)
RDW: 18.5 % — ABNORMAL HIGH (ref 11.6–15.4)
WBC: 8.9 10*3/uL (ref 3.4–10.8)

## 2020-10-14 MED ORDER — ENTRESTO 49-51 MG PO TABS: 1.0000 | ORAL_TABLET | Freq: Two times a day (BID) | ORAL | 6 refills | Status: DC

## 2020-10-14 NOTE — Patient Instructions (Addendum)
After Visit Summary:  We will be checking the following labs today - BMET and CBC   Medication Instructions:    Continue with your current medicines. BUT  We are going to try and increase the Entresto to 49/51 mg dose twice a day - you can take 2 of the 24/26 mg tablets twice a day and use those up - I have sent the RX for the 49/51 mg dose to the pharmacy - this will be just one pill twice a day  Make sure you are not on Lisinopril - if you have been taking this - let us know   If you need a refill on your cardiac medications before your next appointment, please call your pharmacy.     Testing/Procedures To Be Arranged:  N/A  Follow-Up:   See Dr. Katrinka Blazing as planned in April.     At [facility], you and your health needs are our priority.  As part of our continuing mission to provide you with exceptional heart care, we have created designated Provider Care Teams.  These Care Teams include your primary Cardiologist (physician) and Advanced Practice Providers (APPs -  Physician Assistants and Nurse Practitioners) who all work together to provide you with the care you need, when you need it.  Special Instructions:  . Stay safe, wash your hands for at least 20 seconds and wear a mask when needed.  . It was good to talk with you today.    Call the [facility] office at [phone] if you have any questions, problems or concerns.

## 2020-10-15 NOTE — Progress Notes (Signed)
Noted.   Martin Mcdowell 

## 2020-12-11 ENCOUNTER — Other Ambulatory Visit: Payer: Self-pay | Admitting: Physician Assistant

## 2020-12-14 ENCOUNTER — Emergency Department (HOSPITAL_COMMUNITY): Payer: Medicare Other

## 2020-12-14 ENCOUNTER — Other Ambulatory Visit: Payer: Self-pay

## 2020-12-14 ENCOUNTER — Encounter (HOSPITAL_COMMUNITY): Payer: Self-pay | Admitting: Internal Medicine

## 2020-12-14 ENCOUNTER — Telehealth: Payer: Self-pay | Admitting: Internal Medicine

## 2020-12-14 ENCOUNTER — Inpatient Hospital Stay (HOSPITAL_COMMUNITY)
Admission: EM | Admit: 2020-12-14 | Discharge: 2020-12-18 | DRG: 418 | Disposition: A | Discharge status: Home / Self Care | Payer: Medicare Other | Attending: Internal Medicine | Admitting: Internal Medicine

## 2020-12-14 DIAGNOSIS — I10 Essential (primary) hypertension: Secondary | ICD-10-CM | POA: Diagnosis not present

## 2020-12-14 DIAGNOSIS — I255 Ischemic cardiomyopathy: Secondary | ICD-10-CM | POA: Diagnosis present

## 2020-12-14 DIAGNOSIS — K8042 Calculus of bile duct with acute cholecystitis without obstruction: Secondary | ICD-10-CM | POA: Diagnosis present

## 2020-12-14 DIAGNOSIS — R1013 Epigastric pain: Secondary | ICD-10-CM | POA: Diagnosis not present

## 2020-12-14 DIAGNOSIS — H53459 Other localized visual field defect, unspecified eye: Secondary | ICD-10-CM | POA: Diagnosis present

## 2020-12-14 DIAGNOSIS — E785 Hyperlipidemia, unspecified: Secondary | ICD-10-CM | POA: Diagnosis present

## 2020-12-14 DIAGNOSIS — I5042 Chronic combined systolic (congestive) and diastolic (congestive) heart failure: Secondary | ICD-10-CM

## 2020-12-14 DIAGNOSIS — Z7982 Long term (current) use of aspirin: Secondary | ICD-10-CM | POA: Diagnosis not present

## 2020-12-14 DIAGNOSIS — Z955 Presence of coronary angioplasty implant and graft: Secondary | ICD-10-CM | POA: Diagnosis not present

## 2020-12-14 DIAGNOSIS — I2510 Atherosclerotic heart disease of native coronary artery without angina pectoris: Secondary | ICD-10-CM | POA: Diagnosis present

## 2020-12-14 DIAGNOSIS — K2100 Gastro-esophageal reflux disease with esophagitis, without bleeding: Secondary | ICD-10-CM | POA: Diagnosis present

## 2020-12-14 DIAGNOSIS — Z7902 Long term (current) use of antithrombotics/antiplatelets: Secondary | ICD-10-CM | POA: Diagnosis not present

## 2020-12-14 DIAGNOSIS — K8070 Calculus of gallbladder and bile duct without cholecystitis without obstruction: Secondary | ICD-10-CM | POA: Diagnosis not present

## 2020-12-14 DIAGNOSIS — I110 Hypertensive heart disease with heart failure: Secondary | ICD-10-CM | POA: Diagnosis present

## 2020-12-14 DIAGNOSIS — Z882 Allergy status to sulfonamides status: Secondary | ICD-10-CM | POA: Diagnosis not present

## 2020-12-14 DIAGNOSIS — I252 Old myocardial infarction: Secondary | ICD-10-CM

## 2020-12-14 DIAGNOSIS — Z20822 Contact with and (suspected) exposure to covid-19: Secondary | ICD-10-CM | POA: Diagnosis present

## 2020-12-14 DIAGNOSIS — K8044 Calculus of bile duct with chronic cholecystitis without obstruction: Secondary | ICD-10-CM | POA: Diagnosis present

## 2020-12-14 DIAGNOSIS — Z66 Do not resuscitate: Secondary | ICD-10-CM | POA: Diagnosis present

## 2020-12-14 DIAGNOSIS — B589 Toxoplasmosis, unspecified: Secondary | ICD-10-CM | POA: Diagnosis present

## 2020-12-14 DIAGNOSIS — E876 Hypokalemia: Secondary | ICD-10-CM | POA: Diagnosis present

## 2020-12-14 DIAGNOSIS — I959 Hypotension, unspecified: Secondary | ICD-10-CM | POA: Diagnosis not present

## 2020-12-14 DIAGNOSIS — R1011 Right upper quadrant pain: Secondary | ICD-10-CM | POA: Diagnosis not present

## 2020-12-14 DIAGNOSIS — Z79899 Other long term (current) drug therapy: Secondary | ICD-10-CM

## 2020-12-14 DIAGNOSIS — Z419 Encounter for procedure for purposes other than remedying health state, unspecified: Secondary | ICD-10-CM

## 2020-12-14 DIAGNOSIS — D7212 Drug rash with eosinophilia and systemic symptoms syndrome: Secondary | ICD-10-CM | POA: Diagnosis present

## 2020-12-14 DIAGNOSIS — K851 Biliary acute pancreatitis without necrosis or infection: Secondary | ICD-10-CM | POA: Diagnosis present

## 2020-12-14 DIAGNOSIS — D649 Anemia, unspecified: Secondary | ICD-10-CM | POA: Diagnosis present

## 2020-12-14 DIAGNOSIS — Z87891 Personal history of nicotine dependence: Secondary | ICD-10-CM | POA: Diagnosis not present

## 2020-12-14 DIAGNOSIS — I25110 Atherosclerotic heart disease of native coronary artery with unstable angina pectoris: Secondary | ICD-10-CM | POA: Diagnosis not present

## 2020-12-14 HISTORY — DX: Chronic systolic (congestive) heart failure: I50.22

## 2020-12-14 HISTORY — DX: Atherosclerotic heart disease of native coronary artery without angina pectoris: I25.10

## 2020-12-14 HISTORY — DX: Chronic combined systolic (congestive) and diastolic (congestive) heart failure: I50.42

## 2020-12-14 LAB — BILIRUBIN, DIRECT: Bilirubin, Direct: 2.1 mg/dL — ABNORMAL HIGH (ref 0.0–0.2)

## 2020-12-14 LAB — COMPREHENSIVE METABOLIC PANEL
ALT: 214 U/L — ABNORMAL HIGH (ref 0–44)
AST: 223 U/L — ABNORMAL HIGH (ref 15–41)
Albumin: 3.3 g/dL — ABNORMAL LOW (ref 3.5–5.0)
Alkaline Phosphatase: 476 U/L — ABNORMAL HIGH (ref 38–126)
Anion gap: 11 (ref 5–15)
BUN: 15 mg/dL (ref 8–23)
CO2: 19 mmol/L — ABNORMAL LOW (ref 22–32)
Calcium: 9 mg/dL (ref 8.9–10.3)
Chloride: 108 mmol/L (ref 98–111)
Creatinine, Ser: 1.03 mg/dL (ref 0.61–1.24)
GFR, Estimated: >60 mL/min (ref >60)
Glucose, Bld: 146 mg/dL — ABNORMAL HIGH (ref 70–99)
Potassium: 3.6 mmol/L (ref 3.5–5.1)
Sodium: 138 mmol/L (ref 135–145)
Total Bilirubin: 4.5 mg/dL — ABNORMAL HIGH (ref 0.3–1.2)
Total Protein: 6.9 g/dL (ref 6.5–8.1)

## 2020-12-14 LAB — CBC
HCT: 39.1 % (ref 39.0–52.0)
Hemoglobin: 11.9 g/dL — ABNORMAL LOW (ref 13.0–17.0)
MCH: 18.7 pg — ABNORMAL LOW (ref 26.0–34.0)
MCHC: 30.4 g/dL (ref 30.0–36.0)
MCV: 61.5 fL — ABNORMAL LOW (ref 80.0–100.0)
Platelets: 178 10*3/uL (ref 150–400)
RBC: 6.36 MIL/uL — ABNORMAL HIGH (ref 4.22–5.81)
RDW: 17.9 % — ABNORMAL HIGH (ref 11.5–15.5)
WBC: 11 10*3/uL — ABNORMAL HIGH (ref 4.0–10.5)
nRBC: 0 % (ref 0.0–0.2)

## 2020-12-14 LAB — TROPONIN I (HIGH SENSITIVITY)
Troponin I (High Sensitivity): 16 ng/L (ref <18)
Troponin I (High Sensitivity): 14 ng/L (ref <18)

## 2020-12-14 LAB — RESP PANEL BY RT-PCR (FLU A&B, COVID) ARPGX2
Influenza A by PCR: NEGATIVE
Influenza B by PCR: NEGATIVE
SARS Coronavirus 2 by RT PCR: NEGATIVE

## 2020-12-14 LAB — LIPASE, BLOOD: Lipase: 5978 U/L — ABNORMAL HIGH (ref 11–51)

## 2020-12-14 LAB — LACTIC ACID, PLASMA: Lactic Acid, Venous: 1.3 mmol/L (ref 0.5–1.9)

## 2020-12-14 MED ORDER — PANTOPRAZOLE SODIUM 40 MG PO TBEC
40.0000 mg | DELAYED_RELEASE_TABLET | Freq: Every day | ORAL | Status: DC
  Administered 2020-12-15 – 2020-12-18 (×3): 40 mg via ORAL
  Filled 2020-12-14 (×3): qty 1

## 2020-12-14 MED ORDER — MORPHINE SULFATE (PF) 4 MG/ML IV SOLN
4.0000 mg | Freq: Once | INTRAVENOUS | Status: AC
  Administered 2020-12-14: 4 mg via INTRAVENOUS
  Filled 2020-12-14: qty 1

## 2020-12-14 MED ORDER — ASPIRIN EC 81 MG PO TBEC
81.0000 mg | DELAYED_RELEASE_TABLET | Freq: Every day | ORAL | Status: DC
  Administered 2020-12-15 – 2020-12-16 (×2): 81 mg via ORAL
  Filled 2020-12-14 (×2): qty 1

## 2020-12-14 MED ORDER — HYDRALAZINE HCL 20 MG/ML IJ SOLN: 10.0000 mg | INTRAMUSCULAR | Status: DC | PRN

## 2020-12-14 MED ORDER — ONDANSETRON HCL 4 MG/2ML IJ SOLN: 4.0000 mg | Freq: Four times a day (QID) | INTRAMUSCULAR | Status: DC | PRN

## 2020-12-14 MED ORDER — PIPERACILLIN-TAZOBACTAM 3.375 G IVPB
3.3750 g | Freq: Three times a day (TID) | INTRAVENOUS | Status: DC
  Administered 2020-12-14 – 2020-12-15 (×3): 3.375 g via INTRAVENOUS
  Filled 2020-12-14 (×5): qty 50

## 2020-12-14 MED ORDER — ONDANSETRON 4 MG PO TBDP: 4.0000 mg | ORAL_TABLET | Freq: Four times a day (QID) | ORAL | Status: DC | PRN

## 2020-12-14 MED ORDER — METOPROLOL SUCCINATE ER 25 MG PO TB24
25.0000 mg | ORAL_TABLET | Freq: Every day | ORAL | Status: DC
  Filled 2020-12-14: qty 1

## 2020-12-14 MED ORDER — OXYCODONE HCL 5 MG PO TABS
5.0000 mg | ORAL_TABLET | ORAL | Status: DC | PRN
Start: 2020-12-14 — End: 2020-12-18
  Administered 2020-12-17: 5 mg via ORAL
  Administered 2020-12-17: 10 mg via ORAL
  Administered 2020-12-18 (×3): 5 mg via ORAL
  Filled 2020-12-14 (×2): qty 1
  Filled 2020-12-14: qty 2
  Filled 2020-12-14 (×2): qty 1

## 2020-12-14 MED ORDER — ATORVASTATIN CALCIUM 80 MG PO TABS
80.0000 mg | ORAL_TABLET | Freq: Every day | ORAL | Status: DC
  Administered 2020-12-15 – 2020-12-18 (×3): 80 mg via ORAL
  Filled 2020-12-14 (×3): qty 1

## 2020-12-14 MED ORDER — SACUBITRIL-VALSARTAN 49-51 MG PO TABS
1.0000 | ORAL_TABLET | Freq: Two times a day (BID) | ORAL | Status: DC
  Administered 2020-12-15: 1 via ORAL
  Filled 2020-12-14 (×3): qty 1

## 2020-12-14 MED ORDER — SODIUM CHLORIDE 0.9 % IV BOLUS
500.0000 mL | Freq: Once | INTRAVENOUS | Status: AC
  Administered 2020-12-14: 500 mL via INTRAVENOUS

## 2020-12-14 MED ORDER — IOHEXOL 300 MG/ML  SOLN
100.0000 mL | Freq: Once | INTRAMUSCULAR | Status: AC | PRN
  Administered 2020-12-14: 100 mL via INTRAVENOUS

## 2020-12-14 MED ORDER — LACTATED RINGERS IV SOLN: INTRAVENOUS | Status: DC

## 2020-12-14 MED ORDER — SODIUM CHLORIDE 0.9 % IV SOLN: INTRAVENOUS | Status: DC

## 2020-12-14 MED ORDER — ACETAMINOPHEN 650 MG RE SUPP
650.0000 mg | Freq: Four times a day (QID) | RECTAL | Status: DC | PRN
Start: 2020-12-14 — End: 2020-12-17

## 2020-12-14 MED ORDER — ACETAMINOPHEN 325 MG PO TABS
650.0000 mg | ORAL_TABLET | Freq: Four times a day (QID) | ORAL | Status: DC | PRN
Start: 2020-12-14 — End: 2020-12-17

## 2020-12-14 MED ORDER — MORPHINE SULFATE (PF) 2 MG/ML IV SOLN
2.0000 mg | INTRAVENOUS | Status: DC | PRN
  Administered 2020-12-17: 2 mg via INTRAVENOUS
  Filled 2020-12-14: qty 1

## 2020-12-14 NOTE — Telephone Encounter (Signed)
Pt states he is having a lot of epigastric pain. He states he is taking protonix bid. Discussed with him he could add pepcid 20mg  until he is seen later this week. Pt states he was given a GI cocktail in . Pt states he is going to go to the ER to be seen due to the discomfort.

## 2020-12-14 NOTE — Consult Note (Signed)
[facility] Consult: 12:55 PM 12/14/2020  LOS: 0 days    Referring Provider: Dr. Vira Agar once in the ED Primary Care Physician:  Andree Moro, DO Primary Gastroenterologist:  Dr. Scarlette Shorts.    Reason for Consultation: Simeon Craft pancreatitis, choledocholithiasis.   HPI: Martin Mcdowell is a 70 y.o. male.  PMH CAD.  Anterior MI cardiac stenting 04/2020. CHF.  LVEF 35 to 57%, grade 2 diastolic dysfunction, no significant valvular disease,  Chronic microcytosis, anemia but not iron deficient.  Fall 2019 Cologuard: Negative/normal. 11/2018 EGD (at [facility]) for evaluation of abdominal pain, abnormal appearing esophagus on CT scan.  3 cm hiatal hernia.  Severe desqumating esophagitis with pathology showing necroinflammatory debris and fragments of reactive squamous epithelium with acute inflammation.  Stains negative for viral and fungal elements.  Treated with PPI.  Plan  was for follow-up EGD in 2 months. 02/2019 EGD for follow-up of esophagitis.  Study absolutely normal  Intermittent epigastric pain, anorexia.  No vomiting for the past 2 to 3 weeks.  Within the last day pain became constant.  Stools have a greenish color.  No fever or chills.  Presented for evaluation to the ED.  Pain subsided and has not recurred after receiving a dose of morphine Takes Protonix 40 mg/daily.  Last Plavix was taken this morning.   BPs 1 teens to 120s/70s.  Heart rate low 90s.  Temperature 99.1 F.  Oxygen sats in the mid 90s T bili 4.5, alk phos 476.  AST/ALT 223/214.  Lipase 5978. Renal function WNL. WBCs 11.  Hb 11.9, MCV 61. CTAP with contrast: Intra and extrahepatic biliary ducts dilated.  CBD 1.5 cm.  Subtle hyper attenuation at the ampulla, ?  Stone versus edema.  Suspicion of mild peripancreatic edema at the head and tail.  Small  hiatal hernia with fluid in the herniated stomach and distal esophagus, S/O dysmotility vs reflux.  Assess in the right lower lobe is stable, C/W intralobar pulmonary sequestration. Abdominal ultrasound: 1.5 cm proximal CBD diameter.  Gallbladder wall prominent, gallbladder stones and sludge.  Liver normal, mild intrahepatic biliary dilatation.  PV Dopplers normal.  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  Previous smoker.  Lives with his wife.  Drinks 2-3 alcoholic beverages per week.    Past Medical History:  Diagnosis Date  . Anemia   . DRESS syndrome   . GERD (gastroesophageal reflux disease)   . Hiatal hernia   . Hypertension   . 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 Toxoplasmosis     Past Surgical History:  Procedure Laterality Date  . CORONARY STENT INTERVENTION N/A 05/18/2020   Procedure: CORONARY STENT INTERVENTION;  Surgeon: Belva Crome, MD;  Location: [facility];  Service: Cardiovascular;  Laterality: N/A;  . CORONARY/GRAFT ACUTE MI REVASCULARIZATION N/A 05/15/2020   Procedure: CORONARY/GRAFT ACUTE MI REVASCULARIZATION;  Surgeon: Daneen Schick  W, MD;  Location: [facility];  Service: Cardiovascular;  Laterality: N/A;  . NO PAST SURGERIES      Prior to Admission medications   Medication Sig Start Date End Date Taking? Authorizing Provider  aspirin EC 81 MG EC tablet Take 1 tablet (81 mg total) by mouth daily. Swallow whole. 05/20/20  Yes Almyra Deforest, PA  atorvastatin (LIPITOR) 80 MG tablet Take 1 tablet (80 mg total) by mouth daily. 05/20/20  Yes Almyra Deforest, PA  clopidogrel (PLAVIX) 75 MG tablet Take 1 tablet (75 mg total) by mouth daily. 08/24/20  Yes Belva Crome, MD  metoprolol succinate (TOPROL-XL) 25 MG 24 hr tablet Take 1 tablet (25 mg total) by mouth daily. 09/25/20  Yes Belva Crome, MD   niacin 500 MG tablet Take 500 mg by mouth daily.   Yes [provider]  pantoprazole (PROTONIX) 40 MG tablet Take 40 mg by mouth daily.   Yes [provider]  sacubitril-valsartan (ENTRESTO) 49-51 MG Take 1 tablet by mouth 2 (two) times daily. 10/14/20  Yes Burtis Junes, NP  nitroGLYCERIN (NITROSTAT) 0.4 MG SL tablet Place 1 tablet (0.4 mg total) under the tongue every 5 (five) minutes x 3 doses as needed for chest pain. 05/19/20   Almyra Deforest, PA    Scheduled Meds:  Infusions:  PRN Meds:    Allergies as of 12/14/2020 - Review Complete 12/14/2020  Allergen Reaction Noted  . Sulfamethoxazole-trimethoprim Anaphylaxis and Rash 12/13/2018    Family History  Problem Relation Age of Onset  . Colon cancer Neg Hx   . Stomach cancer Neg Hx   . Pancreatic cancer Neg Hx   . Esophageal cancer Neg Hx   . Rectal cancer Neg Hx     Social History   Socioeconomic History  . Marital status: Married    Spouse name: Not on file  . Number of children: Not on file  . Years of education: Not on file  . Highest education level: Not on file  Occupational History  . Not on file  Tobacco Use  . Smoking status: Former Smoker    Types: Cigarettes  . Smokeless tobacco: Never Used  Vaping Use  . Vaping Use: Never used  Substance and Sexual Activity  . Alcohol use: Yes    Alcohol/week: 2.0 - 3.0 standard drinks    Types: 2 - 3 Standard drinks or equivalent per week    Comment: Occ   . Drug use: No  . Sexual activity: Not on file  Other Topics Concern  . Not on file  Social History Narrative  . Not on file   Social Determinants of Health   Financial Resource Strain: Not on file  Food Insecurity: Not on file  Transportation Needs: Not on file  Physical Activity: Not on file  Stress: Not on file  Social Connections: Not on file  Intimate Partner Violence: Not on file    REVIEW OF SYSTEMS: Constitutional: Feels tired but generally does not have issues with  fatigue. ENT:  No nose bleeds Pulm: No shortness of breath.  No cough. CV:  No palpitations, no LE edema.  No angina. GU:  No hematuria, no frequency GI: See HPI Heme: Denies excessive or unusual bleeding/bruising. Transfusions: None. Neuro:  No headaches, no peripheral tingling or numbness.  No syncope, no seizures. Derm:  No itching, no rash or sores.  Endocrine:  No sweats or chills.  No polyuria or dysuria Immunization: Vaccinated x3 for COVID-19. Travel:  None beyond local counties in last few months.    PHYSICAL EXAM: Vital signs in last 24 hours: Vitals:   12/14/20 1130 12/14/20 1241  BP: 110/78 114/75  Pulse: 90 89  Resp: (!) 24 20  Temp:    SpO2: 97% 97%   Wt Readings from Last 3 Encounters:  10/14/20 81.6 kg  09/25/20 81 kg  06/26/20 79.5 kg    General: Pleasant, nontoxic, comfortable. Head: No facial asymmetry or swelling.  No signs of head trauma. Eyes: ?  Mild scleral icterus, if present it is very subtle.  No conjunctival pallor. Ears: Not hard of hearing Nose: No congestion or discharge Mouth: Oropharynx moist, pink, clear.  Tongue midline.  Good dentition. Neck: No JVD, no masses, no thyromegaly Lungs: No shortness of breath or cough.  Lungs clear bilaterally. Heart: RRR.  No MRG.  S1, S2 present. Abdomen: Soft.  Not distended.  Active bowel sounds.  Not tender.  No HSM, masses, bruits, hernias.   Rectal: Deferred Musc/Skeltl: No joint redness, swelling or gross deformity. Extremities: No CCE.  Both feet are warm. Neurologic: Alert.  Oriented x3.  Moves all 4 limbs without tremor.  Strength not tested but no gross weakness. Skin: Slight jaundice but it is difficult to discern as he has Asian skin coloration Tattoos: Professional quality tattoos on the limbs. Nodes: No cervical adenopathy Psych: Cooperative, calm, pleasant, fluid speech.  Intake/Output from previous day: No intake/output data recorded. Intake/Output this shift: Total I/O In: 500  [IV Piggyback:500] Out: -   LAB RESULTS: Recent Labs    12/14/20 0901  WBC 11.0*  HGB 11.9*  HCT 39.1  PLT 178   BMET Lab Results  Component Value Date   NA 138 12/14/2020   NA 140 10/14/2020   NA 143 08/18/2020   K 3.6 12/14/2020   K 4.0 10/14/2020   K 4.1 08/18/2020   CL 108 12/14/2020   CL 107 (H) 10/14/2020   CL 108 (H) 08/18/2020   CO2 19 (L) 12/14/2020   CO2 23 10/14/2020   CO2 22 08/18/2020   GLUCOSE 146 (H) 12/14/2020   GLUCOSE 91 10/14/2020   GLUCOSE 128 (H) 08/18/2020   BUN 15 12/14/2020   BUN 17 10/14/2020   BUN 22 08/18/2020   CREATININE 1.03 12/14/2020   CREATININE 1.02 10/14/2020   CREATININE 1.00 08/18/2020   CALCIUM 9.0 12/14/2020   CALCIUM 8.8 10/14/2020   CALCIUM 9.2 08/18/2020   LFT Recent Labs    12/14/20 0901  PROT 6.9  ALBUMIN 3.3*  AST 223*  ALT 214*  ALKPHOS 476*  BILITOT 4.5*  BILIDIR 2.1*   PT/INR Lab Results  Component Value Date   INR 0.9 05/15/2020   Hepatitis Panel No results for input(s): HEPBSAG, HCVAB, HEPAIGM, HEPBIGM in the last 72 hours. C-Diff No components found for: CDIFF Lipase     Component Value Date/Time   LIPASE 5,978 (H) 12/14/2020 0901    Drugs of Abuse  No results found for: LABOPIA, COCAINSCRNUR, LABBENZ, AMPHETMU, THCU, LABBARB   RADIOLOGY STUDIES: DG Chest 2 View  Result Date: 12/14/2020 CLINICAL DATA:  Chest pain EXAM: CHEST - 2 VIEW COMPARISON:  05/13/2020 FINDINGS: No new consolidation or edema. Cystic change at the right lung base on prior CT is not as well reproduced on radiograph. No pleural effusion or pneumothorax. Stable cardiomediastinal contours with normal heart size. Mild loss of height of a lower thoracic vertebral body. IMPRESSION: No acute process in the chest. Age-indeterminate mild lower thoracic vertebral  body compression fracture. Electronically Signed   By: Macy Mis M.D.   On: 12/14/2020 09:33      IMPRESSION:   *    Gallstone pancreatitis  *     Choledocholithiasis.  *   04/2020 cardiac stents x3 at time of MI. Chronic DAPT with Plavix/81 ASA.  Last Plavix was this morning 11/2019  *   Long history of microcytosis without iron deficiency.  Stable mild anemia.  *   11/2018 there, desquamating esophagitis, follow-up EGD 02/2019 entirely normal.    PLAN:     *    Hold Plavix. ? Need for MRCP, may just need to go straight to ERCP after Plavix washout.  *    Okay for clear liquid diet. Meds and lipase in the morning.  *    ?  Add antibiotic.  Note hx severe reaction with dress syndrome secondary to Bactrim.   Azucena Freed  12/14/2020, 12:55 PM Phone [phone]

## 2020-12-14 NOTE — H&P (Signed)
History and Physical    Martin Mcdowell BLT:[id number] DOB: 26-Jan-1951 DOA: 12/14/2020  PCP: Karl Ito, DO Consultants:  Katrinka Blazing - cardiology; Marina Goodell - GILita Mains - ophthalomology Patient coming from:  Home - lives with wife; NOK: Wife, [name] [phone]  Chief Complaint: Abdominal pain  HPI: Martin Mcdowell is a 70 y.o. male with medical history significant of toxoplasma chorioretinitis; HTN; DRESS syndrome; HLD; CAD s/p stent; and chronic systolic CHF presenting with abdominal pain.  He developed acute abdominal pain.  It started about 3 weeks ago.  Pain was just R of his midepigastric region.  It seemed to fluctuate on its own, maybe worse a few hours after eating.  It started last night and was terribly painful, preventing him from sleeping.  No n/v.  Normal BMs.  He seemed extremely weak over the last few days.  No fevers.    ED Course:  Choledocholithiasis with pancreatitis.  Pain overnight, better since morphine.  [place] with stones, biliary dilatation, lipase 5000+ with LFT elevation.  CT confirms.  GI to see, likely MRCP vs. ERCP.  Review of Systems: As per HPI; otherwise review of systems reviewed and negative.   Ambulatory Status:  Ambulates without assistance  COVID Vaccine Status:  Complete polus booster  Past Medical History:  Diagnosis Date  . Anemia   . CAD (coronary artery disease)    s/p stents  . 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/14/2020  . Chronic systolic CHF (congestive heart failure) (HCC)   . DRESS syndrome   . GERD (gastroesophageal reflux disease)   . Hiatal hernia   . Hypertension   . 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 CORONARY STENT INTERVENTION N/A 05/18/2020   Procedure: CORONARY STENT INTERVENTION;  Surgeon: Lyn Records, MD;  Location: [facility] INVASIVE CV  LAB;  Service: Cardiovascular;  Laterality: N/A;  . CORONARY/GRAFT ACUTE MI REVASCULARIZATION N/A 05/15/2020   Procedure: CORONARY/GRAFT ACUTE MI REVASCULARIZATION;  Surgeon: Lyn Records, MD;  Location: [facility] INVASIVE CV LAB;  Service: Cardiovascular;  Laterality: N/A;    Social History   Socioeconomic History  . Marital status: Married    Spouse name: Not on file  . Number of children: Not on file  . Years of education: Not on file  . Highest education level: Not on file  Occupational History  . Not on file  Tobacco Use  . Smoking status: Former Smoker    Packs/day: 2.00    Years: 22.00    Pack years: 44.00    Types: Cigarettes    Quit date: 1990    Years since quitting: 32.1  . Smokeless tobacco: Never Used  Vaping Use  . Vaping Use: Never used  Substance and Sexual Activity  . Alcohol use: Yes    Alcohol/week: 2.0 - 3.0 standard drinks    Types: 2 - 3 Standard drinks or equivalent per week    Comment: Occ   . Drug use: No  . Sexual activity: Not on file  Other Topics Concern  . Not on file  Social History Narrative  . Not on file   Social Determinants of Health   Financial Resource Strain: Not on file  Food Insecurity: Not on file  Transportation Needs: Not on file  Physical Activity: Not on file  Stress: Not on file  Social Connections: Not on file  Intimate Partner Violence: Not on file    Allergies  Allergen Reactions  . Sulfamethoxazole-Trimethoprim Anaphylaxis and Rash    DRESS syndrome requiring hospitalization; 12/11/2018    Family History  Problem Relation Age of Onset  . Colon cancer Neg Hx   . Stomach cancer Neg Hx   . Pancreatic cancer Neg Hx   . Esophageal cancer Neg Hx   . Rectal cancer Neg Hx     Prior to Admission medications   Medication Sig Start Date End Date Taking? Authorizing Provider  aspirin EC 81 MG EC tablet Take 1 tablet (81 mg total) by mouth daily. Swallow whole. 05/20/20  Yes Azalee Course, PA  atorvastatin (LIPITOR) 80 MG  tablet Take 1 tablet (80 mg total) by mouth daily. 05/20/20  Yes Azalee Course, PA  clopidogrel (PLAVIX) 75 MG tablet Take 1 tablet (75 mg total) by mouth daily. 08/24/20  Yes Lyn Records, MD  metoprolol succinate (TOPROL-XL) 25 MG 24 hr tablet Take 1 tablet (25 mg total) by mouth daily. 09/25/20  Yes Lyn Records, MD  niacin 500 MG tablet Take 500 mg by mouth daily.   Yes [provider]  pantoprazole (PROTONIX) 40 MG tablet Take 40 mg by mouth daily.   Yes [provider]  sacubitril-valsartan (ENTRESTO) 49-51 MG Take 1 tablet by mouth 2 (two) times daily. 10/14/20  Yes Rosalio Macadamia, NP  nitroGLYCERIN (NITROSTAT) 0.4 MG SL tablet Place 1 tablet (0.4 mg total) under the tongue every 5 (five) minutes x 3 doses as needed for chest pain. 05/19/20   Azalee Course, PA    Physical Exam: Vitals:   12/14/20 1130 12/14/20 1241 12/14/20 1300 12/14/20 1330  BP: 110/78 114/75 114/71 106/74  Pulse: 90 89 91 81  Resp: (!) 24 20 20 18   Temp:      TempSrc:      SpO2: 97% 97% 97% 97%     . General:  Appears calm and comfortable and is in NAD . Eyes:  PERRL, EOMI, normal lids, iris . ENT:  grossly normal hearing, lips & tongue, mmm; appropriate dentition . Neck:  no LAD, masses or thyromegaly . Cardiovascular:  RRR, no m/r/g. No LE edema.  Respiratory:   CTA bilaterally with no wheezes/rales/rhonchi.  Normal respiratory effort. . Abdomen:  soft, NT, ND, NABS . Skin:  no rash or induration seen on limited exam . Musculoskeletal:  grossly normal tone BUE/BLE, good ROM, no bony abnormality . Psychiatric:  grossly normal mood and affect, speech fluent and appropriate, AOx3 . Neurologic:  CN 2-12 grossly intact, moves all extremities in coordinated fashion    Radiological Exams on Admission: Independently reviewed - see discussion in A/P where applicable  DG Chest 2 View  Result Date: 12/14/2020 CLINICAL DATA:  Chest pain EXAM: CHEST - 2 VIEW COMPARISON:  05/13/2020 FINDINGS: No  new consolidation or edema. Cystic change at the right lung base on prior CT is not as well reproduced on radiograph. No pleural effusion or pneumothorax. Stable cardiomediastinal contours with normal heart size. Mild loss of height of a lower thoracic vertebral body. IMPRESSION: No acute process in the chest. Age-indeterminate mild lower thoracic vertebral body compression fracture. Electronically Signed   By: 05/15/2020 M.D.   On: 12/14/2020 09:33   CT Abdomen Pelvis W Contrast  Result Date: 12/14/2020 CLINICAL DATA:  Epigastric pain for 2 weeks. Concern for choledocholithiasis on ultrasound. EXAM: CT ABDOMEN AND PELVIS WITH CONTRAST TECHNIQUE:  Multidetector CT imaging of the abdomen and pelvis was performed using the standard protocol following bolus administration of intravenous contrast. CONTRAST:  100mL OMNIPAQUE IOHEXOL 300 MG/ML  SOLN COMPARISON:  Ultrasound of earlier today.  CTA chest 03/15/2019. FINDINGS: Lower chest: Similar appearance of right lower lobe consolidation with bronchiectasis including on 11/05. Arterial supply off the upper abdominal aorta is more apparent on the prior CTA. Multivessel coronary artery atherosclerosis. Small hiatal hernia with fluid in the herniated stomach including on 10/03. Distal esophageal fluid as well. Hepatobiliary: Normal liver.  No calcified gallstones. Mild intrahepatic biliary duct dilatation. Moderate common duct dilatation, including at 1.5 cm on 81/7. This is followed to the level of the ampulla. Subtle hyperattenuation in this area including at 6 mm on 39/3. Pancreas: No pancreatic duct dilatation. Suspect mild peripancreatic edema, including adjacent the tail on 26/3 and the head on 31/3. Spleen: Normal in size, without focal abnormality. Adrenals/Urinary Tract: Normal adrenal glands. Normal kidneys, without hydronephrosis. Normal urinary bladder. Stomach/Bowel: Gastric body underdistention. Normal colon and terminal ileum. Normal small bowel.  Vascular/Lymphatic: Aortic atherosclerosis. No abdominopelvic adenopathy. Reproductive: Normal prostate. Trace right scrotal fluid, likely physiologic. Other: No significant free fluid. Musculoskeletal: No acute osseous abnormality. IMPRESSION: 1. Intra and extrahepatic biliary duct dilatation with subtle hyperattenuation at the level of the ampulla. Equivocal for 6 mm stone versus ampullary edema. Consider further evaluation with ERCP. If preprocedure differentiation is desired, MRCP may be informative. 2. Suspicion of mild pancreatitis, lipase pending. 3. Similar right lower lobe process compared to 03/15/2019, most consistent with pulmonary sequestration, favor intralobar type. 4. Small hiatal hernia with fluid in the herniated stomach and distal esophagus, suggesting dysmotility and/or gastroesophageal reflux. 5.  Aortic Atherosclerosis (ICD10-I70.0). Electronically Signed   By: Jeronimo GreavesKyle  Talbot M.D.   On: 12/14/2020 12:22   US Abdomen Limited RUQ (LIVER/GB)  Result Date: 12/14/2020 CLINICAL DATA:  Right upper quadrant pain, jaundice. EXAM: ULTRASOUND ABDOMEN LIMITED RIGHT UPPER QUADRANT COMPARISON:  03/15/2019. FINDINGS: Gallbladder: Prominence of the gallbladder wall measuring up to 3.7 mm. Intraluminal calculi measuring up to 1.2 cm with sludge. Sonographic Eulah PontMurphy sign could not be assessed secondary to premedication. Common bile duct: Diameter: 15.4 mm proximally. Overlying bowel gas limits visualization of the distal CBD. Liver: No focal lesion identified. Within normal limits in parenchymal echogenicity. Mild intrahepatic biliary dilatation. Portal vein is patent on color Doppler imaging with normal direction of blood flow towards the liver. Other: None. IMPRESSION: Cholelithiasis and intrahepatic/extrahepatic biliary dilatation is suspicious for choledocholithiasis. Cholecystitis is lower on the differential given lack of wall edema/pericholecystic free fluid. Sonographic Eulah PontMurphy sign could not be assessed  secondary to premedication. Electronically Signed   By: Stana Buntinghikanele  Emekauwa M.D.   On: 12/14/2020 10:58    EKG: Independently reviewed.  NSR with rate 90; no evidence of acute ischemia   Labs on Admission: I have personally reviewed the available labs and imaging studies at the time of the admission.  Pertinent labs:   CO2 19 Glucose 146 AP 476 AST 223/ALT 214/Bili 4.5 Lipase 5978 HS troponin 16, 14 WBC 11.0 Hgb 11.9 Lactate 1.3 COVID/flu negative   Assessment/Plan Principal Problem:   Choledocholithiasis with acute cholecystitis Active Problems:   DRESS syndrome   Toxoplasmosis   CAD (coronary artery disease), native coronary artery   Hyperlipidemia LDL goal <70   Essential hypertension   Chronic combined systolic (congestive) and diastolic (congestive) heart failure (HCC)   Choledocholithiasis with pancreatitis -Patient with several months of abdominal pain, possibly worse after eating -Developed  acute onset of pain last night that was refractory -Significantly elevated LFTs and lipase on labs today -[place] with cholelithiasis and biliary dilatation suggestive of choledocholithiasis -CT with 6 mm ampullary stone vs. Ampullary edema with mild pancreatitis -Patient will need ERCP (MRCP first?) and then cholecystectomy (with cholangiogram?) during this hospitalization -Clear liquids while awaiting Plavix washout (last dose was this AM, so maybe 2/23-24? -Morphine for pain, Zofran for nausea -GI and surgery consults requested -Empiric coverage with Zosyn for now given severity of symptoms/LFT elevation, leukocytosis, elderly male  CAD -s/p 3 stents -Continue ASA -Hold Plavix pending surgery  HTN -Continue Toprol XL and Entresto  HLD -Continue Lipitor  Chronic combined CHF -08/10/20 echo with EF 35-40% with grade 2 diastolic dysfunction -Needs to be monitored for evidence of volume overload -Continue BB and Entresto -For now, only taking clear liquids and will run  IVF infusion given severity of gallbladder disease and presence of mild pancreatitis  Toxoplasma chorioretinitis -Parasite is no longer present but he has residual scarring causing a blind spot in his vision  H/o DRESS syndrome -From Bactrim, given for toxo -Improved with prednisone -Last seen by derm at [facility] in 2020   Note: This patient has been tested and is negative for the novel coronavirus COVID-19. The patient has been fully vaccinated against COVID-19.   Level of care: Med-Surg DVT prophylaxis:  SCDs Code Status:  DNR - confirmed with patient/family Family Communication: Wife was present throughout evaluation Disposition Plan:  The patient is from: home  Anticipated d/c is to: home without [facility] services   Anticipated d/c date will depend on clinical response to treatment,likely later this week after ERCP -> cholecystectomy  Patient is currently: acutely ill Consults called: GI; surgery Admission status:  Admit - It is my clinical opinion that admission to INPATIENT is reasonable and necessary because of the expectation that this patient will require hospital care that crosses at least 2 midnights to treat this condition based on the medical complexity of the problems presented.  Given the aforementioned information, the predictability of an adverse outcome is felt to be significant.    Jonah Blue MD [facility]   How to contact the [facility] Attending or Consulting provider 7A - 7P or covering provider during after hours 7P -7A, for this patient?  1. Check the care team in [facility] and look for a) attending/consulting [facility] provider listed and b) the [facility] team listed 2. Log into [email or web address] and use [facility] universal password to access. If you do not have the password, please contact the hospital operator. 3. Locate the [facility] provider you are looking for under [facility] and page to a number that you can be directly reached. 4. If you still have difficulty reaching  the provider, please page the [facility] (Director on Call) for the Hospitalists listed on amion for assistance.   12/14/2020, 2:28 PM

## 2020-12-14 NOTE — Consult Note (Signed)
[facility] Surgery Consult Note  Martin Mcdowell 1951-04-25  [id number].    Requesting MD: Karmen Bongo Chief Complaint/Reason for Consult: choledocholithiasis  HPI:  Martin Mcdowell is a 70yo male PMH CAD/ hx MI 04/2020 requiring stent placement on aspirin and plavix (last dose 2/21 at 0800), CHF (EF 35-40% from ECHO 07/2020), GERD, HTN, HLD who presented to [facility] earlier today complaining of epigastric abdominal pain. States that the pain started about 2-3 weeks ago while he was at work. Initially it was intermittent and would resolve without intervention.  This time the pain started around 0100. It woke him from sleep and was more severe, constant. The pain does not radiate from his epigastrium. Took gasX and pepto bismol without relief. Denies similar pain prior to 2-3 weeks ago. He denies fevers, chills, nausea, vomiting, diarrhea, or any other associated symptoms. Last bowel movement was yesterday and normal.   In the ED his vital signs were found to be WNL. Lab work significant for WBC 11, lactic acid WNL. Elevated LFTs with Ast 223, ALT 214, Alk phos 476, Tbili 4.5. Lipase also elevated at 5,978. Troponins negative. CT scan reports intra and extrahepatic biliary duct dilatation with subtle hyperattenuation at the level of the ampulla equivocal for 6 mm stone versus ampullary edema; suspicion of mild pancreatitis. Abdominal u/s shows cholelithiasis and intrahepatic/extrahepatic biliary dilatation suspicious for choledocholithiasis; no gallbladder wall edema or pericholecystic free fluid. Patient is being admitted to the medical service. Gastroenterology and general surgery asked to consult.  Abdominal surgical history: none Former smoker, quit in [place] alcohol occasionally - states he has a beer every 2-3 days. Employment: works Hotel manager  Allergies: bactrim (states it caused him hospitalization due to DRESS syndrome when he received it for treatment of  toxoplasmosis)  Review of Systems  Gastrointestinal: Positive for abdominal pain.  All other systems reviewed and are negative.  All systems reviewed and otherwise negative except for as above  Family History  Problem Relation Age of Onset  . Colon cancer Neg Hx   . Stomach cancer Neg Hx   . Pancreatic cancer Neg Hx   . Esophageal cancer Neg Hx   . Rectal cancer Neg Hx     Past Medical History:  Diagnosis Date  . Anemia   . CAD (coronary artery disease)    s/p stents  . Chronic systolic CHF (congestive heart failure) ([place])   . DRESS syndrome   . GERD (gastroesophageal reflux disease)   . Hiatal hernia   . Hypertension   . 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 CORONARY STENT INTERVENTION N/A 05/18/2020   Procedure: CORONARY STENT INTERVENTION;  Surgeon: Belva Crome, MD;  Location: [place] [facility];  Service: Cardiovascular;  Laterality: N/A;  . CORONARY/GRAFT ACUTE MI REVASCULARIZATION N/A 05/15/2020   Procedure: CORONARY/GRAFT ACUTE MI REVASCULARIZATION;  Surgeon: Belva Crome, MD;  Location: [place] [facility];  Service: Cardiovascular;  Laterality: N/A;    Social History:  reports that he quit smoking about 32 years ago. His smoking use included cigarettes. He has a 44.00 pack-year smoking history. He has never used smokeless tobacco. He reports current alcohol use of about 2.0 - 3.0 standard drinks of alcohol per week. He reports that he does not use drugs.  Allergies:  Allergies  Allergen Reactions  .  Sulfamethoxazole-Trimethoprim Anaphylaxis and Rash    DRESS syndrome requiring hospitalization; 12/11/2018    (Not in a hospital admission)   Prior to Admission medications   Medication Sig Start Date End Date Taking? Authorizing Provider  aspirin EC 81 MG EC tablet Take 1 tablet (81 mg total) by mouth daily.  Swallow whole. 05/20/20  Yes Almyra Deforest, PA  atorvastatin (LIPITOR) 80 MG tablet Take 1 tablet (80 mg total) by mouth daily. 05/20/20  Yes Almyra Deforest, PA  clopidogrel (PLAVIX) 75 MG tablet Take 1 tablet (75 mg total) by mouth daily. 08/24/20  Yes Belva Crome, MD  metoprolol succinate (TOPROL-XL) 25 MG 24 hr tablet Take 1 tablet (25 mg total) by mouth daily. 09/25/20  Yes Belva Crome, MD  niacin 500 MG tablet Take 500 mg by mouth daily.   Yes [provider]  pantoprazole (PROTONIX) 40 MG tablet Take 40 mg by mouth daily.   Yes [provider]  sacubitril-valsartan (ENTRESTO) 49-51 MG Take 1 tablet by mouth 2 (two) times daily. 10/14/20  Yes Burtis Junes, NP  nitroGLYCERIN (NITROSTAT) 0.4 MG SL tablet Place 1 tablet (0.4 mg total) under the tongue every 5 (five) minutes x 3 doses as needed for chest pain. 05/19/20   Almyra Deforest, PA    Blood pressure 106/74, pulse 81, temperature 99.1 F (37.3 C), temperature source Oral, resp. rate 18, SpO2 97 %.  Physical Exam: General: pleasant, WD/WN male who is laying in bed in NAD HEENT: head is normocephalic, atraumatic.  Sclera are noninjected.  PERRL.  Ears and nose without any masses or lesions.  Mouth is pink and moist. Dentition fair Heart: regular, rate, and rhythm.  Normal s1,s2. No obvious murmurs, gallops, or rubs noted.  Palpable pedal pulses bilaterally  Lungs: CTAB, no wheezes, rhonchi, or rales noted.  Respiratory effort nonlabored Abd: soft, NT/ND (does report recently receiving morphine), +BS, no masses, hernias, or organomegaly MS: no BUE/BLE edema, calves soft and nontender Skin: warm and dry with no masses, lesions, or rashes Psych: A&Ox4 with an appropriate affect Neuro: cranial nerves grossly intact, equal strength in BUE/BLE bilaterally, normal speech, thought process intact  Results for orders placed or performed during the hospital encounter of 12/14/20 (from the past 48 hour(s))  CBC     Status: Abnormal    Collection Time: 12/14/20  9:01 AM  Result Value Ref Range   WBC 11.0 (H) 4.0 - 10.5 K/uL   RBC 6.36 (H) 4.22 - 5.81 MIL/uL   Hemoglobin 11.9 (L) 13.0 - 17.0 g/dL   HCT 39.1 39.0 - 52.0 %   MCV 61.5 (L) 80.0 - 100.0 fL   MCH 18.7 (L) 26.0 - 34.0 pg   MCHC 30.4 30.0 - 36.0 g/dL   RDW 17.9 (H) 11.5 - 15.5 %   Platelets 178 150 - 400 K/uL    Comment: REPEATED TO VERIFY   nRBC 0.0 0.0 - 0.2 %    Comment: Performed at [facility], [place] [address] [place], [place] [address]  Comprehensive metabolic panel     Status: Abnormal   Collection Time: 12/14/20  9:01 AM  Result Value Ref Range   Sodium 138 135 - 145 mmol/L   Potassium 3.6 3.5 - 5.1 mmol/L   Chloride 108 98 - 111 mmol/L   CO2 19 (L) 22 - 32 mmol/L   Glucose, Bld 146 (H) 70 - 99 mg/dL    Comment: Glucose reference range applies only to samples taken after fasting  for at least 8 hours.   BUN 15 8 - 23 mg/dL   Creatinine, Ser 1.03 0.61 - 1.24 mg/dL   Calcium 9.0 8.9 - 10.3 mg/dL   Total Protein 6.9 6.5 - 8.1 g/dL   Albumin 3.3 (L) 3.5 - 5.0 g/dL   AST 223 (H) 15 - 41 U/L   ALT 214 (H) 0 - 44 U/L   Alkaline Phosphatase 476 (H) 38 - 126 U/L   Total Bilirubin 4.5 (H) 0.3 - 1.2 mg/dL   GFR, Estimated >60 >60 mL/min    Comment: (NOTE) Calculated using the CKD-EPI Creatinine Equation (2021)    Anion gap 11 5 - 15    Comment: Performed at [facility], [place] [address] [place], [place] [address]  Bilirubin, direct     Status: Abnormal   Collection Time: 12/14/20  9:01 AM  Result Value Ref Range   Bilirubin, Direct 2.1 (H) 0.0 - 0.2 mg/dL    Comment: Performed at [facility] [address], [place], [place] [address]  Lipase, blood     Status: Abnormal   Collection Time: 12/14/20  9:01 AM  Result Value Ref Range   Lipase 5,978 (H) 11 - 51 U/L    Comment: RESULTS CONFIRMED BY MANUAL DILUTION Performed at [facility], [place] [address], [place], [place] [address]   Troponin I (High Sensitivity)      Status: None   Collection Time: 12/14/20  9:01 AM  Result Value Ref Range   Troponin I (High Sensitivity) 16 <18 ng/L    Comment: (NOTE) Elevated high sensitivity troponin I (hsTnI) values and significant  changes across serial measurements may suggest ACS but many other  chronic and acute conditions are known to elevate hsTnI results.  Refer to the Links section for chest pain algorithms and additional  guidance. Performed at [facility], [place] [address] [place], [place] [address]   Troponin I (High Sensitivity)     Status: None   Collection Time: 12/14/20 11:16 AM  Result Value Ref Range   Troponin I (High Sensitivity) 14 <18 ng/L    Comment: (NOTE) Elevated high sensitivity troponin I (hsTnI) values and significant  changes across serial measurements may suggest ACS but many other  chronic and acute conditions are known to elevate hsTnI results.  Refer to the "Links" section for chest pain algorithms and additional  guidance. Performed at [facility], [place] [address] [place], [place] [address]   Resp Panel by RT-PCR (Flu A&B, Covid) Nasopharyngeal Swab     Status: None   Collection Time: 12/14/20 11:16 AM   Specimen: Nasopharyngeal Swab; Nasopharyngeal(NP) swabs in vial transport medium  Result Value Ref Range   SARS Coronavirus 2 by RT PCR NEGATIVE NEGATIVE    Comment: (NOTE) SARS-CoV-2 target nucleic acids are NOT DETECTED.  The SARS-CoV-2 RNA is generally detectable in upper respiratory specimens during the acute phase of infection. The lowest concentration of SARS-CoV-2 viral copies this assay can detect is 138 copies/mL. A negative result does not preclude SARS-Cov-2 infection and should not be used as the sole basis for treatment or other patient management decisions. A negative result may occur with  improper specimen collection/handling, submission of specimen other than nasopharyngeal swab, presence of viral mutation(s) within the areas targeted by  this assay, and inadequate number of viral copies(<138 copies/mL). A negative result must be combined with clinical observations, patient history, and epidemiological information. The expected result is Negative.  Fact Sheet for Patients:  EntrepreneurPulse.com.au  Fact Sheet for Healthcare Providers:  IncredibleEmployment.be  This test is no t yet approved or cleared by the [place] FDA and  has been authorized for detection and/or diagnosis of SARS-CoV-2 by FDA under an Emergency Use Authorization (EUA). This EUA will remain  in effect (meaning this test can be used) for the duration of the COVID-19 declaration under Section 564(b)(1) of the Act, 21 U.S.C.section 360bbb-3(b)(1), unless the authorization is terminated  or revoked sooner.       Influenza A by PCR NEGATIVE NEGATIVE   Influenza B by PCR NEGATIVE NEGATIVE    Comment: (NOTE) The Xpert Xpress SARS-CoV-2/FLU/RSV plus assay is intended as an aid in the diagnosis of influenza from Nasopharyngeal swab specimens and should not be used as a sole basis for treatment. Nasal washings and aspirates are unacceptable for Xpert Xpress SARS-CoV-2/FLU/RSV testing.  Fact Sheet for Patients: EntrepreneurPulse.com.au  Fact Sheet for Healthcare Providers: IncredibleEmployment.be  This test is not yet approved or cleared by the [place] FDA and has been authorized for detection and/or diagnosis of SARS-CoV-2 by FDA under an Emergency Use Authorization (EUA). This EUA will remain in effect (meaning this test can be used) for the duration of the COVID-19 declaration under Section 564(b)(1) of the Act, 21 U.S.C. section 360bbb-3(b)(1), unless the authorization is terminated or revoked.  Performed at [facility], [place] [address] [place], [place] [address]   Lactic acid, plasma     Status: None   Collection Time: 12/14/20 11:44 AM  Result Value  Ref Range   Lactic Acid, Venous 1.3 0.5 - 1.9 mmol/L    Comment: Performed at [facility] [address], [place], [place] [address]   DG Chest 2 View  Result Date: 12/14/2020 CLINICAL DATA:  Chest pain EXAM: CHEST - 2 VIEW COMPARISON:  05/13/2020 FINDINGS: No new consolidation or edema. Cystic change at the right lung base on prior CT is not as well reproduced on radiograph. No pleural effusion or pneumothorax. Stable cardiomediastinal contours with normal heart size. Mild loss of height of a lower thoracic vertebral body. IMPRESSION: No acute process in the chest. Age-indeterminate mild lower thoracic vertebral body compression fracture. Electronically Signed   By: Macy Mis M.D.   On: 12/14/2020 09:33   CT Abdomen Pelvis W Contrast  Result Date: 12/14/2020 CLINICAL DATA:  Epigastric pain for 2 weeks. Concern for choledocholithiasis on ultrasound. EXAM: CT ABDOMEN AND PELVIS WITH CONTRAST TECHNIQUE: Multidetector CT imaging of the abdomen and pelvis was performed using the standard protocol following bolus administration of intravenous contrast. CONTRAST:  129m OMNIPAQUE IOHEXOL 300 MG/ML  SOLN COMPARISON:  Ultrasound of earlier today.  CTA chest 03/15/2019. FINDINGS: Lower chest: Similar appearance of right lower lobe consolidation with bronchiectasis including on 11/05. Arterial supply off the upper abdominal aorta is more apparent on the prior CTA. Multivessel coronary artery atherosclerosis. Small hiatal hernia with fluid in the herniated stomach including on 10/03. Distal esophageal fluid as well. Hepatobiliary: Normal liver.  No calcified gallstones. Mild intrahepatic biliary duct dilatation. Moderate common duct dilatation, including at 1.5 cm on 81/7. This is followed to the level of the ampulla. Subtle hyperattenuation in this area including at 6 mm on 39/3. Pancreas: No pancreatic duct dilatation. Suspect mild peripancreatic edema, including adjacent the tail on 26/3 and the head  on 31/3. Spleen: Normal in size, without focal abnormality. Adrenals/Urinary Tract: Normal adrenal glands. Normal kidneys, without hydronephrosis. Normal urinary bladder. Stomach/Bowel:  Gastric body underdistention. Normal colon and terminal ileum. Normal small bowel. Vascular/Lymphatic: Aortic atherosclerosis. No abdominopelvic adenopathy. Reproductive: Normal prostate. Trace right scrotal fluid, likely physiologic. Other: No significant free fluid. Musculoskeletal: No acute osseous abnormality. IMPRESSION: 1. Intra and extrahepatic biliary duct dilatation with subtle hyperattenuation at the level of the ampulla. Equivocal for 6 mm stone versus ampullary edema. Consider further evaluation with ERCP. If preprocedure differentiation is desired, MRCP may be informative. 2. Suspicion of mild pancreatitis, lipase pending. 3. Similar right lower lobe process compared to 03/15/2019, most consistent with pulmonary sequestration, favor intralobar type. 4. Small hiatal hernia with fluid in the herniated stomach and distal esophagus, suggesting dysmotility and/or gastroesophageal reflux. 5.  Aortic Atherosclerosis (ICD10-I70.0). Electronically Signed   By: Abigail Miyamoto M.D.   On: 12/14/2020 12:22   US Abdomen Limited RUQ (LIVER/GB)  Result Date: 12/14/2020 CLINICAL DATA:  Right upper quadrant pain, jaundice. EXAM: ULTRASOUND ABDOMEN LIMITED RIGHT UPPER QUADRANT COMPARISON:  03/15/2019. FINDINGS: Gallbladder: Prominence of the gallbladder wall measuring up to 3.7 mm. Intraluminal calculi measuring up to 1.2 cm with sludge. Sonographic Percell Miller sign could not be assessed secondary to premedication. Common bile duct: Diameter: 15.4 mm proximally. Overlying bowel gas limits visualization of the distal CBD. Liver: No focal lesion identified. Within normal limits in parenchymal echogenicity. Mild intrahepatic biliary dilatation. Portal vein is patent on color Doppler imaging with normal direction of blood flow towards the liver.  Other: None. IMPRESSION: Cholelithiasis and intrahepatic/extrahepatic biliary dilatation is suspicious for choledocholithiasis. Cholecystitis is lower on the differential given lack of wall edema/pericholecystic free fluid. Sonographic Percell Miller sign could not be assessed secondary to premedication. Electronically Signed   By: Primitivo Gauze M.D.   On: 12/14/2020 10:58   Anti-infectives (From admission, onward)   Start     Dose/Rate Route Frequency Ordered Stop   12/14/20 1430  piperacillin-tazobactam (ZOSYN) IVPB 3.375 g        3.375 g 12.5 mL/hr over 240 Minutes Intravenous Every 8 hours 12/14/20 1419          Assessment/Plan CAD/ hx MI 04/2020 requiring stent placement on aspirin and plavix (last dose 12/14/20 at 0800) CHF (EF 35-40% from ECHO 07/2020) - will discuss need for perioperative cardiac eval with MD GERD HTN HLD Hx DRESS syndrome from bactrim  Choledocholithiasis with pancreatitis - Patient with choledocholithiasis and gallstone pancreatitis. No signs of cholecystitis. Management of pancreatitis per GI/primary with IVF hydration. GI planning for ERCP once plavix washes out. We will follow to discuss timing of cholecystectomy once pancreatitis and choledocholithiasis are resolved. Trend LFTs. Continue holding plavix- this will need to be held for 5 days prior to surgical intervention.    ID - zosyn VTE - SCDs, ok for chemical DVT prophylaxis from surgical standpoint FEN - IVF, CLD Foley - none Follow up - TBD  Plan - admit to hospital for management of gallstone pancreatitis, would benefit from laparoscopic cholecystectomy once pancreatitis resolves. [facility] will follow   Obie Dredge, [facility] Surgery 12/14/2020, 2:15 PM Please see Amion for pager number during day hours 7:00am-4:30pm

## 2020-12-14 NOTE — ED Provider Notes (Signed)
  Face-to-face evaluation   History: He presents for evaluation of upper abdominal pain, which started 3 AM and has persisted until he received medications here.  He has intermittent pain similar to this at varying times during the day, usually 6 to 7 hours after eating over the last 2 to 3 weeks.  He is currently taking a PPI for "esophageal trouble."  He denies diarrhea or constipation, currently.  He denies chest pain, shortness of breath, fever or chills.  Physical exam: Alert elderly male who is comfortable while supine.  Abdomen is soft and nontender to palpation.  No palpable abdominal mass.  No respiratory distress.  No dysarthria or aphasia.  Medical screening examination/treatment/procedure(s) were conducted as a shared visit with non-physician practitioner(s) and myself.  I personally evaluated the patient during the encounter    Mancel Bale, MD 12/16/20 [phone]

## 2020-12-14 NOTE — Telephone Encounter (Signed)
Pt's wife called to inform that pt has been having severe acid reflux lately. She just scheduled an appt for pt to see Dr. Marina Goodell this Wednesday 2/23 but she would like some advise on what to do in the meantime.

## 2020-12-14 NOTE — ED Triage Notes (Signed)
Pt arrives to ED with chief complaint of epigastric pain that began 2 weeks ago that has been coming and goes but at 1am it came on very strong and has not resolved. He denies any n/v/d. Hx of MI, with x3 stent placement in June 2021

## 2020-12-14 NOTE — ED Provider Notes (Signed)
[facility] EMERGENCY DEPARTMENT Provider Note   CSN: [id number] Arrival date & time: 12/14/20  8466     History Chief Complaint  Patient presents with  . Abdominal Pain    Martin Mcdowell is a 70 y.o. male with PMHx HTN, hiatal hernia, GERD, anemia, CAD s/p STEMI with 3 stents who presents to the ED today with complaint of gradual onset, constant, intermittently achy/stabbing, epigastric abdominal pain that began around 1 AM this morning. Pt reports the pain woke him up out of his sleep. He states he has been dealing with this pain intermittently for the past 2-3 weeks however it will resolve on its own and he did not think much of it. He has not taken anything specifically for this pain besides his regular medications including Protonix. Pt took all of his regular medications this morning without relief of his symptoms. No previous PSHx to abdomen. Reports he had an endoscopy a couple of years ago however cannot remember results (see below). Pt denies any radiation into his chest. He does not think this feels like his previous MI symptoms. Denies fevers, chills, nausea, vomiting, diarrhea, or any other associated symptoms. No hx of heavy NSAID or EtOH use. Last normal bowel movement yesterday.   EGD 05/20 Impression:      - Normal esophagus.                           - Normal stomach.                           - Normal examined duodenum.                           - No specimens collected.  The history is provided by the patient, medical records and the spouse.       Past Medical History:  Diagnosis Date  . Anemia   . CAD (coronary artery disease)    s/p stents  . Chronic systolic CHF (congestive heart failure) ([place])   . DRESS syndrome   . GERD (gastroesophageal reflux disease)   . Hiatal hernia   . Hypertension   . 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 Choledocholithiasis with acute cholecystitis 12/14/2020  . Hyperlipidemia LDL goal <70 05/19/2020  . CAD (coronary artery disease), native coronary artery 05/18/2020  . STEMI (ST elevation myocardial infarction) ([place]) 05/15/2020  . Acute ST elevation myocardial infarction (STEMI) involving left anterior descending (LAD) coronary artery ([place])   . Acute esophagitis 03/06/2019  . DRESS syndrome 12/23/2018  . Anemia 12/23/2018  . GERD (gastroesophageal reflux disease) 12/23/2018  . Toxoplasmosis 12/23/2018  . Epigastric pain 09/18/2018  . Decreased libido 09/18/2018  . Fatigue 08/07/2018  . Cough 08/07/2018  . Screening for colon cancer 08/07/2018    Past Surgical History:  Procedure Laterality Date  . CORONARY STENT INTERVENTION N/A 05/18/2020   Procedure: CORONARY STENT INTERVENTION;  Surgeon: Belva Crome, MD;  Location: [place] [facility];  Service: Cardiovascular;  Laterality: N/A;  . CORONARY/GRAFT ACUTE MI REVASCULARIZATION N/A 05/15/2020   Procedure: CORONARY/GRAFT ACUTE MI REVASCULARIZATION;  Surgeon: Belva Crome, MD;  Location: [place] [facility];  Service: Cardiovascular;  Laterality:  N/A;       Family History  Problem Relation Age of Onset  . Colon cancer Neg Hx   . Stomach cancer Neg Hx   . Pancreatic cancer Neg Hx   . Esophageal cancer Neg Hx   . Rectal cancer Neg Hx     Social History   Tobacco Use  . Smoking status: Former Smoker    Packs/day: 2.00    Years: 22.00    Pack years: 44.00    Types: Cigarettes    Quit date: 1990    Years since quitting: 32.1  . Smokeless tobacco: Never Used  Vaping Use  . Vaping Use: Never used  Substance Use Topics  . Alcohol use: Yes    Alcohol/week: 2.0 - 3.0 standard drinks    Types: 2 - 3 Standard drinks or equivalent per week    Comment: Occ   . Drug use: No    Home Medications Prior to Admission medications   Medication Sig Start Date End Date Taking? Authorizing  Provider  aspirin EC 81 MG EC tablet Take 1 tablet (81 mg total) by mouth daily. Swallow whole. 05/20/20  Yes Almyra Deforest, PA  atorvastatin (LIPITOR) 80 MG tablet Take 1 tablet (80 mg total) by mouth daily. 05/20/20  Yes Almyra Deforest, PA  clopidogrel (PLAVIX) 75 MG tablet Take 1 tablet (75 mg total) by mouth daily. 08/24/20  Yes Belva Crome, MD  metoprolol succinate (TOPROL-XL) 25 MG 24 hr tablet Take 1 tablet (25 mg total) by mouth daily. 09/25/20  Yes Belva Crome, MD  niacin 500 MG tablet Take 500 mg by mouth daily.   Yes [provider]  pantoprazole (PROTONIX) 40 MG tablet Take 40 mg by mouth daily.   Yes [provider]  sacubitril-valsartan (ENTRESTO) 49-51 MG Take 1 tablet by mouth 2 (two) times daily. 10/14/20  Yes Burtis Junes, NP  nitroGLYCERIN (NITROSTAT) 0.4 MG SL tablet Place 1 tablet (0.4 mg total) under the tongue every 5 (five) minutes x 3 doses as needed for chest pain. 05/19/20   Almyra Deforest, PA    Allergies    Sulfamethoxazole-trimethoprim  Review of Systems   Review of Systems  Constitutional: Negative for chills and fever.  Respiratory: Negative for shortness of breath.   Cardiovascular: Negative for chest pain.  Gastrointestinal: Positive for abdominal pain. Negative for diarrhea, nausea and vomiting.  All other systems reviewed and are negative.   Physical Exam Updated Vital Signs BP 116/72 (BP Location: Left Arm)   Pulse 91   Temp 99.1 F (37.3 C) (Oral)   Resp 18   SpO2 96%   Physical Exam Vitals and nursing note reviewed.  Constitutional:      Appearance: He is not ill-appearing or diaphoretic.  HENT:     Head: Normocephalic and atraumatic.  Eyes:     Conjunctiva/sclera: Conjunctivae normal.  Cardiovascular:     Rate and Rhythm: Normal rate and regular rhythm.     Heart sounds: Normal heart sounds.     Comments: 2+ radial pulses bilaterally Pulmonary:     Effort: Pulmonary effort is normal.     Breath sounds: Normal breath  sounds. No wheezing, rhonchi or rales.  Abdominal:     General: Abdomen is flat. Bowel sounds are normal.     Palpations: Abdomen is soft.     Tenderness: There is abdominal tenderness in the right upper quadrant and epigastric area. There is no right CVA tenderness, left CVA tenderness, guarding or rebound.  Positive signs include Murphy's sign.  Musculoskeletal:     Cervical back: Neck supple.  Skin:    General: Skin is warm and dry.  Neurological:     Mental Status: He is alert.     ED Results / Procedures / Treatments   Labs (all labs ordered are listed, but only abnormal results are displayed) Labs Reviewed  CBC - Abnormal; Notable for the following components:      Result Value   WBC 11.0 (*)    RBC 6.36 (*)    Hemoglobin 11.9 (*)    MCV 61.5 (*)    MCH 18.7 (*)    RDW 17.9 (*)    All other components within normal limits  COMPREHENSIVE METABOLIC PANEL - Abnormal; Notable for the following components:   CO2 19 (*)    Glucose, Bld 146 (*)    Albumin 3.3 (*)    AST 223 (*)    ALT 214 (*)    Alkaline Phosphatase 476 (*)    Total Bilirubin 4.5 (*)    All other components within normal limits  BILIRUBIN, DIRECT - Abnormal; Notable for the following components:   Bilirubin, Direct 2.1 (*)    All other components within normal limits  LIPASE, BLOOD - Abnormal; Notable for the following components:   Lipase 5,978 (*)    All other components within normal limits  RESP PANEL BY RT-PCR (FLU A&B, COVID) ARPGX2  LACTIC ACID, PLASMA  TROPONIN I (HIGH SENSITIVITY)  TROPONIN I (HIGH SENSITIVITY)    EKG EKG Interpretation  Date/Time:  Monday December 14 2020 08:56:42 EST Ventricular Rate:  90 PR Interval:  172 QRS Duration: 82 QT Interval:  324 QTC Calculation: 396 R Axis:   -3 Text Interpretation: Normal sinus rhythm Low voltage QRS Septal infarct , age undetermined Abnormal ECG Since last tracing Anterior infarct finding has resolved Otherwise no significant change  Confirmed by Daleen Bo [phone]) on 12/14/2020 9:50:07 AM   Radiology DG Chest 2 View  Result Date: 12/14/2020 CLINICAL DATA:  Chest pain EXAM: CHEST - 2 VIEW COMPARISON:  05/13/2020 FINDINGS: No new consolidation or edema. Cystic change at the right lung base on prior CT is not as well reproduced on radiograph. No pleural effusion or pneumothorax. Stable cardiomediastinal contours with normal heart size. Mild loss of height of a lower thoracic vertebral body. IMPRESSION: No acute process in the chest. Age-indeterminate mild lower thoracic vertebral body compression fracture. Electronically Signed   By: Macy Mis M.D.   On: 12/14/2020 09:33   CT Abdomen Pelvis W Contrast  Result Date: 12/14/2020 CLINICAL DATA:  Epigastric pain for 2 weeks. Concern for choledocholithiasis on ultrasound. EXAM: CT ABDOMEN AND PELVIS WITH CONTRAST TECHNIQUE: Multidetector CT imaging of the abdomen and pelvis was performed using the standard protocol following bolus administration of intravenous contrast. CONTRAST:  198m OMNIPAQUE IOHEXOL 300 MG/ML  SOLN COMPARISON:  Ultrasound of earlier today.  CTA chest 03/15/2019. FINDINGS: Lower chest: Similar appearance of right lower lobe consolidation with bronchiectasis including on 11/05. Arterial supply off the upper abdominal aorta is more apparent on the prior CTA. Multivessel coronary artery atherosclerosis. Small hiatal hernia with fluid in the herniated stomach including on 10/03. Distal esophageal fluid as well. Hepatobiliary: Normal liver.  No calcified gallstones. Mild intrahepatic biliary duct dilatation. Moderate common duct dilatation, including at 1.5 cm on 81/7. This is followed to the level of the ampulla. Subtle hyperattenuation in this area including at 6 mm on 39/3. Pancreas: No pancreatic duct dilatation.  Suspect mild peripancreatic edema, including adjacent the tail on 26/3 and the head on 31/3. Spleen: Normal in size, without focal abnormality.  Adrenals/Urinary Tract: Normal adrenal glands. Normal kidneys, without hydronephrosis. Normal urinary bladder. Stomach/Bowel: Gastric body underdistention. Normal colon and terminal ileum. Normal small bowel. Vascular/Lymphatic: Aortic atherosclerosis. No abdominopelvic adenopathy. Reproductive: Normal prostate. Trace right scrotal fluid, likely physiologic. Other: No significant free fluid. Musculoskeletal: No acute osseous abnormality. IMPRESSION: 1. Intra and extrahepatic biliary duct dilatation with subtle hyperattenuation at the level of the ampulla. Equivocal for 6 mm stone versus ampullary edema. Consider further evaluation with ERCP. If preprocedure differentiation is desired, MRCP may be informative. 2. Suspicion of mild pancreatitis, lipase pending. 3. Similar right lower lobe process compared to 03/15/2019, most consistent with pulmonary sequestration, favor intralobar type. 4. Small hiatal hernia with fluid in the herniated stomach and distal esophagus, suggesting dysmotility and/or gastroesophageal reflux. 5.  Aortic Atherosclerosis (ICD10-I70.0). Electronically Signed   By: Abigail Miyamoto M.D.   On: 12/14/2020 12:22   US Abdomen Limited RUQ (LIVER/GB)  Result Date: 12/14/2020 CLINICAL DATA:  Right upper quadrant pain, jaundice. EXAM: ULTRASOUND ABDOMEN LIMITED RIGHT UPPER QUADRANT COMPARISON:  03/15/2019. FINDINGS: Gallbladder: Prominence of the gallbladder wall measuring up to 3.7 mm. Intraluminal calculi measuring up to 1.2 cm with sludge. Sonographic Percell Miller sign could not be assessed secondary to premedication. Common bile duct: Diameter: 15.4 mm proximally. Overlying bowel gas limits visualization of the distal CBD. Liver: No focal lesion identified. Within normal limits in parenchymal echogenicity. Mild intrahepatic biliary dilatation. Portal vein is patent on color Doppler imaging with normal direction of blood flow towards the liver. Other: None. IMPRESSION: Cholelithiasis and  intrahepatic/extrahepatic biliary dilatation is suspicious for choledocholithiasis. Cholecystitis is lower on the differential given lack of wall edema/pericholecystic free fluid. Sonographic Percell Miller sign could not be assessed secondary to premedication. Electronically Signed   By: Primitivo Gauze M.D.   On: 12/14/2020 10:58    Procedures Procedures   Medications Ordered in ED Medications  morphine 4 MG/ML injection 4 mg (4 mg Intravenous Given 12/14/20 0942)  sodium chloride 0.9 % bolus 500 mL (0 mLs Intravenous Stopped 12/14/20 1115)  iohexol (OMNIPAQUE) 300 MG/ML solution 100 mL (100 mLs Intravenous Contrast Given 12/14/20 1200)    ED Course  I have reviewed the triage vital signs and the nursing notes.  Pertinent labs & imaging results that were available during my care of the patient were reviewed by me and considered in my medical decision making (see chart for details).    MDM Rules/Calculators/A&P                          70 year old male presenting to the ED today with epigastric abdominal pain intermittently x a couple of weeks; constant since 1 AM last night. No other sx including chest pain, SOB, N/V/D. No previous abdominal surgeries to abdomen. On PPI daily; EGD in 2020 without signs of ulcers or other abnormalities. On arrival to the ED VSS. Pt's temp slightly elevated 99.1, nontachycardic and nontachypneic. Appears to be in NAD. On exam pt has epigastric and RUQ TTP with + murphy's sign. Concern for possible cholelithiasis today vs acute cholecystitis given persistent pain and slightly elevated temperature vs PUD vs GERD vs other intraabdominal abnormality. An EKG was obtained while pt was in the waiting room without acute ischemic changes. CXR was clear without signs of pneumonia. Pt is adamant this does not feel like his previous MI  however a troponin was collected prior to being seen - will trend. Will also obtain CBC, BMP, Lipase, LFTs with pain medication as well as RUQ  ultrasound.   CBC with mild leukocytosis 11,000. Will add on lactic acid at this time.  CMP with glucose 146 and bicarb 19; no gap. LFTs significantly elevated. Alk phos 476. AST/ALT 223/214 with T bili 4.5; lab added on direct bili at 2.1  Hepatic Function Latest Ref Rng & Units 12/14/2020 06/25/2020 05/15/2020  Total Protein 6.5 - 8.1 g/dL 6.9 5.8(L) 7.0  Albumin 3.5 - 5.0 g/dL 3.3(L) 3.7(L) 3.9  AST 15 - 41 U/L 223(H) 15 17  ALT 0 - 44 U/L 214(H) 24 50(H)  Alk Phosphatase 38 - 126 U/L 476(H) 121 84  Total Bilirubin 0.3 - 1.2 mg/dL 4.5(H) 1.3(H) 1.3(H)  Bilirubin, Direct 0.0 - 0.2 mg/dL 2.1(H) 0.32 -   Lipase pending - lab reports they are having to dilute the specimen and suspect it to be significantly elevated Troponin 16  Ultrasound interpreted by myself; CBD does appear distended at this time and pt appears to have gallstones. Official read not crossing over however pt with cholelithiasis and intrahepatic/extrahepatic biliary dilatation is suspicious for choledocholithiasis. Will add on CT A/P at this time for further evaluation and await lipase. On reevaluation pt resting comfortably; appears that the sonographer was unable to evaluate for Murphy's sign due to pt being premedicated with morphine. COVID test ordered.   IMPRESSION: Cholelithiasis and intrahepatic/extrahepatic biliary dilatation is suspicious for choledocholithiasis. Cholecystitis is lower on the differential given lack of wall edema/pericholecystic free fluid. Sonographic Percell Miller sign could not be assessed secondary to premedication.  CT scan again not crossing over however: IMPRESSION: 1. Intra and extrahepatic biliary duct dilatation with subtle hyperattenuation at the level of the ampulla. Equivocal for 6 mm stone versus ampullary edema. Consider further evaluation with ERCP. If preprocedure differentiation is desired, MRCP may be informative. 2. Suspicion of mild pancreatitis, lipase pending. 3. Similar right  lower lobe process compared to 03/15/2019, most consistent with pulmonary sequestration, favor intralobar type. 4. Small hiatal hernia with fluid in the herniated stomach and distal esophagus, suggesting dysmotility and/or gastroesophageal reflux. 5. Aortic Atherosclerosis (ICD10-I70.0).  Lipase has returned > 5,000  Discussed case with Azucena Freed, PA-C with [facility] who will come evaluate patient. Recommends medicine admission.   Dr. Lorin Mercy with [facility] to admit; have also discussed with general surgeon Dr. Kieth Brightly who will come evaluate patient.   This note was prepared using Dragon voice recognition software and may include unintentional dictation errors due to the inherent limitations of voice recognition software.  Final Clinical Impression(s) / ED Diagnoses Final diagnoses:  RUQ abdominal pain  Cholelithiasis with choledocholithiasis  Acute biliary pancreatitis without infection or necrosis    Rx / DC Orders ED Discharge Orders    None       Eustaquio Maize, PA-C 12/14/20 1412    Daleen Bo, MD 12/16/20 [phone]

## 2020-12-15 DIAGNOSIS — R1011 Right upper quadrant pain: Secondary | ICD-10-CM

## 2020-12-15 DIAGNOSIS — K8070 Calculus of gallbladder and bile duct without cholecystitis without obstruction: Secondary | ICD-10-CM | POA: Diagnosis not present

## 2020-12-15 DIAGNOSIS — K8042 Calculus of bile duct with acute cholecystitis without obstruction: Secondary | ICD-10-CM | POA: Diagnosis not present

## 2020-12-15 DIAGNOSIS — I25110 Atherosclerotic heart disease of native coronary artery with unstable angina pectoris: Secondary | ICD-10-CM | POA: Diagnosis not present

## 2020-12-15 DIAGNOSIS — K851 Biliary acute pancreatitis without necrosis or infection: Secondary | ICD-10-CM | POA: Diagnosis not present

## 2020-12-15 DIAGNOSIS — I10 Essential (primary) hypertension: Secondary | ICD-10-CM

## 2020-12-15 DIAGNOSIS — I5042 Chronic combined systolic (congestive) and diastolic (congestive) heart failure: Secondary | ICD-10-CM

## 2020-12-15 LAB — CBC
HCT: 34.1 % — ABNORMAL LOW (ref 39.0–52.0)
Hemoglobin: 11.2 g/dL — ABNORMAL LOW (ref 13.0–17.0)
MCH: 19.6 pg — ABNORMAL LOW (ref 26.0–34.0)
MCHC: 32.8 g/dL (ref 30.0–36.0)
MCV: 59.8 fL — ABNORMAL LOW (ref 80.0–100.0)
Platelets: 145 10*3/uL — ABNORMAL LOW (ref 150–400)
RBC: 5.7 MIL/uL (ref 4.22–5.81)
RDW: 17.7 % — ABNORMAL HIGH (ref 11.5–15.5)
WBC: 7.7 10*3/uL (ref 4.0–10.5)
nRBC: 0 % (ref 0.0–0.2)

## 2020-12-15 LAB — COMPREHENSIVE METABOLIC PANEL
ALT: 132 U/L — ABNORMAL HIGH (ref 0–44)
AST: 107 U/L — ABNORMAL HIGH (ref 15–41)
Albumin: 2.5 g/dL — ABNORMAL LOW (ref 3.5–5.0)
Alkaline Phosphatase: 339 U/L — ABNORMAL HIGH (ref 38–126)
Anion gap: 9 (ref 5–15)
BUN: 14 mg/dL (ref 8–23)
CO2: 22 mmol/L (ref 22–32)
Calcium: 8.3 mg/dL — ABNORMAL LOW (ref 8.9–10.3)
Chloride: 107 mmol/L (ref 98–111)
Creatinine, Ser: 1.16 mg/dL (ref 0.61–1.24)
GFR, Estimated: >60 mL/min (ref >60)
Glucose, Bld: 94 mg/dL (ref 70–99)
Potassium: 3.2 mmol/L — ABNORMAL LOW (ref 3.5–5.1)
Sodium: 138 mmol/L (ref 135–145)
Total Bilirubin: 5.9 mg/dL — ABNORMAL HIGH (ref 0.3–1.2)
Total Protein: 5.3 g/dL — ABNORMAL LOW (ref 6.5–8.1)

## 2020-12-15 LAB — LIPASE, BLOOD: Lipase: 601 U/L — ABNORMAL HIGH (ref 11–51)

## 2020-12-15 MED ORDER — LACTATED RINGERS IV SOLN: INTRAVENOUS | Status: DC

## 2020-12-15 NOTE — Progress Notes (Signed)
Daily Rounding Note  12/15/2020, 9:40 AM  LOS: 1 day   SUBJECTIVE:   Chief complaint: Biliary pancreatitis.  Suspicion of choledocholithiasis.   Patient has not had any recurrent abdominal pain since he received the morphine yesterday in the ED.  Tolerating clear liquids but not much of an appetite.  Slight nausea at times, no vomiting.  Overall feeling better  BPs trending hypotensive at 80s to low 100s/50s to 60s.  Heart rate in the 70s.  Sats in the mid to upper 90s on room air. Net I/Os for less than 24 hours yesterday: +516 mL though there was some unmeasured urine output  OBJECTIVE:         Vital signs in last 24 hours:    Temp:  [97.8 F (36.6 C)-100.1 F (37.8 C)] 98.5 F (36.9 C) (02/22 0730) Pulse Rate:  [64-96] 73 (02/22 0730) Resp:  [16-25] 18 (02/22 0730) BP: (82-116)/(52-78) 82/60 (02/22 0730) SpO2:  [91 %-98 %] 98 % (02/22 0730) Weight:  [82.9 kg] 82.9 kg (02/21 1548) Last BM Date: 12/14/20 Filed Weights   12/14/20 1548  Weight: 82.9 kg   General: Comfortable, NAD, nontoxic, does not look acutely ill.  Resting comfortably in bed. Heart: RRR. Chest: Clear bilaterally. Abdomen: Soft, nontender, nondistended, active bowel sounds. Extremities: No CCE Neuro/Psych: Fully alert.  Oriented x3.  No gross deficits.  No tremors.  Intake/Output from previous day: 02/21 0701 - 02/22 0700 In: 516.4 [I.V.:16.4; IV Piggyback:500] Out: -   Intake/Output this shift: Total I/O In: 170 [P.O.:120; IV Piggyback:50] Out: -   Lab Results: Recent Labs    12/14/20 0901 12/15/20 0432  WBC 11.0* 7.7  HGB 11.9* 11.2*  HCT 39.1 34.1*  PLT 178 145*   BMET Recent Labs    12/14/20 0901 12/15/20 0432  NA 138 138  K 3.6 3.2*  CL 108 107  CO2 19* 22  GLUCOSE 146* 94  BUN 15 14  CREATININE 1.03 1.16  CALCIUM 9.0 8.3*   LFT Recent Labs    12/14/20 0901 12/15/20 0432  PROT 6.9 5.3*  ALBUMIN 3.3* 2.5*   AST 223* 107*  ALT 214* 132*  ALKPHOS 476* 339*  BILITOT 4.5* 5.9*  BILIDIR 2.1*  --    PT/INR No results for input(s): LABPROT, INR in the last 72 hours. Hepatitis Panel No results for input(s): HEPBSAG, HCVAB, HEPAIGM, HEPBIGM in the last 72 hours.  Studies/Results: DG Chest 2 View  Result Date: 12/14/2020 CLINICAL DATA:  Chest pain EXAM: CHEST - 2 VIEW COMPARISON:  05/13/2020 FINDINGS: No new consolidation or edema. Cystic change at the right lung base on prior CT is not as well reproduced on radiograph. No pleural effusion or pneumothorax. Stable cardiomediastinal contours with normal heart size. Mild loss of height of a lower thoracic vertebral body. IMPRESSION: No acute process in the chest. Age-indeterminate mild lower thoracic vertebral body compression fracture. Electronically Signed   By: Guadlupe Spanish M.D.   On: 12/14/2020 09:33   CT Abdomen Pelvis W Contrast  Result Date: 12/14/2020 CLINICAL DATA:  Epigastric pain for 2 weeks. Concern for choledocholithiasis on ultrasound. EXAM: CT ABDOMEN AND PELVIS WITH CONTRAST TECHNIQUE: Multidetector CT imaging of the abdomen and pelvis was performed using the standard protocol following bolus administration of intravenous contrast. CONTRAST:  OMNIPAQUE IOHEXOL 300 MG/ML  SOLN COMPARISON:  Ultrasound of earlier today.  CTA chest 03/15/2019. FINDINGS: Lower chest: Similar appearance of right lower lobe consolidation with bronchiectasis  including on 11/05. Arterial supply off the upper abdominal aorta is more apparent on the prior CTA. Multivessel coronary artery atherosclerosis. Small hiatal hernia with fluid in the herniated stomach including on 10/03. Distal esophageal fluid as well. Hepatobiliary: Normal liver.  No calcified gallstones. Mild intrahepatic biliary duct dilatation. Moderate common duct dilatation, including at 1.5 cm on 81/7. This is followed to the level of the ampulla. Subtle hyperattenuation in this area including at 6  mm on 39/3. Pancreas: No pancreatic duct dilatation. Suspect mild peripancreatic edema, including adjacent the tail on 26/3 and the head on 31/3. Spleen: Normal in size, without focal abnormality. Adrenals/Urinary Tract: Normal adrenal glands. Normal kidneys, without hydronephrosis. Normal urinary bladder. Stomach/Bowel: Gastric body underdistention. Normal colon and terminal ileum. Normal small bowel. Vascular/Lymphatic: Aortic atherosclerosis. No abdominopelvic adenopathy. Reproductive: Normal prostate. Trace right scrotal fluid, likely physiologic. Other: No significant free fluid. Musculoskeletal: No acute osseous abnormality. IMPRESSION: 1. Intra and extrahepatic biliary duct dilatation with subtle hyperattenuation at the level of the ampulla. Equivocal for 6 mm stone versus ampullary edema. Consider further evaluation with ERCP. If preprocedure differentiation is desired, MRCP may be informative. 2. Suspicion of mild pancreatitis, lipase pending. 3. Similar right lower lobe process compared to 03/15/2019, most consistent with pulmonary sequestration, favor intralobar type. 4. Small hiatal hernia with fluid in the herniated stomach and distal esophagus, suggesting dysmotility and/or gastroesophageal reflux. 5.  Aortic Atherosclerosis (ICD10-I70.0). Electronically Signed   By: Jeronimo Greaves M.D.   On: 12/14/2020 12:22   US Abdomen Limited RUQ (LIVER/GB)  Result Date: 12/14/2020 CLINICAL DATA:  Right upper quadrant pain, jaundice. EXAM: ULTRASOUND ABDOMEN LIMITED RIGHT UPPER QUADRANT COMPARISON:  03/15/2019. FINDINGS: Gallbladder: Prominence of the gallbladder wall measuring up to 3.7 mm. Intraluminal calculi measuring up to 1.2 cm with sludge. Sonographic Eulah Pont sign could not be assessed secondary to premedication. Common bile duct: Diameter: 15.4 mm proximally. Overlying bowel gas limits visualization of the distal CBD. Liver: No focal lesion identified. Within normal limits in parenchymal echogenicity.  Mild intrahepatic biliary dilatation. Portal vein is patent on color Doppler imaging with normal direction of blood flow towards the liver. Other: None. IMPRESSION: Cholelithiasis and intrahepatic/extrahepatic biliary dilatation is suspicious for choledocholithiasis. Cholecystitis is lower on the differential given lack of wall edema/pericholecystic free fluid. Sonographic Eulah Pont sign could not be assessed secondary to premedication. Electronically Signed   By: Stana Bunting M.D.   On: 12/14/2020 10:58    Scheduled Meds: . aspirin EC  81 mg Oral Daily  . atorvastatin  80 mg Oral Daily  . metoprolol succinate  25 mg Oral Daily  . pantoprazole  40 mg Oral Daily   Continuous Infusions: . sodium chloride 75 mL/hr at 12/15/20 0635  . piperacillin-tazobactam (ZOSYN)  IV 3.375 g (12/15/20 0819)   PRN Meds:.acetaminophen **OR** acetaminophen, hydrALAZINE, morphine injection, ondansetron **OR** ondansetron (ZOFRAN) IV, oxyCODONE   ASSESMENT:   *  Biliary pancreatitis.  Abdominal pain resolved for close to 24 hours. Lipase 5978 >> 601 in less than 24-hours.  WBCs normalized.   *   Dilated CBD.  Suspicion of choledocholithiasis.   The exception of T bili rising from 4.5 >> 1.9 all other LFTs (Phos, transaminases) are downtrending. Day 2 Zosyn (per hospitalist Dr Ophelia Charter)  *   Cholelithiasis.  No strong evidence of cholecystitis.    *   Chronic Plavix.  Last dose AM 2/21.     *    Hypokalemia, 3.2.   PLAN   *   Timing of  ERCP TBD, Dr Marina Goodell is covering biliary service for [place] this week.    *   If he continues to have downtrending LFTs and lack of recurrent symptoms, consider MRCP tomorrow to determine if he may have passed the suspected CBD stone  *   Up fluids from 75 mL/hour to 150 mL/hour.  Switch to lactated Ringer's.  *   Going to stop the Zosyn, do not see strong evidence of cholangitis.      Jennye Moccasin  12/15/2020, 9:40 AM Phone [phone]

## 2020-12-15 NOTE — Consult Note (Signed)
Cardiology Consultation:   Patient ID: Martin [facility]; [id number]; [birth year]   Admit date: 12/14/2020 Date of Consult: 12/15/2020  Primary Care Provider: Andree Moro, DO Primary Cardiologist: Dr. Daneen Schick, MD  Patient Profile:   Martin [facility] is a 70 y.o. Mcdowell with a hx of CAD with prior anterior MI 04/2020 with PCI to mid LAD, RCA and left circumflex arteries, chronic systolic CHF with LVEF at 57 to 40% despite guideline directed medical therapy, HTN, dress syndrome and HLD who is being seen today for preoperative cardiac clearance prior to laparoscopic cholecystectomy at the request of Martin Danker, PA-C.   History of Present Illness:   Martin [facility] 70 year old Mcdowell with a history as stated above who presented to [facility] on 12/14/2020 with abdominal pain which began approximately 3 weeks ago.  Pain noted to be mid epigastric area which fluctuated on its own.  He states that the evening prior to admission, the pain worsened acutely.  Given this, he presented to the ED for further evaluation.  In the ED, lipase found to be markedly elevated at greater than 5000 with LFT elevation.  Abdominal CT confirmed choledocholithiasis with pancreatitis.  GI and surgery teams consulted. Pancreatitis managed with IV fluid hydration.  GI plan for ERCP once Plavix washout with plans for cholecystectomy once pancreatitis and choledocholithiasis are resolve.  Plavix held on admission 12/14/2020.  Plan per last [facility] 05/18/2020 was for uninterrupted DAPT with ASA and Brilinta (changed to Plavix) for a minimum of 12 months given stent placement x3.  On my assessment, he has not had any unstable cardiac symptoms. He can achieve 4 METs or greater without anginal symptoms. He is not fluid volume overloaded on exam. Due to his complex CAD, PCI to mLAD with staged PCI to RCA and LCx was performed 04/2020. Plan at that time was for uninterrupted DAPT with ASA and Brilinta for at least 12 months. His Brilinta was transitioned to  Plavix. This was held on ED presentation for possible surgery. Unfortunately thios places him at higher risk for surgery. According to [facility] and AHA guidelines, he requires no further cardiac workup at this time but carries a higher risk for intra and peri-procedural MI/ cardiac arrest due to CAD hx.   [facility] is followed by [facility] for his cardiology care. He was initially seen by her service 04/2020 after presenting to [facility] emergency department with substernal chest pain with radiation to his left arm with associated diaphoresis and dizziness.  Due to prolonged ED wait times, he left AMA.  Brief cardiac work-up at that time showed stable EKG with negative high-sensitivity troponin.  After that, his symptoms persisted for several days at which time he called EMS with repeat EKG showing ST elevation in anterior leads.  He was taken urgently to [facility] performed 05/15/2020 which showed occluded mid LAD treated with PTCA and stent placement, 85% mid OM1 lesion, 95% distal left circumflex lesion and 80% proximal to mid RCA lesion.  He was placed on DAPT with ASA and Brilinta.  Echocardiogram performed 05/13/2020 showed LVEF at 35 to 40% with no significant valvular disease.  He was then taken back to the cardiac [facility] 05/18/2020 and underwent staged PCI with stent placement to the RCA and left circumflex.  He was started on losartan 25 mg daily along with Toprol XL and high intensity atorvastatin.  Repeat echocardiogram performed 07/2020 with no change in LV function.  At that time, losartan was stopped and he was started on low-dose  Entresto 24/26.  Most recent cardiology follow-up 10/14/2020 with reports of intermittent chest pain however not similar to prior anginal symptoms.  He was encouraged to try his sublingual nitroglycerin if needed and to inform our team if symptoms were to recur.  His Entresto was uptitrated to 49/51 dosing and he was placed on DAPT with ASA and Plavix.  Plan was for repeat echocardiogram in  3 months.  Past Medical History:  Diagnosis Date  . Anemia   . CAD (coronary artery disease)    s/p stents  . Chronic combined systolic (congestive) and diastolic (congestive) heart failure ([facility]) 12/14/2020  . Chronic systolic CHF (congestive heart failure) ([place])   . DRESS syndrome   . GERD (gastroesophageal reflux disease)   . Hiatal hernia   . Hypertension   . 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 CORONARY STENT INTERVENTION N/A 05/18/2020   Procedure: CORONARY STENT INTERVENTION;  Surgeon: Martin Crome, MD;  Location: [facility];  Service: Cardiovascular;  Laterality: N/A;  . CORONARY/GRAFT ACUTE MI REVASCULARIZATION N/A 05/15/2020   Procedure: CORONARY/GRAFT ACUTE MI REVASCULARIZATION;  Surgeon: Martin Crome, MD;  Location: [facility];  Service: Cardiovascular;  Laterality: N/A;     Prior to Admission medications   Medication Sig Start Date End Date Taking? Authorizing Provider  aspirin EC 81 MG EC tablet Take 1 tablet (81 mg total) by mouth daily. Swallow whole. 05/20/20  Yes Martin Deforest, PA  atorvastatin (LIPITOR) 80 MG tablet Take 1 tablet (80 mg total) by mouth daily. 05/20/20  Yes Martin Deforest, PA  clopidogrel (PLAVIX) 75 MG tablet Take 1 tablet (75 mg total) by mouth daily. 08/24/20  Yes Martin Crome, MD  niacin 500 MG tablet Take 500 mg by mouth daily.   Yes [provider]  pantoprazole (PROTONIX) 40 MG tablet Take 40 mg by mouth daily.   Yes [provider]  sacubitril-valsartan (ENTRESTO) 49-51 MG Take 1 tablet by mouth 2 (two) times daily. 10/14/20  Yes Martin Junes, NP  metoprolol succinate (TOPROL-XL) 25 MG 24 hr tablet Take 0.5 tablets (12.5 mg total) by mouth daily. 12/15/20   Martin Crome, MD  nitroGLYCERIN (NITROSTAT) 0.4 MG SL tablet Place 1 tablet (0.4 mg total) under the tongue  every 5 (five) minutes x 3 doses as needed for chest pain. 05/19/20   Martin Deforest, PA    Inpatient Medications: Scheduled Meds: . aspirin EC  81 mg Oral Daily  . atorvastatin  80 mg Oral Daily  . metoprolol succinate  25 mg Oral Daily  . pantoprazole  40 mg Oral Daily   Continuous Infusions: . lactated ringers 125 mL/hr at 12/15/20 1103   PRN Meds: acetaminophen **OR** acetaminophen, hydrALAZINE, morphine injection, ondansetron **OR** ondansetron (ZOFRAN) IV, oxyCODONE  Allergies:    Allergies  Allergen Reactions  . Sulfamethoxazole-Trimethoprim Anaphylaxis and Rash    DRESS syndrome requiring hospitalization; 12/11/2018    Social History:   Social History   Socioeconomic History  . Marital status: Married    Spouse name: Not on file  . Number of children: Not on file  . Years of education: Not on file  . Highest education level: Not on file  Occupational History  . Not on file  Tobacco Use  . Smoking status: Former Smoker  Packs/day: 2.00    Years: 22.00    Pack years: 44.00    Types: Cigarettes    Quit date: 19    Years since quitting: 32.1  . Smokeless tobacco: Never Used  Vaping Use  . Vaping Use: Never used  Substance and Sexual Activity  . Alcohol use: Yes    Alcohol/week: 2.0 - 3.0 standard drinks    Types: 2 - 3 Standard drinks or equivalent per week    Comment: Occ   . Drug use: No  . Sexual activity: Not on file  Other Topics Concern  . Not on file  Social History Narrative  . Not on file   Social Determinants of Health   Financial Resource Strain: Not on file  Food Insecurity: Not on file  Transportation Needs: Not on file  Physical Activity: Not on file  Stress: Not on file  Social Connections: Not on file  Intimate Partner Violence: Not on file    Family History:   Family History  Problem Relation Age of Onset  . Colon cancer Neg Hx   . Stomach cancer Neg Hx   . Pancreatic cancer Neg Hx   . Esophageal cancer Neg Hx   . Rectal  cancer Neg Hx    Family Status:  Family Status  Relation Name Status  . Mother  Deceased  . Father  Deceased  . Brother  Alive  . Neg Hx  (Not Specified)    ROS:  Please see the history of present illness.  All other ROS reviewed and negative.     Physical Exam/Data:   Vitals:   12/14/20 2358 12/15/20 0524 12/15/20 0730 12/15/20 1154  BP: (!) 91/54 (!) 108/52 (!) 82/60 (!) 91/59  Pulse: 68 64 73 70  Resp: '16 18 18 17  ' Temp: 99.2 F (37.3 C) 97.8 F (36.6 C) 98.5 F (36.9 C) 98.6 F (37 C)  TempSrc: Oral Oral Oral Oral  SpO2: 94% 97% 98%   Weight:      Height:        Intake/Output Summary (Last 24 hours) at 12/15/2020 1527 Last data filed at 12/15/2020 1200 Gross per 24 hour  Intake 1730.43 ml  Output --  Net 1730.43 ml   Filed Weights   12/14/20 1548  Weight: 82.9 kg   Body mass index is 28.62 kg/m.   General: Well developed, well nourished, NAD Neck: Negative for carotid bruits. No JVD Lungs:Clear to ausculation bilaterally. No wheezes, rales, or rhonchi. Breathing is unlabored. Cardiovascular: RRR with S1 S2. No murmurs Abdomen: Soft, non-tender, non-distended. No obvious abdominal masses. Extremities: No edema. Radial pulses 2+ bilaterally Neuro: Alert and oriented. No focal deficits. No facial asymmetry. MAE spontaneously. Psych: Responds to questions appropriately with normal affect.    EKG:  The EKG was personally reviewed and demonstrates: 12/14/20 NSR with HR 90bpm and no acute changes when compared to prior tracings  Telemetry:  Telemetry was personally reviewed and demonstrates: 12/15/20 NSR   Relevant CV Studies:  Cath 05/15/2020  Anterior myocardial infarction with acute severe onset of chest pain starting an hour prior to presentation at [facility]. Intermittent episodes of pain have been occurring for the previous 48 hours.  Total occlusion of the proximal to mid LAD treated with a 22 x 3.0 Onyx stent postdilated to 3.25 mm in  diameter at 13 atm. TIMI grade III flow noted. Decreased septal blush was noted.  Widely patent left main  Large second obtuse marginal contains segmental mid body  85% stenosis. Ostium of the second obtuse marginal contains 50 to 60% stenosis with poststenotic dilatation in the proximal segment. Circumflex beyond the origin of the second obtuse marginal contains 95% obstruction. A small third obtuse marginal arises beyond. First obtuse marginal is relatively small and contains proximal 75% narrowing.  Right coronary is dominant. PDA is diffusely diseased greater than 90%. Proximal to mid LAD contains eccentric 85% stenosis.  Anterior wall demonstrates severe mid anterior wall to inferoapical hypokinesis/dyskinesis with EF 35%. LVEDP 26 mmHg. Findings are consistent with acute systolic heart failure.  RECOMMENDATIONS:   Aggrastat x18 hours.  Aspirin and Brilinta times at least 12 months.  Continue ARB. Add beta-blocker therapy as tolerated by hemodynamics. Relative bradycardia and low blood pressure currently prevents addition of beta-blocker therapy.  Consider staged multisite intervention on the circumflex and right coronary on Monday depending upon [facility] course. This could also be done within the next 4 to 6 weeks to allow LV recovery.  The Doppler echocardiogram to assess LV function. Heart failure therapy should include ARB/beta-blocker. Consideration to MRA and SGLT2 as needed.     Echo 05/16/2020 1. No evidence of LV thrombus, confirmed with use of echo contrast.. Left  ventricular ejection fraction, by estimation, is 35 to 40%. The left  ventricle has moderately decreased function. The left ventricle  demonstrates regional wall motion  abnormalities (see scoring diagram/findings for description). There is  mild concentric left ventricular hypertrophy. Left ventricular diastolic  parameters are indeterminate.  2. Right ventricular systolic function is  low normal. The right  ventricular size is normal. Tricuspid regurgitation signal is inadequate  for assessing PA pressure.  3. The mitral valve is normal in structure. Trivial mitral valve  regurgitation. No evidence of mitral stenosis.  4. The aortic valve is grossly normal. Aortic valve regurgitation is not  visualized. No aortic stenosis is present.  5. The inferior vena cava is dilated in size with <50% respiratory  variability, suggesting right atrial pressure of 15 mmHg.    Cath 05/18/2020  Proximal to mid 85% RCA successfully treated with a 3.5 x 18 Onyx deployed at high pressure and reducing stenosis to 0%.  95% distal circumflex treated with a 2.0 x 18 mm Onyx and deployed at 12 atm x 2. 0% stenosis noted with TIMI grade III flow.  Intervention was not performed on the obtuse marginal which contained tandem 50 to 70% stenoses in the mid body and 60% ostial disease.  The previously placed LAD stent during STEMI was widely patent.  LVEDP 12 mmHg before intervention performed today.  RECOMMENDATIONS:   Given normal LVEDP, will discontinue spironolactone.  Resume losartan as blood pressure allows.  Initiate beta-blocker therapy as heart rate and blood pressure allow.  Phase 2 cardiac rehab  Aggressive lipid-lowering to LDL less than 55 with assistance of SGLT2 if needed.  Eligible for discharge in a.m.  Laboratory Data:  Chemistry Recent Labs  [facility] 12/14/20 0901 12/15/20 0432  NA 138 138  K 3.6 3.2*  CL 108 107  CO2 19* 22  GLUCOSE 146* 94  BUN 15 14  CREATININE 1.03 1.16  CALCIUM 9.0 8.3*  GFRNONAA >60 >60  ANIONGAP 11 9    Total Protein  Date Value Ref Range Status  12/15/2020 5.3 (L) 6.5 - 8.1 g/dL Final  06/25/2020 5.8 (L) 6.0 - 8.5 g/dL Final   Albumin  Date Value Ref Range Status  12/15/2020 2.5 (L) 3.5 - 5.0 g/dL Final  06/25/2020 3.7 (L) 3.8 - 4.8  g/dL Final   AST  Date Value Ref Range Status  12/15/2020 107 (H) 15 - 41 U/L  Final   ALT  Date Value Ref Range Status  12/15/2020 132 (H) 0 - 44 U/L Final   Alkaline Phosphatase  Date Value Ref Range Status  12/15/2020 339 (H) 38 - 126 U/L Final   Total Bilirubin  Date Value Ref Range Status  12/15/2020 5.9 (H) 0.3 - 1.2 mg/dL Final   Bilirubin Total  Date Value Ref Range Status  06/25/2020 1.3 (H) 0.0 - 1.2 mg/dL Final   Hematology Recent Labs  [facility] 12/14/20 0901 12/15/20 0432  WBC 11.0* 7.7  RBC 6.36* 5.70  HGB 11.9* 11.2*  HCT 39.1 34.1*  MCV 61.5* 59.8*  MCH 18.7* 19.6*  MCHC 30.4 32.8  RDW 17.9* 17.7*  PLT 178 145*   Cardiac EnzymesNo results for input(s): TROPONINI in the last 168 hours. No results for input(s): TROPIPOC in the last 168 hours.  BNPNo results for input(s): BNP, PROBNP in the last 168 hours.  DDimer No results for input(s): DDIMER in the last 168 hours. TSH:  [facility] Results  Component Value Date   TSH 1.85 08/07/2018   Lipids: [facility] Results  Component Value Date   CHOL 139 06/25/2020   HDL 45 06/25/2020   LDLCALC 75 06/25/2020   TRIG 102 06/25/2020   CHOLHDL 3.1 06/25/2020   HgbA1c: [facility] Results  Component Value Date   HGBA1C 5.8 (H) 05/15/2020    Radiology/Studies:  DG Chest 2 View  Result Date: 12/14/2020 CLINICAL DATA:  Chest pain EXAM: CHEST - 2 VIEW COMPARISON:  05/13/2020 FINDINGS: No new consolidation or edema. Cystic change at the right lung base on prior CT is not as well reproduced on radiograph. No pleural effusion or pneumothorax. Stable cardiomediastinal contours with normal heart size. Mild loss of height of a lower thoracic vertebral body. IMPRESSION: No acute process in the chest. Age-indeterminate mild lower thoracic vertebral body compression fracture. Electronically Signed   By: Macy Mis M.D.   On: 12/14/2020 09:33   CT Abdomen Pelvis W Contrast  Result Date: 12/14/2020 CLINICAL DATA:  Epigastric pain for 2 weeks. Concern for choledocholithiasis on ultrasound. EXAM: CT ABDOMEN AND PELVIS  WITH CONTRAST TECHNIQUE: Multidetector CT imaging of the abdomen and pelvis was performed using the standard protocol following bolus administration of intravenous contrast. CONTRAST:  122m OMNIPAQUE IOHEXOL 300 MG/ML  SOLN COMPARISON:  Ultrasound of earlier today.  CTA chest 03/15/2019. FINDINGS: Lower chest: Similar appearance of right lower lobe consolidation with bronchiectasis including on 11/05. Arterial supply off the upper abdominal aorta is more apparent on the prior CTA. Multivessel coronary artery atherosclerosis. Small hiatal hernia with fluid in the herniated stomach including on 10/03. Distal esophageal fluid as well. Hepatobiliary: Normal liver.  No calcified gallstones. Mild intrahepatic biliary duct dilatation. Moderate common duct dilatation, including at 1.5 cm on 81/7. This is followed to the level of the ampulla. Subtle hyperattenuation in this area including at 6 mm on 39/3. Pancreas: No pancreatic duct dilatation. Suspect mild peripancreatic edema, including adjacent the tail on 26/3 and the head on 31/3. Spleen: Normal in size, without focal abnormality. Adrenals/Urinary Tract: Normal adrenal glands. Normal kidneys, without hydronephrosis. Normal urinary bladder. Stomach/Bowel: Gastric body underdistention. Normal colon and terminal ileum. Normal small bowel. Vascular/Lymphatic: Aortic atherosclerosis. No abdominopelvic adenopathy. Reproductive: Normal prostate. Trace right scrotal fluid, likely physiologic. Other: No significant Martin fluid. Musculoskeletal: No acute osseous abnormality. IMPRESSION: 1. Intra and extrahepatic biliary duct dilatation  with subtle hyperattenuation at the level of the ampulla. Equivocal for 6 mm stone versus ampullary edema. Consider further evaluation with ERCP. If preprocedure differentiation is desired, MRCP may be informative. 2. Suspicion of mild pancreatitis, lipase pending. 3. Similar right lower lobe process compared to 03/15/2019, most consistent with  pulmonary sequestration, favor intralobar type. 4. Small hiatal hernia with fluid in the herniated stomach and distal esophagus, suggesting dysmotility and/or gastroesophageal reflux. 5.  Aortic Atherosclerosis (ICD10-I70.0). Electronically Signed   By: Abigail Miyamoto M.D.   On: 12/14/2020 12:22   US Abdomen Limited RUQ (LIVER/GB)  Result Date: 12/14/2020 CLINICAL DATA:  Right upper quadrant pain, jaundice. EXAM: ULTRASOUND ABDOMEN LIMITED RIGHT UPPER QUADRANT COMPARISON:  03/15/2019. FINDINGS: Gallbladder: Prominence of the gallbladder wall measuring up to 3.7 mm. Intraluminal calculi measuring up to 1.2 cm with sludge. Sonographic Percell Miller sign could not be assessed secondary to premedication. Common bile duct: Diameter: 15.4 mm proximally. Overlying bowel gas limits visualization of the distal CBD. Liver: No focal lesion identified. Within normal limits in parenchymal echogenicity. Mild intrahepatic biliary dilatation. Portal vein is patent on color Doppler imaging with normal direction of blood flow towards the liver. Other: None. IMPRESSION: Cholelithiasis and intrahepatic/extrahepatic biliary dilatation is suspicious for choledocholithiasis. Cholecystitis is lower on the differential given lack of wall edema/pericholecystic Martin fluid. Sonographic Percell Miller sign could not be assessed secondary to premedication. Electronically Signed   By: Primitivo Gauze M.D.   On: 12/14/2020 10:58   Assessment and Plan:   1.  Cardiac clearance for laparoscopic: -Pt presented to [facility] on 12/14/2020 with abdominal pain which began approximately 3 weeks ago.  Pain noted to be mid epigastric area which fluctuated on its own.  He states that the evening prior to admission, the pain worsened acutely.   -In the ED, lipase found to be markedly elevated at greater than 5000 with LFT elevation.  Abdominal CT confirmed choledocholithiasis with pancreatitis.   -GI and surgery teams consulted. Pancreatitis managed with IV fluid  hydration.  GI plan for ERCP once Plavix washout with plans for cholecystectomy once pancreatitis and choledocholithiasis are resolve.  -Plavix held on admission 12/14/2020>>Plan per last [facility] 05/18/2020 was for uninterrupted DAPT with ASA and Brilinta (changed to Plavix) for a minimum of 12 months given stent placement x3. -On my assessment, he has not had any unstable cardiac symptoms. He can achieve 4 METs or greater without anginal symptoms. He is not fluid volume overloaded on exam. He has complex CAD with PCI to mLAD with staged PCI to RCA and LCx performed 04/2020. Plan at that time was for uninterrupted DAPT with ASA and Brilinta for at least 12 months. His Brilinta was transitioned to Plavix. This was held on ED presentation for possible surgery. Unfortunately this places him at higher risk for surgery. According to [facility] and AHA guidelines, he requires no further cardiac workup at this time but carries a higher risk for intra and peri-procedural MI/cardiac arrest due to his CAD hx. His fluids are currently running at 167m/hr therefore he will need close monitoring of his volume status given EF at 35-40% on last echo in 07/2020. May need intra/post operative Lasix.    2.  CAD with recent PCI x3 04/2020 to mLAD, LCx and RCA: -Patient was initially seen by cardiology service 04/2020 after presenting with chest pain however he left AMA due to ED wait times but returned several days later with persistent symptoms found to have an anterior STEMI.  He was taken to the cardiac  [facility] with PCI to mid LAD with staged PCI to RCA and LCx several days later. -Echocardiogram showed reduced LV function and he has been treated with guideline directed medical therapy. -Continue ASA 81,Toprol XL 25 -Hold statin given elevated LFT/Lipase   3.  Ischemic cardiomyopathy: -As above, echocardiogram 04/2020 with LVEF at 30 to 35% initially treated with losartan however due to persistently low EF on a repeat echocardiogram  07/2020 he was placed on low-dose Entresto which was rather difficult to titrate due to hypotension.  Most recent follow-up 09/2020 Delene Loll was uptitrated to 49/51 dosing. -Creatinine, 1.16 -Continue to hold Entresto 49/51 due to hypotension -May need to restart at lower dose 24/26 after surgery   4.  HTN: -Soft, 91/59>>82/60>>108/52 -Reduce Toprol to 12.5>>could also consider changing to tartrate until BPs more stable  -Hold PTA Entresto for now   5.  HLD: -Last LDL, 168>>goal LDL <70 due to CAD  -On OP atorvastatin however on hold due to acute pancreatitis  -Will need lipid clinic referral for PCSK9 inhibitor after d/c   For questions or updates, please contact [facility] Please consult [email or web address] for contact info under Cardiology/STEMI.   SignedKathyrn Drown NP-C [facility] Pager: [phone] 12/15/2020 3:27 PM

## 2020-12-15 NOTE — Progress Notes (Signed)
Subjective: No pain today after last dose of morphine yesterday.  Tolerating CLD with no issues.  Last dose of plavix was yesterday morning.  ROS: See above, otherwise other systems negative  Objective: Vital signs in last 24 hours: Temp:  [97.8 F (36.6 C)-100.1 F (37.8 C)] 98.5 F (36.9 C) (02/22 0730) Pulse Rate:  [64-92] 73 (02/22 0730) Resp:  [16-24] 18 (02/22 0730) BP: (82-114)/(52-78) 82/60 (02/22 0730) SpO2:  [91 %-98 %] 98 % (02/22 0730) Weight:  [82.9 kg] 82.9 kg (02/21 1548) Last BM Date: 12/14/20  Intake/Output from previous day: 02/21 0701 - 02/22 0700 In: 516.4 [I.V.:16.4; IV Piggyback:500] Out: -  Intake/Output this shift: Total I/O In: 170 [P.O.:120; IV Piggyback:50] Out: -   PE: Heart: regular Lungs: CTAB Abd: soft, not really tender, +BS, ND Psych: A&Ox3  Lab Results:  Recent Labs    12/14/20 0901 12/15/20 0432  WBC 11.0* 7.7  HGB 11.9* 11.2*  HCT 39.1 34.1*  PLT 178 145*   BMET Recent Labs    12/14/20 0901 12/15/20 0432  NA 138 138  K 3.6 3.2*  CL 108 107  CO2 19* 22  GLUCOSE 146* 94  BUN 15 14  CREATININE 1.03 1.16  CALCIUM 9.0 8.3*   PT/INR No results for input(s): LABPROT, INR in the last 72 hours. CMP     Component Value Date/Time   NA 138 12/15/2020 0432   NA 140 10/14/2020 1150   K 3.2 (L) 12/15/2020 0432   CL 107 12/15/2020 0432   CO2 22 12/15/2020 0432   GLUCOSE 94 12/15/2020 0432   BUN 14 12/15/2020 0432   BUN 17 10/14/2020 1150   CREATININE 1.16 12/15/2020 0432   CREATININE 0.79 10/29/2014 1310   CALCIUM 8.3 (L) 12/15/2020 0432   PROT 5.3 (L) 12/15/2020 0432   PROT 5.8 (L) 06/25/2020 0945   ALBUMIN 2.5 (L) 12/15/2020 0432   ALBUMIN 3.7 (L) 06/25/2020 0945   AST 107 (H) 12/15/2020 0432   ALT 132 (H) 12/15/2020 0432   ALKPHOS 339 (H) 12/15/2020 0432   BILITOT 5.9 (H) 12/15/2020 0432   BILITOT 1.3 (H) 06/25/2020 0945   GFRNONAA >60 12/15/2020 0432   GFRAA 87 10/14/2020 1150   Lipase      Component Value Date/Time   LIPASE 601 (H) 12/15/2020 0432       Studies/Results: DG Chest 2 View  Result Date: 12/14/2020 CLINICAL DATA:  Chest pain EXAM: CHEST - 2 VIEW COMPARISON:  05/13/2020 FINDINGS: No new consolidation or edema. Cystic change at the right lung base on prior CT is not as well reproduced on radiograph. No pleural effusion or pneumothorax. Stable cardiomediastinal contours with normal heart size. Mild loss of height of a lower thoracic vertebral body. IMPRESSION: No acute process in the chest. Age-indeterminate mild lower thoracic vertebral body compression fracture. Electronically Signed   By: Guadlupe Spanish M.D.   On: 12/14/2020 09:33   CT Abdomen Pelvis W Contrast  Result Date: 12/14/2020 CLINICAL DATA:  Epigastric pain for 2 weeks. Concern for choledocholithiasis on ultrasound. EXAM: CT ABDOMEN AND PELVIS WITH CONTRAST TECHNIQUE: Multidetector CT imaging of the abdomen and pelvis was performed using the standard protocol following bolus administration of intravenous contrast. CONTRAST:  OMNIPAQUE IOHEXOL 300 MG/ML  SOLN COMPARISON:  Ultrasound of earlier today.  CTA chest 03/15/2019. FINDINGS: Lower chest: Similar appearance of right lower lobe consolidation with bronchiectasis including on 11/05. Arterial supply off the upper abdominal aorta is more apparent on  the prior CTA. Multivessel coronary artery atherosclerosis. Small hiatal hernia with fluid in the herniated stomach including on 10/03. Distal esophageal fluid as well. Hepatobiliary: Normal liver.  No calcified gallstones. Mild intrahepatic biliary duct dilatation. Moderate common duct dilatation, including at 1.5 cm on 81/7. This is followed to the level of the ampulla. Subtle hyperattenuation in this area including at 6 mm on 39/3. Pancreas: No pancreatic duct dilatation. Suspect mild peripancreatic edema, including adjacent the tail on 26/3 and the head on 31/3. Spleen: Normal in size, without focal  abnormality. Adrenals/Urinary Tract: Normal adrenal glands. Normal kidneys, without hydronephrosis. Normal urinary bladder. Stomach/Bowel: Gastric body underdistention. Normal colon and terminal ileum. Normal small bowel. Vascular/Lymphatic: Aortic atherosclerosis. No abdominopelvic adenopathy. Reproductive: Normal prostate. Trace right scrotal fluid, likely physiologic. Other: No significant free fluid. Musculoskeletal: No acute osseous abnormality. IMPRESSION: 1. Intra and extrahepatic biliary duct dilatation with subtle hyperattenuation at the level of the ampulla. Equivocal for 6 mm stone versus ampullary edema. Consider further evaluation with ERCP. If preprocedure differentiation is desired, MRCP may be informative. 2. Suspicion of mild pancreatitis, lipase pending. 3. Similar right lower lobe process compared to 03/15/2019, most consistent with pulmonary sequestration, favor intralobar type. 4. Small hiatal hernia with fluid in the herniated stomach and distal esophagus, suggesting dysmotility and/or gastroesophageal reflux. 5.  Aortic Atherosclerosis (ICD10-I70.0). Electronically Signed   By: Jeronimo Greaves M.D.   On: 12/14/2020 12:22   US Abdomen Limited RUQ (LIVER/GB)  Result Date: 12/14/2020 CLINICAL DATA:  Right upper quadrant pain, jaundice. EXAM: ULTRASOUND ABDOMEN LIMITED RIGHT UPPER QUADRANT COMPARISON:  03/15/2019. FINDINGS: Gallbladder: Prominence of the gallbladder wall measuring up to 3.7 mm. Intraluminal calculi measuring up to 1.2 cm with sludge. Sonographic Eulah Pont sign could not be assessed secondary to premedication. Common bile duct: Diameter: 15.4 mm proximally. Overlying bowel gas limits visualization of the distal CBD. Liver: No focal lesion identified. Within normal limits in parenchymal echogenicity. Mild intrahepatic biliary dilatation. Portal vein is patent on color Doppler imaging with normal direction of blood flow towards the liver. Other: None. IMPRESSION: Cholelithiasis and  intrahepatic/extrahepatic biliary dilatation is suspicious for choledocholithiasis. Cholecystitis is lower on the differential given lack of wall edema/pericholecystic free fluid. Sonographic Eulah Pont sign could not be assessed secondary to premedication. Electronically Signed   By: Stana Bunting M.D.   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  Will d/w primary service.  ID - zosyn VTE - SCDs, ok for chemical DVT prophylaxis from surgical standpoint FEN - IVF, CLD Foley - none Follow up - TBD   LOS: 1 day    Letha Cape , [facility] Surgery 12/15/2020, 10:15 AM Please see Amion for pager number during day hours 7:00am-4:30pm or 7:00am -11:30am on weekends

## 2020-12-15 NOTE — Progress Notes (Addendum)
PROGRESS NOTE  Martin Mcdowell PQZ:[id number] DOB: [birth year] DOA: 12/14/2020 PCP: Karl Ito, DO   LOS: 1 day   Brief Narrative / Interim history: 70 year old male with history of toxoplasma chorioretinitis, chronic systolic CHF, coronary artery disease with stenting in June 2021, history of dress syndrome, hyperlipidemia comes to the hospital with abdominal pain, nausea and vomiting.  This has been on and off for the past 3 weeks but became more acute and decided to come to the hospital.  He was found to have acute gallstone pancreatitis.  He was placed on fluids, GI and general surgery were consulted.  Subjective / 24h Interval events: He is doing well this morning, denies any abdominal pain, no nausea or vomiting.  No chest pain, no shortness of breath.  Reports compliance with his home medications.  Assessment & Plan: Principal Problem Acute pancreatitis, choledocholithiasis -patient with 3 weeks of abdominal pain, possibly worse after eating.  Was found to have elevation in his lipase on admission and ultrasound showed cholelithiasis with biliary dilatation suggestive of choledocholithiasis.  General surgery and gastroenterology following.  Patient is on Plavix, and last dose was on 2/20 1 AM prior to coming to the hospital.  Now waiting on Plavix washout for ERCP/laparoscopic cholecystectomy.  Continue supportive treatment meanwhile.  He was placed on Zosyn.  AST and ALT slightly better but bilirubin up today  Active Problems Coronary artery disease, prior MI with stents in June 2021, chronic systolic CHF -patient appears euvolemic on exam.  Most recent EF in August 12 2134-40% and grade 2 diastolic dysfunction.  Continue aspirin, statin, beta-blockers as well as Entresto as blood pressure tolerates.  Hold Plavix and continue aspirin alone.  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 Yes; Fluid consistency: Thin  Diet effective now       Question Answer Comment  Room service appropriate? Yes   Fluid consistency: Thin      12/14/20 1333          DVT prophylaxis: SCDs Start: 12/14/20 1418     Code Status: DNR  Family Communication: no family at bedside   Status is: Inpatient  Remains inpatient appropriate because:Inpatient level of care appropriate due to severity of illness   Dispo: The patient is from: Home              Anticipated d/c is to: Home              Anticipated d/c date is: 3 days  Patient currently is not medically stable to d/c.   Difficult to place patient No   Level of care: Med-Surg  Consultants:  General surgery Gastroenterology  Procedures:  None   Microbiology  None   Antimicrobials: Zosyn 2/21 >>    Objective: Vitals:   12/14/20 1948 12/14/20 2358 12/15/20 0524 12/15/20 0730  BP: (!) 102/57 (!) 91/54 (!) 108/52 (!) 82/60  Pulse: 75 68 64 73  Resp: 19 16 18 18   Temp: 99.4 F (37.4 C) 99.2 F (37.3 C) 97.8 F (36.6 C) 98.5 F (36.9 C)  TempSrc: Oral  Oral Oral Oral  SpO2: 91% 94% 97% 98%  Weight:      Height:        Intake/Output Summary (Last 24 hours) at 12/15/2020 0904 Last data filed at 12/15/2020 [id number] Gross per 24 hour  Intake 686.44 ml  Output --  Net 686.44 ml   Filed Weights   12/14/20 1548  Weight: 82.9 kg    Examination:  Constitutional: NAD Eyes: Mild scleral icterus ENMT: Mucous membranes are moist.  Neck: normal, supple Respiratory: clear to auscultation bilaterally, no wheezing, no crackles. Normal respiratory effort.  Cardiovascular: Regular rate and rhythm, no murmurs / rubs / gallops. No LE edema.  Abdomen: non distended, no tenderness. Bowel sounds positive.  Musculoskeletal: no clubbing / cyanosis.  Skin: no rashes Neurologic: CN 2-12 grossly intact. Strength 5/5 in all 4.  Psychiatric: Normal judgment and insight. Alert and oriented x 3. Normal mood.   Data Reviewed: I have independently reviewed following labs and imaging studies   CBC: Recent Labs  Lab 12/14/20 0901 12/15/20 0432  WBC 11.0* 7.7  HGB 11.9* 11.2*  HCT 39.1 34.1*  MCV 61.5* 59.8*  PLT 178 145*   Basic Metabolic Panel: Recent Labs  Lab 12/14/20 0901 12/15/20 0432  NA 138 138  K 3.6 3.2*  CL 108 107  CO2 19* 22  GLUCOSE 146* 94  BUN 15 14  CREATININE 1.03 1.16  CALCIUM 9.0 8.3*   Liver Function Tests: Recent Labs  Lab 12/14/20 0901 12/15/20 0432  AST 223* 107*  ALT 214* 132*  ALKPHOS 476* 339*  BILITOT 4.5* 5.9*  PROT 6.9 5.3*  ALBUMIN 3.3* 2.5*   Coagulation Profile: No results for input(s): INR, PROTIME in the last 168 hours. HbA1C: No results for input(s): HGBA1C in the last 72 hours. CBG: No results for input(s): GLUCAP in the last 168 hours.  Recent Results (from the past 240 hour(s))  Resp Panel by RT-PCR (Flu A&B, Covid) Nasopharyngeal Swab     Status: None   Collection Time: 12/14/20 11:16 AM   Specimen: Nasopharyngeal Swab; Nasopharyngeal(NP) swabs in vial transport medium  Result Value Ref  Range Status   SARS Coronavirus 2 by RT PCR NEGATIVE NEGATIVE Final    Comment: (NOTE) SARS-CoV-2 target nucleic acids are NOT DETECTED.  The SARS-CoV-2 RNA is generally detectable in upper respiratory specimens during the acute phase of infection. The lowest concentration of SARS-CoV-2 viral copies this assay can detect is 138 copies/mL. A negative result does not preclude SARS-Cov-2 infection and should not be used as the sole basis for treatment or other patient management decisions. A negative result may occur with  improper specimen collection/handling, submission of specimen other than nasopharyngeal swab, presence of viral mutation(s) within the areas targeted by this assay, and inadequate number of viral copies(<138 copies/mL). A negative result must be combined with clinical observations, patient history, and epidemiological information. The expected result is Negative.  Fact Sheet for Patients:  BloggerCourse.com  Fact Sheet for Healthcare Providers:  SeriousBroker.it  This test is no t yet approved or cleared by the [place] FDA and  has been authorized for detection and/or diagnosis of SARS-CoV-2 by FDA under an Emergency Use Authorization (EUA). This EUA will remain  in effect (meaning this test can be used) for the duration of the COVID-19 declaration under Section 564(b)(1) of the Act, 21 U.S.C.section 360bbb-3(b)(1), unless the authorization is terminated  or revoked sooner.       Influenza A by PCR NEGATIVE NEGATIVE Final   Influenza B by PCR NEGATIVE NEGATIVE Final    Comment: (NOTE) The Xpert Xpress SARS-CoV-2/FLU/RSV plus assay is intended as an aid in the diagnosis of influenza from Nasopharyngeal swab specimens and should not be used as a sole basis for treatment. Nasal washings and aspirates are unacceptable for Xpert Xpress SARS-CoV-2/FLU/RSV testing.  Fact Sheet for  Patients: BloggerCourse.com  Fact Sheet for Healthcare Providers: SeriousBroker.it  This test is not yet approved or cleared by the [place] FDA and has been authorized for detection and/or diagnosis of SARS-CoV-2 by FDA under an Emergency Use Authorization (EUA). This EUA will remain in effect (meaning this test can be used) for the duration of the COVID-19 declaration under Section 564(b)(1) of the Act, 21 U.S.C. section 360bbb-3(b)(1), unless the authorization is terminated or revoked.  Performed at [facility], [address], [place], [place] [address]      Radiology Studies: DG Chest 2 View  Result Date: 12/14/2020 CLINICAL DATA:  Chest pain EXAM: CHEST - 2 VIEW COMPARISON:  05/13/2020 FINDINGS: No new consolidation or edema. Cystic change at the right lung base on prior CT is not as well reproduced on radiograph. No pleural effusion or pneumothorax. Stable cardiomediastinal contours with normal heart size. Mild loss of height of a lower thoracic vertebral body. IMPRESSION: No acute process in the chest. Age-indeterminate mild lower thoracic vertebral body compression fracture. Electronically Signed   By: Guadlupe Spanish M.D.   On: 12/14/2020 09:33   CT Abdomen Pelvis W Contrast  Result Date: 12/14/2020 CLINICAL DATA:  Epigastric pain for 2 weeks. Concern for choledocholithiasis on ultrasound. EXAM: CT ABDOMEN AND PELVIS WITH CONTRAST TECHNIQUE: Multidetector CT imaging of the abdomen and pelvis was performed using the standard protocol following bolus administration of intravenous contrast. CONTRAST:  OMNIPAQUE IOHEXOL 300 MG/ML  SOLN COMPARISON:  Ultrasound of earlier today.  CTA chest 03/15/2019. FINDINGS: Lower chest: Similar appearance of right lower lobe consolidation with bronchiectasis including on 11/05. Arterial supply off the upper abdominal aorta is more apparent on the prior CTA. Multivessel coronary artery  atherosclerosis. Small hiatal hernia with fluid in the herniated stomach including on 10/03. Distal esophageal fluid as well. Hepatobiliary: Normal liver.  No calcified gallstones. Mild intrahepatic biliary duct dilatation. Moderate common duct dilatation, including at 1.5 cm on 81/7. This is followed to the level of the ampulla. Subtle hyperattenuation in this area including at 6 mm on 39/3. Pancreas: No pancreatic duct dilatation. Suspect mild peripancreatic edema, including adjacent the tail on 26/3 and the head on 31/3. Spleen: Normal in size, without focal abnormality. Adrenals/Urinary Tract: Normal adrenal glands. Normal kidneys, without hydronephrosis. Normal urinary bladder. Stomach/Bowel: Gastric body underdistention. Normal colon and terminal ileum. Normal small bowel. Vascular/Lymphatic: Aortic atherosclerosis. No abdominopelvic adenopathy. Reproductive: Normal prostate. Trace right scrotal fluid, likely physiologic. Other: No significant free fluid. Musculoskeletal: No acute osseous abnormality. IMPRESSION: 1. Intra and extrahepatic biliary duct  dilatation with subtle hyperattenuation at the level of the ampulla. Equivocal for 6 mm stone versus ampullary edema. Consider further evaluation with ERCP. If preprocedure differentiation is desired, MRCP may be informative. 2. Suspicion of mild pancreatitis, lipase pending. 3. Similar right lower lobe process compared to 03/15/2019, most consistent with pulmonary sequestration, favor intralobar type. 4. Small hiatal hernia with fluid in the herniated stomach and distal esophagus, suggesting dysmotility and/or gastroesophageal reflux. 5.  Aortic Atherosclerosis (ICD10-I70.0). Electronically Signed   By: Jeronimo Greaves M.D.   On: 12/14/2020 12:22   US Abdomen Limited RUQ (LIVER/GB)  Result Date: 12/14/2020 CLINICAL DATA:  Right upper quadrant pain, jaundice. EXAM: ULTRASOUND ABDOMEN LIMITED RIGHT UPPER QUADRANT COMPARISON:  03/15/2019. FINDINGS: Gallbladder:  Prominence of the gallbladder wall measuring up to 3.7 mm. Intraluminal calculi measuring up to 1.2 cm with sludge. Sonographic Eulah Pont sign could not be assessed secondary to premedication. Common bile duct: Diameter: 15.4 mm proximally. Overlying bowel gas limits visualization of the distal CBD. Liver: No focal lesion identified. Within normal limits in parenchymal echogenicity. Mild intrahepatic biliary dilatation. Portal vein is patent on color Doppler imaging with normal direction of blood flow towards the liver. Other: None. IMPRESSION: Cholelithiasis and intrahepatic/extrahepatic biliary dilatation is suspicious for choledocholithiasis. Cholecystitis is lower on the differential given lack of wall edema/pericholecystic free fluid. Sonographic Eulah Pont sign could not be assessed secondary to premedication. Electronically Signed   By: Stana Bunting M.D.   On: 12/14/2020 10:58    Pamella Pert, MD, PhD [facility]  Between 7 am - 7 pm I am available, please contact me via Amion or Securechat  Between 7 pm - 7 am I am not available, please contact night coverage MD/APP via Amion

## 2020-12-16 ENCOUNTER — Ambulatory Visit: Payer: Medicare Other | Admitting: Internal Medicine

## 2020-12-16 DIAGNOSIS — K851 Biliary acute pancreatitis without necrosis or infection: Secondary | ICD-10-CM | POA: Diagnosis not present

## 2020-12-16 DIAGNOSIS — I25110 Atherosclerotic heart disease of native coronary artery with unstable angina pectoris: Secondary | ICD-10-CM | POA: Diagnosis not present

## 2020-12-16 DIAGNOSIS — K8042 Calculus of bile duct with acute cholecystitis without obstruction: Secondary | ICD-10-CM | POA: Diagnosis not present

## 2020-12-16 DIAGNOSIS — R1011 Right upper quadrant pain: Secondary | ICD-10-CM | POA: Diagnosis not present

## 2020-12-16 DIAGNOSIS — K8070 Calculus of gallbladder and bile duct without cholecystitis without obstruction: Secondary | ICD-10-CM | POA: Diagnosis not present

## 2020-12-16 LAB — COMPREHENSIVE METABOLIC PANEL
ALT: 95 U/L — ABNORMAL HIGH (ref 0–44)
AST: 61 U/L — ABNORMAL HIGH (ref 15–41)
Albumin: 2.5 g/dL — ABNORMAL LOW (ref 3.5–5.0)
Alkaline Phosphatase: 312 U/L — ABNORMAL HIGH (ref 38–126)
Anion gap: 10 (ref 5–15)
BUN: 9 mg/dL (ref 8–23)
CO2: 21 mmol/L — ABNORMAL LOW (ref 22–32)
Calcium: 8.5 mg/dL — ABNORMAL LOW (ref 8.9–10.3)
Chloride: 108 mmol/L (ref 98–111)
Creatinine, Ser: 0.88 mg/dL (ref 0.61–1.24)
GFR, Estimated: >60 mL/min (ref >60)
Glucose, Bld: 79 mg/dL (ref 70–99)
Potassium: 3.1 mmol/L — ABNORMAL LOW (ref 3.5–5.1)
Sodium: 139 mmol/L (ref 135–145)
Total Bilirubin: 2.6 mg/dL — ABNORMAL HIGH (ref 0.3–1.2)
Total Protein: 5.5 g/dL — ABNORMAL LOW (ref 6.5–8.1)

## 2020-12-16 LAB — CBC
HCT: 31.5 % — ABNORMAL LOW (ref 39.0–52.0)
Hemoglobin: 10.4 g/dL — ABNORMAL LOW (ref 13.0–17.0)
MCH: 19.6 pg — ABNORMAL LOW (ref 26.0–34.0)
MCHC: 33 g/dL (ref 30.0–36.0)
MCV: 59.3 fL — ABNORMAL LOW (ref 80.0–100.0)
Platelets: 153 10*3/uL (ref 150–400)
RBC: 5.31 MIL/uL (ref 4.22–5.81)
RDW: 16.6 % — ABNORMAL HIGH (ref 11.5–15.5)
WBC: 5.5 10*3/uL (ref 4.0–10.5)
nRBC: 0 % (ref 0.0–0.2)

## 2020-12-16 MED ORDER — POTASSIUM CHLORIDE CRYS ER 20 MEQ PO TBCR
40.0000 meq | EXTENDED_RELEASE_TABLET | Freq: Once | ORAL | Status: AC
  Administered 2020-12-16: 40 meq via ORAL
  Filled 2020-12-16: qty 2

## 2020-12-16 MED ORDER — SODIUM CHLORIDE 0.9 % IV SOLN
2.0000 g | INTRAVENOUS | Status: AC
  Administered 2020-12-17: 2 g via INTRAVENOUS
  Filled 2020-12-16 (×2): qty 20

## 2020-12-16 MED ORDER — METOPROLOL SUCCINATE ER 25 MG PO TB24
25.0000 mg | ORAL_TABLET | Freq: Every day | ORAL | Status: DC
  Administered 2020-12-17: 25 mg via ORAL
  Filled 2020-12-16 (×2): qty 1

## 2020-12-16 NOTE — Progress Notes (Addendum)
[facility] ROUNDING NOTE   Subjective: No acute events overnight.  Did have mild RUQ pain lasting only a couple of minutes, and mild in nature.  Resolved quickly and has not recurred.  Otherwise feeling okay.  Objective: Vital signs in last 24 hours: Temp:  [98.6 F (37 C)-99.3 F (37.4 C)] 98.6 F (37 C) (02/23 0833) Pulse Rate:  [70-79] 72 (02/23 0833) Resp:  [16-18] 16 (02/23 0833) BP: (91-109)/(59-78) 109/78 (02/23 0833) SpO2:  [96 %-99 %] 97 % (02/23 0833) Weight:  [78.5 kg] 78.5 kg (02/23 0900) Last BM Date: 12/15/20 General: NAD Lungs:  CTA b/l, no w/r/r Heart:  RRR, no m/r/g Abdomen:  Soft, NT, ND, +BS   Intake/Output from previous day: 02/22 0701 - 02/23 0700 In: 2776.5 [P.O.:840; I.V.:1886.5; IV Piggyback:50] Out: -  Intake/Output this shift: Total I/O In: 240 [P.O.:240] Out: -    Lab Results: Recent Labs    12/14/20 0901 12/15/20 0432 12/16/20 0507  WBC 11.0* 7.7 5.5  HGB 11.9* 11.2* 10.4*  PLT 178 145* 153  MCV 61.5* 59.8* 59.3*   BMET Recent Labs    12/14/20 0901 12/15/20 0432 12/16/20 0507  NA 138 138 139  K 3.6 3.2* 3.1*  CL 108 107 108  CO2 19* 22 21*  GLUCOSE 146* 94 79  BUN 15 14 9   CREATININE 1.03 1.16 0.88  CALCIUM 9.0 8.3* 8.5*   LFT Recent Labs    12/14/20 0901 12/15/20 0432 12/16/20 0507  PROT 6.9 5.3* 5.5*  ALBUMIN 3.3* 2.5* 2.5*  AST 223* 107* 61*  ALT 214* 132* 95*  ALKPHOS 476* 339* 312*  BILITOT 4.5* 5.9* 2.6*  BILIDIR 2.1*  --   --    PT/INR No results for input(s): INR in the last 72 hours.    Imaging/Other results: CT Abdomen Pelvis W Contrast  Result Date: 12/14/2020 CLINICAL DATA:  Epigastric pain for 2 weeks. Concern for choledocholithiasis on ultrasound. EXAM: CT ABDOMEN AND PELVIS WITH CONTRAST TECHNIQUE: Multidetector CT imaging of the abdomen and pelvis was performed using the standard protocol following bolus administration of intravenous contrast. CONTRAST:  12/16/2020 OMNIPAQUE IOHEXOL  300 MG/ML  SOLN COMPARISON:  Ultrasound of earlier today.  CTA chest 03/15/2019. FINDINGS: Lower chest: Similar appearance of right lower lobe consolidation with bronchiectasis including on 11/05. Arterial supply off the upper abdominal aorta is more apparent on the prior CTA. Multivessel coronary artery atherosclerosis. Small hiatal hernia with fluid in the herniated stomach including on 10/03. Distal esophageal fluid as well. Hepatobiliary: Normal liver.  No calcified gallstones. Mild intrahepatic biliary duct dilatation. Moderate common duct dilatation, including at 1.5 cm on 81/7. This is followed to the level of the ampulla. Subtle hyperattenuation in this area including at 6 mm on 39/3. Pancreas: No pancreatic duct dilatation. Suspect mild peripancreatic edema, including adjacent the tail on 26/3 and the head on 31/3. Spleen: Normal in size, without focal abnormality. Adrenals/Urinary Tract: Normal adrenal glands. Normal kidneys, without hydronephrosis. Normal urinary bladder. Stomach/Bowel: Gastric body underdistention. Normal colon and terminal ileum. Normal small bowel. Vascular/Lymphatic: Aortic atherosclerosis. No abdominopelvic adenopathy. Reproductive: Normal prostate. Trace right scrotal fluid, likely physiologic. Other: No significant free fluid. Musculoskeletal: No acute osseous abnormality. IMPRESSION: 1. Intra and extrahepatic biliary duct dilatation with subtle hyperattenuation at the level of the ampulla. Equivocal for 6 mm stone versus ampullary edema. Consider further evaluation with ERCP. If preprocedure differentiation is desired, MRCP may be informative. 2. Suspicion of mild pancreatitis, lipase pending. 3. Similar right lower lobe process  compared to 03/15/2019, most consistent with pulmonary sequestration, favor intralobar type. 4. Small hiatal hernia with fluid in the herniated stomach and distal esophagus, suggesting dysmotility and/or gastroesophageal reflux. 5.  Aortic Atherosclerosis  (ICD10-I70.0). Electronically Signed   By: Jeronimo Greaves M.D.   On: 12/14/2020 12:22   US Abdomen Limited RUQ (LIVER/GB)  Result Date: 12/14/2020 CLINICAL DATA:  Right upper quadrant pain, jaundice. EXAM: ULTRASOUND ABDOMEN LIMITED RIGHT UPPER QUADRANT COMPARISON:  03/15/2019. FINDINGS: Gallbladder: Prominence of the gallbladder wall measuring up to 3.7 mm. Intraluminal calculi measuring up to 1.2 cm with sludge. Sonographic Eulah Pont sign could not be assessed secondary to premedication. Common bile duct: Diameter: 15.4 mm proximally. Overlying bowel gas limits visualization of the distal CBD. Liver: No focal lesion identified. Within normal limits in parenchymal echogenicity. Mild intrahepatic biliary dilatation. Portal vein is patent on color Doppler imaging with normal direction of blood flow towards the liver. Other: None. IMPRESSION: Cholelithiasis and intrahepatic/extrahepatic biliary dilatation is suspicious for choledocholithiasis. Cholecystitis is lower on the differential given lack of wall edema/pericholecystic free fluid. Sonographic Eulah Pont sign could not be assessed secondary to premedication. Electronically Signed   By: Stana Bunting M.D.   On: 12/14/2020 10:58      Assessment and Plan:  1) Cholelithiasis 2) Dilated CBD 3) Biliary pancreatitis Liver enzymes continue to downtrend, with AST/ALT/ALP 61/95/312 (from 107/132/339 yesterday) along with downtrending T bili at 2.6 today (from 5.9 yesterday). -Discussed with surgical service and plan for lap ccy with IOC tomorrow. -GI service will remain available as needed.  Please contact us if IOC positive and we can make arrangements for ERCP  4) CAD 5) Dual antiplatelet therapy -Holding Plavix in anticipation of surgery tomorrow -Evaluated by Cardiology service yesterday with plan to restart DAPT postop as it is possible    Shellia Cleverly, DO  12/16/2020, 10:16 AM [facility] Pager [phone]

## 2020-12-16 NOTE — Progress Notes (Signed)
Review POC for procedure in AM. Patient verbalizes to be NPO after MN. Consent signed and placed in chart.

## 2020-12-16 NOTE — Progress Notes (Signed)
Progress Note  Patient Name: Martin Mcdowell Date of Encounter: 12/16/2020  [facility] Cardiologist: Lesleigh Noe, MD   Subjective   Feeling well.  No chest pain or shortness of breath.  Inpatient Medications    Scheduled Meds: . aspirin EC  81 mg Oral Daily  . atorvastatin  80 mg Oral Daily  . metoprolol succinate  25 mg Oral Daily  . pantoprazole  40 mg Oral Daily   Continuous Infusions: . cefTRIAXone (ROCEPHIN)  IV    . lactated ringers 75 mL/hr at 12/16/20 0948   PRN Meds: acetaminophen **OR** acetaminophen, hydrALAZINE, morphine injection, ondansetron **OR** ondansetron (ZOFRAN) IV, oxyCODONE   Vital Signs    Vitals:   12/15/20 2316 12/16/20 0505 12/16/20 0833 12/16/20 0900  BP: 107/75 100/73 109/78   Pulse: 78 79 72   Resp: 18 16 16    Temp: 99.2 F (37.3 C) 98.8 F (37.1 C) 98.6 F (37 C)   TempSrc: Oral Oral Oral   SpO2: 96% 96% 97%   Weight:    78.5 kg  Height:         Intake/Output Summary (Last 24 hours) at 12/16/2020 1037 Last data filed at 12/16/2020 0800 Gross per 24 hour  Intake 2606.49 ml  Output --  Net 2606.49 ml   Last 3 Weights 12/16/2020 12/14/2020 10/14/2020  Weight (lbs) 173 lb 1.6 oz 182 lb 12.2 oz 179 lb 12.8 oz  Weight (kg) 78.518 kg 82.9 kg 81.557 kg      Telemetry    N/A- Personally Reviewed  ECG    n/a - Personally Reviewed  Physical Exam   VS:  BP 109/78 (BP Location: Right Arm)   Pulse 72   Temp 98.6 F (37 C) (Oral)   Resp 16   Ht 5\' 7"  (1.702 m)   Wt 78.5 kg   SpO2 97%   BMI 27.11 kg/m  , BMI Body mass index is 27.11 kg/m. GENERAL:  Well appearing HEENT: Pupils equal round and reactive, fundi not visualized, oral mucosa unremarkable NECK:  No jugular venous distention, waveform within normal limits, carotid upstroke brisk and symmetric, no bruits LUNGS:  Clear to auscultation bilaterally HEART:  RRR.  PMI not displaced or sustained,S1 and S2 within normal limits, no S3, no S4, no clicks, no rubs, no  murmurs ABD:  Flat, positive bowel sounds normal in frequency in pitch, no bruits, no rebound, no guarding, no midline pulsatile mass, no hepatomegaly, no splenomegaly EXT:  2 plus pulses throughout, no edema, no cyanosis no clubbing SKIN:  No rashes no nodules NEURO:  Cranial nerves II through XII grossly intact, motor grossly intact throughout PSYCH:  Cognitively intact, oriented to person place and time   Labs    High Sensitivity Troponin:   Recent Labs  Lab 12/14/20 0901 12/14/20 1116  TROPONINIHS 16 14      Chemistry Recent Labs  Lab 12/14/20 0901 12/15/20 0432 12/16/20 0507  NA 138 138 139  K 3.6 3.2* 3.1*  CL 108 107 108  CO2 19* 22 21*  GLUCOSE 146* 94 79  BUN 15 14 9   CREATININE 1.03 1.16 0.88  CALCIUM 9.0 8.3* 8.5*  PROT 6.9 5.3* 5.5*  ALBUMIN 3.3* 2.5* 2.5*  AST 223* 107* 61*  ALT 214* 132* 95*  ALKPHOS 476* 339* 312*  BILITOT 4.5* 5.9* 2.6*  GFRNONAA >60 >60 >60  ANIONGAP 11 9 10      Hematology Recent Labs  Lab 12/14/20 0901 12/15/20 0432 12/16/20 0507  WBC  11.0* 7.7 5.5  RBC 6.36* 5.70 5.31  HGB 11.9* 11.2* 10.4*  HCT 39.1 34.1* 31.5*  MCV 61.5* 59.8* 59.3*  MCH 18.7* 19.6* 19.6*  MCHC 30.4 32.8 33.0  RDW 17.9* 17.7* 16.6*  PLT 178 145* 153    BNPNo results for input(s): BNP, PROBNP in the last 168 hours.   DDimer No results for input(s): DDIMER in the last 168 hours.   Radiology    CT Abdomen Pelvis W Contrast  Result Date: 12/14/2020 CLINICAL DATA:  Epigastric pain for 2 weeks. Concern for choledocholithiasis on ultrasound. EXAM: CT ABDOMEN AND PELVIS WITH CONTRAST TECHNIQUE: Multidetector CT imaging of the abdomen and pelvis was performed using the standard protocol following bolus administration of intravenous contrast. CONTRAST:  OMNIPAQUE IOHEXOL 300 MG/ML  SOLN COMPARISON:  Ultrasound of earlier today.  CTA chest 03/15/2019. FINDINGS: Lower chest: Similar appearance of right lower lobe consolidation with bronchiectasis  including on 11/05. Arterial supply off the upper abdominal aorta is more apparent on the prior CTA. Multivessel coronary artery atherosclerosis. Small hiatal hernia with fluid in the herniated stomach including on 10/03. Distal esophageal fluid as well. Hepatobiliary: Normal liver.  No calcified gallstones. Mild intrahepatic biliary duct dilatation. Moderate common duct dilatation, including at 1.5 cm on 81/7. This is followed to the level of the ampulla. Subtle hyperattenuation in this area including at 6 mm on 39/3. Pancreas: No pancreatic duct dilatation. Suspect mild peripancreatic edema, including adjacent the tail on 26/3 and the head on 31/3. Spleen: Normal in size, without focal abnormality. Adrenals/Urinary Tract: Normal adrenal glands. Normal kidneys, without hydronephrosis. Normal urinary bladder. Stomach/Bowel: Gastric body underdistention. Normal colon and terminal ileum. Normal small bowel. Vascular/Lymphatic: Aortic atherosclerosis. No abdominopelvic adenopathy. Reproductive: Normal prostate. Trace right scrotal fluid, likely physiologic. Other: No significant free fluid. Musculoskeletal: No acute osseous abnormality. IMPRESSION: 1. Intra and extrahepatic biliary duct dilatation with subtle hyperattenuation at the level of the ampulla. Equivocal for 6 mm stone versus ampullary edema. Consider further evaluation with ERCP. If preprocedure differentiation is desired, MRCP may be informative. 2. Suspicion of mild pancreatitis, lipase pending. 3. Similar right lower lobe process compared to 03/15/2019, most consistent with pulmonary sequestration, favor intralobar type. 4. Small hiatal hernia with fluid in the herniated stomach and distal esophagus, suggesting dysmotility and/or gastroesophageal reflux. 5.  Aortic Atherosclerosis (ICD10-I70.0). Electronically Signed   By: Jeronimo Greaves M.D.   On: 12/14/2020 12:22   US Abdomen Limited RUQ (LIVER/GB)  Result Date: 12/14/2020 CLINICAL DATA:  Right upper  quadrant pain, jaundice. EXAM: ULTRASOUND ABDOMEN LIMITED RIGHT UPPER QUADRANT COMPARISON:  03/15/2019. FINDINGS: Gallbladder: Prominence of the gallbladder wall measuring up to 3.7 mm. Intraluminal calculi measuring up to 1.2 cm with sludge. Sonographic Eulah Pont sign could not be assessed secondary to premedication. Common bile duct: Diameter: 15.4 mm proximally. Overlying bowel gas limits visualization of the distal CBD. Liver: No focal lesion identified. Within normal limits in parenchymal echogenicity. Mild intrahepatic biliary dilatation. Portal vein is patent on color Doppler imaging with normal direction of blood flow towards the liver. Other: None. IMPRESSION: Cholelithiasis and intrahepatic/extrahepatic biliary dilatation is suspicious for choledocholithiasis. Cholecystitis is lower on the differential given lack of wall edema/pericholecystic free fluid. Sonographic Eulah Pont sign could not be assessed secondary to premedication. Electronically Signed   By: Stana Bunting M.D.   On: 12/14/2020 10:58    Cardiac Studies   Echo 07/31/20:  1. Left ventricular ejection fraction, by estimation, is 35 to 40%. The  left ventricle has moderately decreased  function. The left ventricle  demonstrates regional wall motion abnormalities (see scoring  diagram/findings for description). Left ventricular  diastolic parameters are consistent with Grade II diastolic dysfunction  (pseudonormalization). Elevated left atrial pressure.  2. There is significant swirling artifact at LV apex on Definity images,  but no clear thrombus seen  3. Right ventricular systolic function is normal. The right ventricular  size is normal. Tricuspid regurgitation signal is inadequate for assessing  PA pressure.  4. The mitral valve is normal in structure. Trivial mitral valve  regurgitation.  5. The aortic valve is tricuspid. Aortic valve regurgitation is trivial.  Mild aortic valve sclerosis is present, with no  evidence of aortic valve  stenosis.  6. Possible PFO, not well visualized.  7. 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  8. The inferior vena cava is normal in size with greater than 50%  respiratory variability, suggesting right atrial pressure of 3 mmHg.   Patient Profile     70 y.o. male with CAD s/p anterior STEMI 04/2020, multivessel PCI, chronic systolic diastolic heart failure (LVEF 35 to 40%), hypertension, hyperlipidemia and DRESS syndrome admitted with gallstone pancreatitis.     Assessment & Plan    # chronic systolic and diastolic heart failure: LVEF 35 to 40%.  He is euvolemic on exam.  IV fluids reduced to 75 mL's per hour.  Blood pressure remains low so we are holding Entresto.  Continue metoprolol.  Okay to give unless SBP is less than 90.  # CAD status post anterior STEMI 04/2020: Multivessel PCI 7 months ago.  Clopidogrel on hold for planned ERCP and cholecystectomy tomorrow.  Resume as soon as possible postoperatively.  Continue aspirin, atorvastatin, and metoprolol.      For questions or updates, please contact [facility] Please consult [email or web address] for contact info under        Signed, Chilton Si, MD  12/16/2020, 10:37 AM

## 2020-12-16 NOTE — Progress Notes (Signed)
Subjective: Feeling better.  Had some RUQ abdominal pain last night but resolved this am.  Just got out of shower.  No nausea  ROS: See above, otherwise other systems negative  Objective: Vital signs in last 24 hours: Temp:  [98.6 F (37 C)-99.3 F (37.4 C)] 98.6 F (37 C) (02/23 0833) Pulse Rate:  [70-79] 72 (02/23 0833) Resp:  [16-18] 16 (02/23 0833) BP: (91-109)/(59-78) 109/78 (02/23 0833) SpO2:  [96 %-99 %] 97 % (02/23 0833) Weight:  [78.5 kg] 78.5 kg (02/23 0900) Last BM Date: 12/15/20  Intake/Output from previous day: 02/22 0701 - 02/23 0700 In: 2776.5 [P.O.:840; I.V.:1886.5; IV Piggyback:50] Out: -  Intake/Output this shift: Total I/O In: 240 [P.O.:240] Out: -   PE: Abd: soft, non tender, +BS, ND Psych: A&Ox3  Lab Results:  Recent Labs    12/15/20 0432 12/16/20 0507  WBC 7.7 5.5  HGB 11.2* 10.4*  HCT 34.1* 31.5*  PLT 145* 153   BMET Recent Labs    12/15/20 0432 12/16/20 0507  NA 138 139  K 3.2* 3.1*  CL 107 108  CO2 22 21*  GLUCOSE 94 79  BUN 14 9  CREATININE 1.16 0.88  CALCIUM 8.3* 8.5*   PT/INR No results for input(s): LABPROT, INR in the last 72 hours. CMP     Component Value Date/Time   NA 139 12/16/2020 0507   NA 140 10/14/2020 1150   K 3.1 (L) 12/16/2020 0507   CL 108 12/16/2020 0507   CO2 21 (L) 12/16/2020 0507   GLUCOSE 79 12/16/2020 0507   BUN 9 12/16/2020 0507   BUN 17 10/14/2020 1150   CREATININE 0.88 12/16/2020 0507   CREATININE 0.79 10/29/2014 1310   CALCIUM 8.5 (L) 12/16/2020 0507   PROT 5.5 (L) 12/16/2020 0507   PROT 5.8 (L) 06/25/2020 0945   ALBUMIN 2.5 (L) 12/16/2020 0507   ALBUMIN 3.7 (L) 06/25/2020 0945   AST 61 (H) 12/16/2020 0507   ALT 95 (H) 12/16/2020 0507   ALKPHOS 312 (H) 12/16/2020 0507   BILITOT 2.6 (H) 12/16/2020 0507   BILITOT 1.3 (H) 06/25/2020 0945   GFRNONAA >60 12/16/2020 0507   GFRAA 87 10/14/2020 1150   Lipase     Component Value Date/Time   LIPASE 601 (H) 12/15/2020 0432        Studies/Results: CT Abdomen Pelvis W Contrast  Result Date: 12/14/2020 CLINICAL DATA:  Epigastric pain for 2 weeks. Concern for choledocholithiasis on ultrasound. EXAM: CT ABDOMEN AND PELVIS WITH CONTRAST TECHNIQUE: Multidetector CT imaging of the abdomen and pelvis was performed using the standard protocol following bolus administration of intravenous contrast. CONTRAST:  OMNIPAQUE IOHEXOL 300 MG/ML  SOLN COMPARISON:  Ultrasound of earlier today.  CTA chest 03/15/2019. FINDINGS: Lower chest: Similar appearance of right lower lobe consolidation with bronchiectasis including on 11/05. Arterial supply off the upper abdominal aorta is more apparent on the prior CTA. Multivessel coronary artery atherosclerosis. Small hiatal hernia with fluid in the herniated stomach including on 10/03. Distal esophageal fluid as well. Hepatobiliary: Normal liver.  No calcified gallstones. Mild intrahepatic biliary duct dilatation. Moderate common duct dilatation, including at 1.5 cm on 81/7. This is followed to the level of the ampulla. Subtle hyperattenuation in this area including at 6 mm on 39/3. Pancreas: No pancreatic duct dilatation. Suspect mild peripancreatic edema, including adjacent the tail on 26/3 and the head on 31/3. Spleen: Normal in size, without focal abnormality. Adrenals/Urinary Tract: Normal adrenal glands. Normal kidneys, without hydronephrosis.  Normal urinary bladder. Stomach/Bowel: Gastric body underdistention. Normal colon and terminal ileum. Normal small bowel. Vascular/Lymphatic: Aortic atherosclerosis. No abdominopelvic adenopathy. Reproductive: Normal prostate. Trace right scrotal fluid, likely physiologic. Other: No significant free fluid. Musculoskeletal: No acute osseous abnormality. IMPRESSION: 1. Intra and extrahepatic biliary duct dilatation with subtle hyperattenuation at the level of the ampulla. Equivocal for 6 mm stone versus ampullary edema. Consider further evaluation with  ERCP. If preprocedure differentiation is desired, MRCP may be informative. 2. Suspicion of mild pancreatitis, lipase pending. 3. Similar right lower lobe process compared to 03/15/2019, most consistent with pulmonary sequestration, favor intralobar type. 4. Small hiatal hernia with fluid in the herniated stomach and distal esophagus, suggesting dysmotility and/or gastroesophageal reflux. 5.  Aortic Atherosclerosis (ICD10-I70.0). Electronically Signed   By: Jeronimo Greaves M.D.   On: 12/14/2020 12:22   US Abdomen Limited RUQ (LIVER/GB)  Result Date: 12/14/2020 CLINICAL DATA:  Right upper quadrant pain, jaundice. EXAM: ULTRASOUND ABDOMEN LIMITED RIGHT UPPER QUADRANT COMPARISON:  03/15/2019. FINDINGS: Gallbladder: Prominence of the gallbladder wall measuring up to 3.7 mm. Intraluminal calculi measuring up to 1.2 cm with sludge. Sonographic Eulah Pont sign could not be assessed secondary to premedication. Common bile duct: Diameter: 15.4 mm proximally. Overlying bowel gas limits visualization of the distal CBD. Liver: No focal lesion identified. Within normal limits in parenchymal echogenicity. Mild intrahepatic biliary dilatation. Portal vein is patent on color Doppler imaging with normal direction of blood flow towards the liver. Other: None. IMPRESSION: Cholelithiasis and intrahepatic/extrahepatic biliary dilatation is suspicious for choledocholithiasis. Cholecystitis is lower on the differential given lack of wall edema/pericholecystic free fluid. Sonographic Eulah Pont sign could not be assessed secondary to premedication. Electronically Signed   By: Stana Bunting M.D.   On: 12/14/2020 10:58    Anti-infectives: Anti-infectives (From admission, onward)   Start     Dose/Rate Route Frequency Ordered Stop   12/16/20 1045  cefTRIAXone (ROCEPHIN) 2 g in sodium chloride 0.9 % 100 mL IVPB       Note to Pharmacy: Pharmacy may adjust dosing strength / duration / interval for maximal efficacy   2 g 200 mL/hr over 30  Minutes Intravenous To Short Stay 12/16/20 1033 12/17/20 1045   12/14/20 1500  piperacillin-tazobactam (ZOSYN) IVPB 3.375 g  Status:  Discontinued        3.375 g 12.5 mL/hr over 240 Minutes Intravenous Every 8 hours 12/14/20 1419 12/15/20 1028       Assessment/Plan CAD/ hx MI 04/2020 requiring stent placement on aspirin and plavix (last dose 12/14/20 at 0800) CHF (EF 35-40% from ECHO 07/2020) - preop cardiac clearance recommended GERD HTN HLD Hx DRESS syndrome from bactrim  Choledocholithiasis with pancreatitis - TB dow today to 2 from 5 yesterday.  LFTs trending down. -recheck in am, but if continues to trend down or stable, will proceed with lap chole with IOC tomorrow and hold on MRCP/ERCP unless IOC +.  D/W Dr. Barron Alvine. -CLD today and NPO p MN -cleared by cardiology -I have explained the procedure, risks, and aftercare of cholecystectomy.  Risks include but are not limited to bleeding, infection, wound problems, anesthesia, diarrhea, bile leak, injury to common bile duct/liver/intestine.  He seems to understand and agrees to proceed.   ID - Rocephin on call to OR VTE - SCDs, Plavix on hold FEN - IVF, CLD, NPO p MN Foley - none Follow up - TBD   LOS: 2 days    Letha Cape , [facility] 12/16/2020, 10:37 AM Please see Amion for  pager number during day hours 7:00am-4:30pm or 7:00am -11:30am on weekends

## 2020-12-16 NOTE — Progress Notes (Signed)
PROGRESS NOTE  Martin Mcdowell YOV:[id number] DOB: September 16, 1951 DOA: 12/14/2020 PCP: Karl Ito, DO   LOS: 2 days   Brief Narrative / Interim history: 70 year old male with history of toxoplasma chorioretinitis, chronic systolic CHF, coronary artery disease with stenting in June 2021, history of dress syndrome, hyperlipidemia comes to the hospital with abdominal pain, nausea and vomiting.  This has been on and off for the past 3 weeks but became more acute and decided to come to the hospital.  He was found to have acute gallstone pancreatitis.  He was placed on fluids, GI and general surgery were consulted.  Subjective / 24h Interval events: Up walking the hallways   Assessment & Plan:  Acute pancreatitis with choledocholithiasis  -patient with 3 weeks of abdominal pain, possibly worse after eating.  Was found to have elevation in his lipase on admission and ultrasound showed cholelithiasis with biliary dilatation suggestive of choledocholithiasis.   -General surgery and gastroenterology following.   -last dose of plavix was on 2/20 1 AM prior to coming to the hospital.   -laparoscopic cholecystectomy  Coronary artery disease, prior MI with stents in June 2021, chronic systolic CHF -patient appears euvolemic on exam.  Most recent EF in August 12 2134-40% and grade 2 diastolic dysfunction.  Continue aspirin, statin, beta-blockers as well as Entresto as blood pressure tolerates.  Hold Plavix and continue aspirin alone.  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 aspirin EC  81 mg Oral Daily  .  atorvastatin  80 mg Oral Daily  . [START ON 12/17/2020] metoprolol succinate  25 mg Oral Daily  . pantoprazole  40 mg Oral Daily   Continuous Infusions: . cefTRIAXone (ROCEPHIN)  IV    . lactated ringers 75 mL/hr at 12/16/20 0948   PRN Meds:.acetaminophen **OR** acetaminophen, hydrALAZINE, morphine injection, ondansetron **OR** ondansetron (ZOFRAN) IV, oxyCODONE  Diet Orders (From admission, onward)    Start     Ordered   12/17/20 0001  Diet NPO time specified Except for: Sips with Meds, Ice Chips  Diet effective midnight       Question Answer Comment  Except for Sips with Meds   Except for Ice Chips      12/16/20 1032   12/14/20 1334  Diet clear liquid Room service appropriate? Yes; Fluid consistency: Thin  Diet effective now       Question Answer Comment  Room service appropriate? Yes   Fluid consistency: Thin      12/14/20 1333          DVT prophylaxis: SCDs Start: 12/14/20 1418     Code Status: DNR  Family Communication: no family at bedside   Status is: Inpatient  Remains inpatient appropriate because:Inpatient level of care appropriate due to severity of illness   Dispo: The patient is from: Home              Anticipated d/c is to: Home              Anticipated d/c date is: 2 days              Patient currently is not medically stable to d/c.  Difficult to place patient No   Level of care: Med-Surg  Consultants:  General surgery Gastroenterology   Antimicrobials: Zosyn 2/21 >>    Objective: Vitals:   12/15/20 2316 12/16/20 0505 12/16/20 0833 12/16/20 0900  BP: 107/75 100/73 109/78   Pulse: 78 79 72   Resp: 18 16 16    Temp: 99.2 F (37.3 C) 98.8 F (37.1 C) 98.6 F (37 C)   TempSrc: Oral Oral Oral   SpO2: 96% 96% 97%   Weight:    78.5 kg  Height:        Intake/Output Summary (Last 24 hours) at 12/16/2020 1209 Last data filed at 12/16/2020 0800 Gross per 24 hour  Intake 1302.5 ml  Output --  Net 1302.5 ml   Filed Weights   12/14/20  1548 12/16/20 0900  Weight: 82.9 kg 78.5 kg    Examination:   General: Appearance:     Overweight male in no acute distress     Lungs:     respirations unlabored  Heart:    Normal heart rate. Normal rhythm. No murmurs, rubs, or gallops.   MS:   All extremities are intact.   Neurologic:   Awake, alert, oriented x 3. No apparent focal neurological           defect.    Data Reviewed: I have independently reviewed following labs and imaging studies   CBC: Recent Labs  Lab 12/14/20 0901 12/15/20 0432 12/16/20 0507  WBC 11.0* 7.7 5.5  HGB 11.9* 11.2* 10.4*  HCT 39.1 34.1* 31.5*  MCV 61.5* 59.8* 59.3*  PLT 178 145* 153   Basic Metabolic Panel: Recent Labs  Lab 12/14/20 0901 12/15/20 0432 12/16/20 0507  NA 138 138 139  K 3.6 3.2* 3.1*  CL 108 107 108  CO2 19* 22 21*  GLUCOSE 146* 94 79  BUN 15 14 9   CREATININE 1.03 1.16 0.88  CALCIUM 9.0 8.3* 8.5*   Liver Function Tests: Recent Labs  Lab 12/14/20 0901 12/15/20 0432 12/16/20 0507  AST 223* 107* 61*  ALT 214* 132* 95*  ALKPHOS 476* 339* 312*  BILITOT 4.5* 5.9* 2.6*  PROT 6.9 5.3* 5.5*  ALBUMIN 3.3* 2.5* 2.5*   Coagulation Profile: No results for input(s): INR, PROTIME in the last 168 hours. HbA1C: No results for input(s): HGBA1C in the last 72 hours. CBG: No results for input(s): GLUCAP in the last 168 hours.  Recent Results (from the past 240 hour(s))  Resp Panel by RT-PCR (Flu A&B, Covid) Nasopharyngeal Swab     Status: None   Collection Time: 12/14/20 11:16 AM   Specimen: Nasopharyngeal Swab; Nasopharyngeal(NP) swabs in vial transport medium  Result Value Ref Range Status   SARS Coronavirus 2 by RT PCR NEGATIVE NEGATIVE Final    Comment: (NOTE) SARS-CoV-2 target nucleic acids are NOT DETECTED.  The SARS-CoV-2 RNA is generally detectable in upper respiratory specimens during the acute phase of infection. The lowest concentration of SARS-CoV-2 viral copies this assay can detect is 138 copies/mL. A  negative result does not preclude SARS-Cov-2 infection and should not be used as the sole basis for treatment or other patient management decisions. A negative result may occur with  improper specimen collection/handling, submission of specimen other than nasopharyngeal swab, presence of viral mutation(s) within the areas targeted by this assay, and inadequate number of viral copies(<138 copies/mL). A negative result must be combined with clinical observations, patient history, and epidemiological information. The expected result is Negative.  Fact Sheet for Patients:  12/18/20  Fact Sheet for Healthcare Providers:  SeriousBroker.it  This test is no t yet approved or cleared by the [place] FDA and  has been authorized for detection and/or diagnosis of SARS-CoV-2 by FDA under an Emergency Use Authorization (EUA). This EUA will remain  in effect (meaning this test can be used) for the duration of the COVID-19 declaration under Section 564(b)(1) of the Act, 21 U.S.C.section 360bbb-3(b)(1), unless the authorization is terminated  or revoked sooner.       Influenza A by PCR NEGATIVE NEGATIVE Final   Influenza B by PCR NEGATIVE NEGATIVE Final    Comment: (NOTE) The Xpert Xpress SARS-CoV-2/FLU/RSV plus assay is intended as an aid in the diagnosis of influenza from Nasopharyngeal swab specimens and should not be used as a sole basis for treatment. Nasal washings and aspirates are unacceptable for Xpert Xpress SARS-CoV-2/FLU/RSV testing.  Fact Sheet for Patients: BloggerCourse.com  Fact Sheet for Healthcare Providers: SeriousBroker.it  This test is not yet approved or cleared by the [place] FDA and has been authorized for detection and/or diagnosis of SARS-CoV-2 by FDA under an Emergency Use Authorization (EUA). This EUA will remain in effect (meaning this test can  be used) for the duration of the COVID-19 declaration under Section 564(b)(1) of the Act, 21 U.S.C. section 360bbb-3(b)(1), unless the authorization is terminated or revoked.  Performed at [facility], [address], [place], [place] [address]      Radiology Studies: No results found.  Marlin Canary DO [facility]  Between 7 am - 7 pm I am available, please contact me via Amion or Securechat  Between 7 pm - 7 am I am not available, please contact night coverage MD/APP via Amion

## 2020-12-17 ENCOUNTER — Encounter (HOSPITAL_COMMUNITY)
Admission: EM | Disposition: A | Discharge status: Home / Self Care | Payer: Self-pay | Source: Home / Self Care | Attending: Internal Medicine

## 2020-12-17 ENCOUNTER — Encounter (HOSPITAL_COMMUNITY): Payer: Self-pay | Admitting: Internal Medicine

## 2020-12-17 ENCOUNTER — Inpatient Hospital Stay (HOSPITAL_COMMUNITY): Payer: Medicare Other

## 2020-12-17 ENCOUNTER — Inpatient Hospital Stay (HOSPITAL_COMMUNITY): Payer: Medicare Other | Admitting: Anesthesiology

## 2020-12-17 DIAGNOSIS — K8042 Calculus of bile duct with acute cholecystitis without obstruction: Secondary | ICD-10-CM | POA: Diagnosis not present

## 2020-12-17 HISTORY — PX: CHOLECYSTECTOMY: SHX55

## 2020-12-17 LAB — CBC
HCT: 31.4 % — ABNORMAL LOW (ref 39.0–52.0)
Hemoglobin: 10.2 g/dL — ABNORMAL LOW (ref 13.0–17.0)
MCH: 19.2 pg — ABNORMAL LOW (ref 26.0–34.0)
MCHC: 32.5 g/dL (ref 30.0–36.0)
MCV: 59 fL — ABNORMAL LOW (ref 80.0–100.0)
Platelets: 151 10*3/uL (ref 150–400)
RBC: 5.32 MIL/uL (ref 4.22–5.81)
RDW: 16.4 % — ABNORMAL HIGH (ref 11.5–15.5)
WBC: 4.5 10*3/uL (ref 4.0–10.5)
nRBC: 0 % (ref 0.0–0.2)

## 2020-12-17 LAB — COMPREHENSIVE METABOLIC PANEL
ALT: 79 U/L — ABNORMAL HIGH (ref 0–44)
AST: 50 U/L — ABNORMAL HIGH (ref 15–41)
Albumin: 2.5 g/dL — ABNORMAL LOW (ref 3.5–5.0)
Alkaline Phosphatase: 284 U/L — ABNORMAL HIGH (ref 38–126)
Anion gap: 10 (ref 5–15)
BUN: 9 mg/dL (ref 8–23)
CO2: 21 mmol/L — ABNORMAL LOW (ref 22–32)
Calcium: 8.6 mg/dL — ABNORMAL LOW (ref 8.9–10.3)
Chloride: 107 mmol/L (ref 98–111)
Creatinine, Ser: 0.87 mg/dL (ref 0.61–1.24)
GFR, Estimated: >60 mL/min (ref >60)
Glucose, Bld: 93 mg/dL (ref 70–99)
Potassium: 3.5 mmol/L (ref 3.5–5.1)
Sodium: 138 mmol/L (ref 135–145)
Total Bilirubin: 2.1 mg/dL — ABNORMAL HIGH (ref 0.3–1.2)
Total Protein: 5.6 g/dL — ABNORMAL LOW (ref 6.5–8.1)

## 2020-12-17 LAB — LIPASE, BLOOD: Lipase: 163 U/L — ABNORMAL HIGH (ref 11–51)

## 2020-12-17 LAB — SURGICAL PCR SCREEN
MRSA, PCR: NEGATIVE
Staphylococcus aureus: NEGATIVE

## 2020-12-17 SURGERY — LAPAROSCOPIC CHOLECYSTECTOMY WITH INTRAOPERATIVE CHOLANGIOGRAM
Anesthesia: General | Site: Abdomen

## 2020-12-17 MED ORDER — FENTANYL CITRATE (PF) 100 MCG/2ML IJ SOLN
25.0000 ug | INTRAMUSCULAR | Status: DC | PRN
  Administered 2020-12-17 (×4): 25 ug via INTRAVENOUS

## 2020-12-17 MED ORDER — ROCURONIUM BROMIDE 10 MG/ML (PF) SYRINGE
PREFILLED_SYRINGE | INTRAVENOUS | Status: DC | PRN
  Administered 2020-12-17: 20 mg via INTRAVENOUS
  Administered 2020-12-17: 60 mg via INTRAVENOUS

## 2020-12-17 MED ORDER — CHLORHEXIDINE GLUCONATE 0.12 % MT SOLN
15.0000 mL | OROMUCOSAL | Status: AC
  Filled 2020-12-17: qty 15

## 2020-12-17 MED ORDER — SUGAMMADEX SODIUM 200 MG/2ML IV SOLN
INTRAVENOUS | Status: DC | PRN
  Administered 2020-12-17 (×3): 100 mg via INTRAVENOUS

## 2020-12-17 MED ORDER — LIDOCAINE 2% (20 MG/ML) 5 ML SYRINGE
INTRAMUSCULAR | Status: DC | PRN
  Administered 2020-12-17: 100 mg via INTRAVENOUS

## 2020-12-17 MED ORDER — FENTANYL CITRATE (PF) 100 MCG/2ML IJ SOLN
INTRAMUSCULAR | Status: AC
  Filled 2020-12-17: qty 2

## 2020-12-17 MED ORDER — SUCCINYLCHOLINE CHLORIDE 200 MG/10ML IV SOSY
PREFILLED_SYRINGE | INTRAVENOUS | Status: DC | PRN
  Administered 2020-12-17: 80 mg via INTRAVENOUS

## 2020-12-17 MED ORDER — ONDANSETRON HCL 4 MG/2ML IJ SOLN
INTRAMUSCULAR | Status: DC | PRN
  Administered 2020-12-17: 4 mg via INTRAVENOUS

## 2020-12-17 MED ORDER — ACETAMINOPHEN 500 MG PO TABS
1000.0000 mg | ORAL_TABLET | ORAL | Status: AC
  Administered 2020-12-17: 1000 mg via ORAL
  Filled 2020-12-17: qty 2

## 2020-12-17 MED ORDER — PROPOFOL 10 MG/ML IV BOLUS
INTRAVENOUS | Status: AC
  Filled 2020-12-17: qty 40

## 2020-12-17 MED ORDER — ONDANSETRON HCL 4 MG/2ML IJ SOLN
INTRAMUSCULAR | Status: AC
  Filled 2020-12-17: qty 2

## 2020-12-17 MED ORDER — SODIUM CHLORIDE 0.9 % IV SOLN
INTRAVENOUS | Status: DC | PRN
  Administered 2020-12-17: 20 mL

## 2020-12-17 MED ORDER — BUPIVACAINE HCL (PF) 0.25 % IJ SOLN
INTRAMUSCULAR | Status: AC
  Filled 2020-12-17: qty 30

## 2020-12-17 MED ORDER — CHLORHEXIDINE GLUCONATE 0.12 % MT SOLN
OROMUCOSAL | Status: AC
  Administered 2020-12-17: 15 mL via OROMUCOSAL
  Filled 2020-12-17: qty 15

## 2020-12-17 MED ORDER — ONDANSETRON HCL 4 MG/2ML IJ SOLN
4.0000 mg | Freq: Once | INTRAMUSCULAR | Status: AC | PRN
  Administered 2020-12-17: 4 mg via INTRAVENOUS

## 2020-12-17 MED ORDER — FENTANYL CITRATE (PF) 250 MCG/5ML IJ SOLN
INTRAMUSCULAR | Status: AC
  Filled 2020-12-17: qty 5

## 2020-12-17 MED ORDER — LACTATED RINGERS IV SOLN: INTRAVENOUS | Status: DC

## 2020-12-17 MED ORDER — DEXAMETHASONE SODIUM PHOSPHATE 10 MG/ML IJ SOLN
INTRAMUSCULAR | Status: DC | PRN
  Administered 2020-12-17: 10 mg via INTRAVENOUS

## 2020-12-17 MED ORDER — FENTANYL CITRATE (PF) 250 MCG/5ML IJ SOLN
INTRAMUSCULAR | Status: DC | PRN
  Administered 2020-12-17: 100 ug via INTRAVENOUS
  Administered 2020-12-17: 50 ug via INTRAVENOUS

## 2020-12-17 MED ORDER — AMISULPRIDE (ANTIEMETIC) 5 MG/2ML IV SOLN: 10.0000 mg | Freq: Once | INTRAVENOUS | Status: DC | PRN

## 2020-12-17 MED ORDER — ACETAMINOPHEN 325 MG PO TABS
650.0000 mg | ORAL_TABLET | Freq: Four times a day (QID) | ORAL | Status: DC
  Administered 2020-12-17 – 2020-12-18 (×4): 650 mg via ORAL
  Filled 2020-12-17 (×4): qty 2

## 2020-12-17 MED ORDER — MIDAZOLAM HCL 2 MG/2ML IJ SOLN
INTRAMUSCULAR | Status: AC
  Filled 2020-12-17: qty 2

## 2020-12-17 MED ORDER — PROPOFOL 10 MG/ML IV BOLUS
INTRAVENOUS | Status: DC | PRN
  Administered 2020-12-17: 120 mg via INTRAVENOUS

## 2020-12-17 MED ORDER — ALBUMIN HUMAN 5 % IV SOLN: INTRAVENOUS | Status: DC | PRN

## 2020-12-17 MED ORDER — SODIUM CHLORIDE 0.9 % IR SOLN
Status: DC | PRN
  Administered 2020-12-17: 1000 mL

## 2020-12-17 MED ORDER — BUPIVACAINE HCL 0.25 % IJ SOLN
INTRAMUSCULAR | Status: DC | PRN
  Administered 2020-12-17: 30 mL

## 2020-12-17 MED ORDER — PHENYLEPHRINE 40 MCG/ML (10ML) SYRINGE FOR IV PUSH (FOR BLOOD PRESSURE SUPPORT)
PREFILLED_SYRINGE | INTRAVENOUS | Status: DC | PRN
  Administered 2020-12-17: 80 ug via INTRAVENOUS

## 2020-12-17 MED ORDER — LIDOCAINE 5 % EX PTCH
1.0000 | MEDICATED_PATCH | CUTANEOUS | Status: DC
  Administered 2020-12-17 – 2020-12-18 (×2): 1 via TRANSDERMAL
  Filled 2020-12-17 (×2): qty 1

## 2020-12-17 MED ORDER — OXYCODONE HCL 5 MG PO TABS: 5.0000 mg | ORAL_TABLET | Freq: Once | ORAL | Status: DC | PRN

## 2020-12-17 MED ORDER — OXYCODONE HCL 5 MG/5ML PO SOLN: 5.0000 mg | Freq: Once | ORAL | Status: DC | PRN

## 2020-12-17 MED ORDER — 0.9 % SODIUM CHLORIDE (POUR BTL) OPTIME
TOPICAL | Status: DC | PRN
  Administered 2020-12-17: 1000 mL

## 2020-12-17 MED ORDER — HEMOSTATIC AGENTS (NO CHARGE) OPTIME
TOPICAL | Status: DC | PRN
  Administered 2020-12-17: 1 via TOPICAL

## 2020-12-17 SURGICAL SUPPLY — 49 items
APPLIER CLIP ROT 10 11.4 M/L (STAPLE)
BIOPATCH RED 1 DISK 7.0 (GAUZE/BANDAGES/DRESSINGS) ×2 IMPLANT
BLADE CLIPPER SURG (BLADE) IMPLANT
CANISTER SUCT 3000ML PPV (MISCELLANEOUS) ×2 IMPLANT
CATH CHOLANG 76X19 KUMAR (CATHETERS) ×4 IMPLANT
CHLORAPREP W/TINT 26 (MISCELLANEOUS) ×2 IMPLANT
CLIP APPLIE ROT 10 11.4 M/L (STAPLE) IMPLANT
CLIP VESOLOCK MED LG 6/CT (CLIP) IMPLANT
COVER MAYO STAND STRL (DRAPES) ×2 IMPLANT
COVER SURGICAL LIGHT HANDLE (MISCELLANEOUS) ×2 IMPLANT
COVER WAND RF STERILE (DRAPES) ×2 IMPLANT
DERMABOND ADVANCED (GAUZE/BANDAGES/DRESSINGS) ×1
DERMABOND ADVANCED .7 DNX12 (GAUZE/BANDAGES/DRESSINGS) ×1 IMPLANT
DRAIN CHANNEL 19F RND (DRAIN) ×2 IMPLANT
DRAPE C-ARM 42X120 X-RAY (DRAPES) ×2 IMPLANT
DRSG TEGADERM 4X4.75 (GAUZE/BANDAGES/DRESSINGS) ×2 IMPLANT
ELECT REM PT RETURN 9FT ADLT (ELECTROSURGICAL) ×2
ELECTRODE REM PT RTRN 9FT ADLT (ELECTROSURGICAL) ×1 IMPLANT
ENDOLOOP SUT PDS II  0 18 (SUTURE) ×1
ENDOLOOP SUT PDS II 0 18 (SUTURE) ×1 IMPLANT
EVACUATOR SILICONE 100CC (DRAIN) ×2 IMPLANT
GLOVE SURG SS PI 7.0 STRL IVOR (GLOVE) ×2 IMPLANT
GLOVE SURG UNDER POLY LF SZ7 (GLOVE) ×2 IMPLANT
GOWN STRL REUS W/ TWL LRG LVL3 (GOWN DISPOSABLE) ×4 IMPLANT
GOWN STRL REUS W/TWL LRG LVL3 (GOWN DISPOSABLE) ×4
GRASPER SUT TROCAR 14GX15 (MISCELLANEOUS) ×2 IMPLANT
HEMOSTAT SNOW SURGICEL 2X4 (HEMOSTASIS) ×2 IMPLANT
KIT BASIN OR (CUSTOM PROCEDURE TRAY) ×2 IMPLANT
KIT TURNOVER KIT B (KITS) ×2 IMPLANT
NEEDLE 22X1 1/2 (OR ONLY) (NEEDLE) ×2 IMPLANT
NS IRRIG 1000ML POUR BTL (IV SOLUTION) ×2 IMPLANT
PAD ARMBOARD 7.5X6 YLW CONV (MISCELLANEOUS) ×2 IMPLANT
POUCH RETRIEVAL ECOSAC 10 (ENDOMECHANICALS) ×1 IMPLANT
POUCH RETRIEVAL ECOSAC 10MM (ENDOMECHANICALS) ×1
SCISSORS LAP 5X35 DISP (ENDOMECHANICALS) ×2 IMPLANT
SET CHOLANGIOGRAPH 5 50 .035 (SET/KITS/TRAYS/PACK) ×2 IMPLANT
SET IRRIG TUBING LAPAROSCOPIC (IRRIGATION / IRRIGATOR) ×2 IMPLANT
SET TUBE SMOKE EVAC HIGH FLOW (TUBING) ×2 IMPLANT
SLEEVE ENDOPATH XCEL 5M (ENDOMECHANICALS) ×4 IMPLANT
SPECIMEN JAR SMALL (MISCELLANEOUS) ×2 IMPLANT
STOPCOCK 4 WAY LG BORE MALE ST (IV SETS) ×2 IMPLANT
SUT ETHILON 2 0 FS 18 (SUTURE) ×2 IMPLANT
SUT MNCRL AB 4-0 PS2 18 (SUTURE) ×2 IMPLANT
TOWEL GREEN STERILE (TOWEL DISPOSABLE) ×2 IMPLANT
TOWEL GREEN STERILE FF (TOWEL DISPOSABLE) ×2 IMPLANT
TRAY LAPAROSCOPIC MC (CUSTOM PROCEDURE TRAY) ×2 IMPLANT
TROCAR XCEL 12X100 BLDLESS (ENDOMECHANICALS) ×2 IMPLANT
TROCAR XCEL NON-BLD 5MMX100MML (ENDOMECHANICALS) ×2 IMPLANT
WATER STERILE IRR 1000ML POUR (IV SOLUTION) ×2 IMPLANT

## 2020-12-17 NOTE — Transfer of Care (Signed)
Immediate Anesthesia Transfer of Care Note  Patient: Martin Mcdowell  Procedure(s) Performed: LAPAROSCOPIC CHOLECYSTECTOMY WITH INTRAOPERATIVE CHOLANGIOGRAM (N/A Abdomen)  Patient Location: PACU  Anesthesia Type:General  Level of Consciousness: awake, alert  and oriented  Airway & Oxygen Therapy: Patient Spontanous Breathing and Patient connected to face mask oxygen  Post-op Assessment: Report given to RN, Post -op Vital signs reviewed and stable and Patient moving all extremities  Post vital signs: Reviewed and stable  Last Vitals:  Vitals Value Taken Time  BP 103/69 12/17/20 1059  Temp    Pulse 57 12/17/20 1101  Resp 18 12/17/20 1101  SpO2 100 % 12/17/20 1101  Vitals shown include unvalidated device data.  Last Pain:  Vitals:   12/17/20 0501  TempSrc: Oral  PainSc:          Complications: No complications documented.

## 2020-12-17 NOTE — Progress Notes (Signed)
PROGRESS NOTE  Martin Mcdowell WJX:[id number] DOB: 1951-02-02 DOA: 12/14/2020 PCP: Karl Ito, DO   LOS: 3 days   Brief Narrative / Interim history: 70 year old male with history of toxoplasma chorioretinitis, chronic systolic CHF, coronary artery disease with stenting in June 2021, history of dress syndrome, hyperlipidemia comes to the hospital with abdominal pain, nausea and vomiting.  This has been on and off for the past 3 weeks but became more acute and decided to come to the hospital.  He was found to have acute gallstone pancreatitis.  He was placed on fluids, GI and general surgery were consulted. Lap chole on 2/24  Subjective / 24h Interval events: Seen on the way to the OR This AM  Assessment & Plan:  Acute pancreatitis with choledocholithiasis  -patient with 3 weeks of abdominal pain, possibly worse after eating.  Was found to have elevation in his lipase on admission and ultrasound showed cholelithiasis with biliary dilatation suggestive of choledocholithiasis.   -General surgery and gastroenterology following.   -last dose of plavix was on 2/20 1 AM prior to coming to the hospital.   -laparoscopic cholecystectomy on 2/24 (CBC in AM, holding ASA/plavix for now)  Coronary artery disease, prior MI with stents in June 2021, chronic systolic CHF -patient appears euvolemic on exam.  Most recent EF in August 12 2134-40% and grade 2 diastolic dysfunction.  Continue aspirin, statin, beta-blockers as well as Entresto as blood pressure tolerates.  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 Yes; Fluid consistency: Thin  Diet effective now       Question Answer Comment  Room service appropriate? Yes   Fluid consistency: Thin      12/17/20 1237          DVT prophylaxis: SCDs Start: 12/14/20 1418     Code Status: DNR  Family Communication: no family at bedside   Status is: Inpatient  Remains inpatient appropriate because:Inpatient level of care appropriate due to severity of illness   Dispo: The patient is from: Home              Anticipated d/c is to: Home              Anticipated d/c date is: 2 days              Patient currently is not medically stable to d/c.   Difficult to place patient No   Level of care: Med-Surg  Consultants:  General surgery Gastroenterology cards      Objective: Vitals:   12/17/20 1130 12/17/20 1145 12/17/20 1200 12/17/20 1229  BP: 103/68 92/67 103/66 105/66  Pulse: 65 65 69 65  Resp: 20 10  10   Temp:  97.8 F (36.6 C) 97.8 F (36.6 C) (!) 97.3 F (36.3 C)  TempSrc:    Oral  SpO2: 100% 93% 96% 96%  Weight:      Height:        Intake/Output Summary (Last 24 hours) at 12/17/2020 1301 Last data filed at 12/17/2020 1039 Gross per 24 hour  Intake 2630 ml  Output 1675 ml  Net 955 ml   Filed Weights   12/16/20 0900 12/17/20 0501 12/17/20 0809  Weight: 78.5 kg 78.5 kg 78.5 kg    Examination:    General: Appearance:     Overweight male in no acute distress     Lungs:      respirations unlabored  Heart:    Normal heart rate. Normal rhythm. No murmurs, rubs, or gallops.   MS:   All extremities are intact.   Neurologic:   Awake,  alert, oriented x 3. No apparent focal neurological           defect.     Data Reviewed: I have independently reviewed following labs and imaging studies   CBC: Recent Labs  Lab 12/14/20 0901 12/15/20 0432 12/16/20 0507 12/17/20 0346  WBC 11.0* 7.7 5.5 4.5  HGB 11.9* 11.2* 10.4* 10.2*  HCT 39.1 34.1* 31.5* 31.4*  MCV 61.5* 59.8* 59.3* 59.0*  PLT 178 145* 153 151   Basic Metabolic Panel: Recent Labs  Lab 12/14/20 0901 12/15/20 0432 12/16/20 0507 12/17/20 0346  NA 138 138 139 138  K 3.6 3.2* 3.1* 3.5  CL 108 107 108 107  CO2 19* 22 21* 21*  GLUCOSE 146* 94 79 93  BUN 15 14 9 9   CREATININE 1.03 1.16 0.88 0.87  CALCIUM 9.0 8.3* 8.5* 8.6*   Liver Function Tests: Recent Labs  Lab 12/14/20 0901 12/15/20 0432 12/16/20 0507 12/17/20 0346  AST 223* 107* 61* 50*  ALT 214* 132* 95* 79*  ALKPHOS 476* 339* 312* 284*  BILITOT 4.5* 5.9* 2.6* 2.1*  PROT 6.9 5.3* 5.5* 5.6*  ALBUMIN 3.3* 2.5* 2.5* 2.5*   Coagulation Profile: No results for input(s): INR, PROTIME in the last 168 hours. HbA1C: No results for input(s): HGBA1C in the last 72 hours. CBG: No results for input(s): GLUCAP in the last 168 hours.  Recent Results (from the past 240 hour(s))  Resp Panel by RT-PCR (Flu A&B, Covid) Nasopharyngeal Swab     Status: None   Collection Time: 12/14/20 11:16 AM   Specimen: Nasopharyngeal Swab; Nasopharyngeal(NP) swabs in vial transport medium  Result Value Ref Range Status   SARS Coronavirus 2 by RT PCR NEGATIVE NEGATIVE Final    Comment: (NOTE) SARS-CoV-2 target nucleic acids are NOT DETECTED.  The SARS-CoV-2 RNA is generally detectable in upper respiratory specimens during the acute phase of infection. The lowest concentration of SARS-CoV-2 viral copies this assay can detect is 138 copies/mL. A negative result does not preclude SARS-Cov-2 infection and should not be used as the sole basis for treatment or other patient management decisions. A negative result may occur  with  improper specimen collection/handling, submission of specimen other than nasopharyngeal swab, presence of viral mutation(s) within the areas targeted by this assay, and inadequate number of viral copies(<138 copies/mL). A negative result must be combined with clinical observations, patient history, and epidemiological information. The expected result is Negative.  Fact Sheet for Patients:  12/16/20  Fact Sheet for Healthcare Providers:  BloggerCourse.com  This test is no t yet approved or cleared by the SeriousBroker.it  States FDA and  has been authorized for detection and/or diagnosis of SARS-CoV-2 by FDA under an Emergency Use Authorization (EUA). This EUA will remain  in effect (meaning this test can be used) for the duration of the COVID-19 declaration under Section 564(b)(1) of the Act, 21 U.S.C.section 360bbb-3(b)(1), unless the authorization is terminated  or revoked sooner.       Influenza A by PCR NEGATIVE NEGATIVE Final   Influenza B by PCR NEGATIVE NEGATIVE Final    Comment: (NOTE) The Xpert Xpress SARS-CoV-2/FLU/RSV plus assay is intended as an aid in the diagnosis of influenza from Nasopharyngeal swab specimens and should not be used as a sole basis for treatment. Nasal washings and aspirates are unacceptable for Xpert Xpress SARS-CoV-2/FLU/RSV testing.  Fact Sheet for Patients: BloggerCourse.com  Fact Sheet for Healthcare Providers: SeriousBroker.it  This test is not yet approved or cleared by the [place] FDA and has been authorized for detection and/or diagnosis of SARS-CoV-2 by FDA under an Emergency Use Authorization (EUA). This EUA will remain in effect (meaning this test can be used) for the duration of the COVID-19 declaration under Section 564(b)(1) of the Act, 21 U.S.C. section 360bbb-3(b)(1), unless the authorization is terminated  or revoked.  Performed at [facility], [address], [place], [place] [address]   Surgical pcr screen     Status: None   Collection Time: 12/17/20  5:17 AM   Specimen: Nasal Mucosa; Nasal Swab  Result Value Ref Range Status   MRSA, PCR NEGATIVE NEGATIVE Final   Staphylococcus aureus NEGATIVE NEGATIVE Final    Comment: (NOTE) The Xpert SA Assay (FDA approved for NASAL specimens in patients 89 years of age and older), is one component of a comprehensive surveillance program. It is not intended to diagnose infection nor to guide or monitor treatment. Performed at [facility], [address] [place], [place] [address]      Radiology Studies: DG Cholangiogram Operative  Result Date: 12/17/2020 CLINICAL DATA:  70 year old male with laparoscopic cholecystectomy EXAM: INTRAOPERATIVE CHOLANGIOGRAM TECHNIQUE: Cholangiographic images from the C-arm fluoroscopic device were submitted for interpretation post-operatively. Please see the procedural report for the amount of contrast and the fluoroscopy time utilized. COMPARISON:  None. FINDINGS: Surgical instruments project over the upper abdomen. There is cannulation of the cystic duct/gallbladder neck, with antegrade infusion of contrast. Caliber of the extrahepatic ductal system borderline dilated No definite filling defect within the extrahepatic ducts identified. Free flow of contrast across the ampulla. IMPRESSION: Intraoperative cholangiogram demonstrates extrahepatic biliary ducts borderline dilated, with no definite filling defects identified. Free flow of contrast across the ampulla. Please refer to the dictated operative report for full details of intraoperative findings and procedure Electronically Signed   By: Gilmer Mor D.O.   On: 12/17/2020 11:57    Marlin Canary DO [facility]  Between 7 am - 7 pm I am available, please contact me via Amion or Securechat  Between 7 pm - 7 am I am not available,  please contact night coverage MD/APP via Amion

## 2020-12-17 NOTE — Anesthesia Procedure Notes (Signed)
Procedure Name: Intubation Date/Time: 12/17/2020 9:17 AM Performed by: Darletta Moll, CRNA Pre-anesthesia Checklist: Patient identified, Emergency Drugs available, Suction available and Patient being monitored Patient Re-evaluated:Patient Re-evaluated prior to induction Oxygen Delivery Method: Circle system utilized Preoxygenation: Pre-oxygenation with 100% oxygen Induction Type: IV induction Laryngoscope Size: Mac and 4 Grade View: Grade I Tube type: Oral Tube size: 7.5 mm Number of attempts: 1 Airway Equipment and Method: Stylet Placement Confirmation: ETT inserted through vocal cords under direct vision,  positive ETCO2 and breath sounds checked- equal and bilateral Secured at: 22 cm Tube secured with: Tape Dental Injury: Teeth and Oropharynx as per pre-operative assessment

## 2020-12-17 NOTE — Anesthesia Postprocedure Evaluation (Signed)
Anesthesia Post Note  Patient: Martin Mcdowell  Procedure(s) Performed: LAPAROSCOPIC CHOLECYSTECTOMY WITH INTRAOPERATIVE CHOLANGIOGRAM (N/A Abdomen)     Patient location during evaluation: PACU Anesthesia Type: General Level of consciousness: awake and alert Pain management: pain level controlled Vital Signs Assessment: post-procedure vital signs reviewed and stable Respiratory status: spontaneous breathing, nonlabored ventilation and respiratory function stable Cardiovascular status: blood pressure returned to baseline and stable Postop Assessment: no apparent nausea or vomiting Anesthetic complications: no   No complications documented.  Last Vitals:  Vitals:   12/17/20 1200 12/17/20 1229  BP: 103/66 105/66  Pulse: 69 65  Resp: 10   Temp: 36.6 C (!) 36.3 C  SpO2: 96% 96%    Last Pain:  Vitals:   12/17/20 1254  TempSrc:   PainSc: 7                  Lucretia Kern

## 2020-12-17 NOTE — Anesthesia Preprocedure Evaluation (Signed)
Anesthesia Evaluation  Patient identified by MRN, date of birth, ID band Patient awake    Reviewed: Allergy & Precautions, NPO status , Patient's Chart, lab work & pertinent test results  History of Anesthesia Complications Negative for: history of anesthetic complications  Airway Mallampati: II  TM Distance: >3 FB Neck ROM: Full    Dental  (+) Edentulous Upper, Missing   Pulmonary neg pulmonary ROS, former smoker,    Pulmonary exam normal        Cardiovascular hypertension, + CAD, + Past MI, + Cardiac Stents (June 2021) and +CHF  Normal cardiovascular exam     Neuro/Psych negative neurological ROS  negative psych ROS   GI/Hepatic Neg liver ROS, hiatal hernia, GERD  ,Gallstone pancreatitis   Endo/Other  negative endocrine ROS  Renal/GU negative Renal ROS  negative genitourinary   Musculoskeletal negative musculoskeletal ROS (+)   Abdominal   Peds  Hematology negative hematology ROS (+) anemia ,   Anesthesia Other Findings  Echo 07/31/20: EF 35-40%, g2dd, normal RV function, no hemodynamically significant valve dysfunction  Reproductive/Obstetrics                           Anesthesia Physical Anesthesia Plan  ASA: III  Anesthesia Plan: General   Post-op Pain Management:    Induction: Intravenous and Rapid sequence  PONV Risk Score and Plan: 3 and Ondansetron, Dexamethasone, Treatment may vary due to age or medical condition and Midazolam  Airway Management Planned: Oral ETT  Additional Equipment: None  Intra-op Plan:   Post-operative Plan: Extubation in OR  Informed Consent: I have reviewed the patients History and Physical, chart, labs and discussed the procedure including the risks, benefits and alternatives for the proposed anesthesia with the patient or authorized representative who has indicated his/her understanding and acceptance.   Patient has DNR.  Discussed DNR with  patient and Suspend DNR.   Dental advisory given  Plan Discussed with:   Anesthesia Plan Comments:        Anesthesia Quick Evaluation

## 2020-12-17 NOTE — Plan of Care (Signed)

## 2020-12-17 NOTE — Op Note (Addendum)
PATIENT:  Martin Mcdowell  70 y.o. male  PRE-OPERATIVE DIAGNOSIS:  GALL STONE PANCREATITIS  POST-OPERATIVE DIAGNOSIS:  GALL STONE PANCREATITIS  PROCEDURE:  Procedure(s): LAPAROSCOPIC CHOLECYSTECTOMY WITH INTRAOPERATIVE CHOLANGIOGRAM   SURGEON:  Surgeon(s): Atalie Oros, De Blanch, MD  ASSISTANT: Axel Filler, M.D., Bailey Mech  ANESTHESIA:   local and general  Indications for procedure: Martin Mcdowell is a 70 y.o. male with symptoms of Abdominal pain and Nausea and vomiting consistent with gallbladder disease, Confirmed by ultrasound and CT.  Description of procedure: The patient was brought into the operative suite, placed supine. Anesthesia was administered with endotracheal tube. Patient was strapped in place and foot board was secured. All pressure points were offloaded by foam padding. The patient was prepped and draped in the usual sterile fashion.  A periumbilical incision was made and optical entry was used to enter the abdomen. 2 5 mm trocars were placed on in the right lateral space on in the right subcostal space. A 71mm trocar was placed in the subxiphoid space. Marcaine was infused to the subxiphoid space and lateral upper right abdomen in the transversus abdominis plane. Next the patient was placed in reverse trendelenberg. The gallbladder appeared chronically inflamed.   The gallbladder was retracted cephalad and lateral. The peritoneum was reflected off the infundibulum working lateral to medial. The cystic artery was identified. The triangle was very fibrotic. During dissection there was greater than average venous bleeding that responded to pressure. A window on the body of the gallbladder was created and Kumar clamp used for IOC. The CBD was dilated but no stones were visualized and the contrast emptied into the duodenum. Additional dissection was attempted at the infundibulum but again with more bleeding. Dr. Derrell Lolling was called into the room to help with due to bleeding. The  infundibulum was divided and the origin of the cystic duct identified intraluminally. A 0 PDS endoloop was put across the stump just below the origin of the cystic duct.   The gallbladder was removed off the liver bed with cautery. The Gallbladder was placed in a specimen bag. The gallbladder fossa was irrigated and hemostasis was applied with cautery. The gallbladder was removed via the 40mm trocar. The area of bleeding was reexamined with no further bleeding was seen. A 19 fr blake drain was placed with the tip in the gallbladder fossa and brought out the right lateral port site and sutured in place with a 2-0 Nylon, loose stones were removed and Jamelle Haring was placed in the area of the bleeding.. Pneumoperitoneum was removed, all trocar were removed. All incisions were closed with 4-0 monocryl subcuticular stitch. The patient woke from anesthesia and was brought to PACU in stable condition. All counts were correct  Findings: chronically inflamed gallbladder. No stones seen in CBD, emptying of contrast into the duodenum  Specimen: gallbladder  Blood loss: 200 ml  Local anesthesia: 30 ml Marcaine  Complications: none  PLAN OF CARE: Admit to inpatient   PATIENT DISPOSITION:  PACU - hemodynamically stable.  Feliciana Rossetti, M.D. General, Bariatric, & Minimally Invasive Surgery [facility]

## 2020-12-17 NOTE — Progress Notes (Signed)
Pre Procedure note for inpatients:   Martin Mcdowell has been scheduled for Procedure(s) with comments: LAPAROSCOPIC CHOLECYSTECTOMY WITH INTRAOPERATIVE CHOLANGIOGRAM (N/A) - ROOM 1 STARTING AT 09:00AM FOR 90 MIN today. The various methods of treatment have been discussed with the patient. After consideration of the risks, benefits and treatment options the patient has consented to the planned procedure.   The patient has been seen and labs reviewed. There are no changes in the patient's condition to prevent proceeding with the planned procedure today.  Recent labs:  Lab Results  Component Value Date   WBC 4.5 12/17/2020   HGB 10.2 (L) 12/17/2020   HCT 31.4 (L) 12/17/2020   PLT 151 12/17/2020   GLUCOSE 93 12/17/2020   CHOL 139 06/25/2020   TRIG 102 06/25/2020   HDL 45 06/25/2020   LDLCALC 75 06/25/2020   ALT 79 (H) 12/17/2020   AST 50 (H) 12/17/2020   NA 138 12/17/2020   K 3.5 12/17/2020   CL 107 12/17/2020   CREATININE 0.87 12/17/2020   BUN 9 12/17/2020   CO2 21 (L) 12/17/2020   TSH 1.85 08/07/2018   PSA 1.37 09/18/2018   INR 0.9 05/15/2020   HGBA1C 5.8 (H) 05/15/2020    [name], MD 12/17/2020 8:00 AM

## 2020-12-18 ENCOUNTER — Encounter (HOSPITAL_COMMUNITY): Payer: Self-pay | Admitting: General Surgery

## 2020-12-18 DIAGNOSIS — K8042 Calculus of bile duct with acute cholecystitis without obstruction: Secondary | ICD-10-CM | POA: Diagnosis not present

## 2020-12-18 LAB — SURGICAL PATHOLOGY

## 2020-12-18 LAB — CBC
HCT: 30.2 % — ABNORMAL LOW (ref 39.0–52.0)
Hemoglobin: 9.7 g/dL — ABNORMAL LOW (ref 13.0–17.0)
MCH: 19.2 pg — ABNORMAL LOW (ref 26.0–34.0)
MCHC: 32.1 g/dL (ref 30.0–36.0)
MCV: 59.9 fL — ABNORMAL LOW (ref 80.0–100.0)
Platelets: 166 10*3/uL (ref 150–400)
RBC: 5.04 MIL/uL (ref 4.22–5.81)
RDW: 16.7 % — ABNORMAL HIGH (ref 11.5–15.5)
WBC: 6.2 10*3/uL (ref 4.0–10.5)
nRBC: 0 % (ref 0.0–0.2)

## 2020-12-18 MED ORDER — ENTRESTO 49-51 MG PO TABS: 1.0000 | ORAL_TABLET | Freq: Two times a day (BID) | ORAL | 6 refills | Status: DC

## 2020-12-18 MED ORDER — CLOPIDOGREL BISULFATE 75 MG PO TABS: 75.0000 mg | ORAL_TABLET | Freq: Every day | ORAL | 3 refills | Status: DC

## 2020-12-18 MED ORDER — OXYCODONE HCL 5 MG PO TABS: 5.0000 mg | ORAL_TABLET | Freq: Four times a day (QID) | ORAL | 0 refills | Status: AC | PRN

## 2020-12-18 MED ORDER — ASPIRIN 81 MG PO CHEW
81.0000 mg | CHEWABLE_TABLET | Freq: Once | ORAL | Status: AC
  Administered 2020-12-18: 81 mg via ORAL
  Filled 2020-12-18: qty 1

## 2020-12-18 NOTE — Care Management Important Message (Signed)
Important Message  Patient Details  Name: Martin Mcdowell MRN: [id number] Date of Birth: October 14, 1951   Medicare Important Message Given:  Yes     Oralia Rud Shular 12/18/2020, 2:43 PM

## 2020-12-18 NOTE — Progress Notes (Signed)
Progress Note  Patient Name: Martin Mcdowell Date of Encounter: 12/18/2020  [facility] Cardiologist: Lesleigh Noe, MD   Subjective   Feeling well.  No chest pain or shortness of breath.  Surgical site pain with deep inspiration.  Inpatient Medications    Scheduled Meds: . acetaminophen  650 mg Oral Q6H  . atorvastatin  80 mg Oral Daily  . lidocaine  1 patch Transdermal Q24H  . metoprolol succinate  25 mg Oral Daily  . pantoprazole  40 mg Oral Daily   Continuous Infusions:  PRN Meds: morphine injection, ondansetron **OR** ondansetron (ZOFRAN) IV, oxyCODONE   Vital Signs    Vitals:   12/17/20 1706 12/17/20 2357 12/18/20 0532 12/18/20 0956  BP: 114/79 105/73 102/71 102/62  Pulse: (!) 58 67 60 (!) 55  Resp: 16 17 18    Temp: 98.3 F (36.8 C) 98.2 F (36.8 C) 98 F (36.7 C)   TempSrc: Oral Oral Oral   SpO2: 96% 95% 95%   Weight:   79.1 kg   Height:         Intake/Output Summary (Last 24 hours) at 12/18/2020 1146 Last data filed at 12/18/2020 0912 Gross per 24 hour  Intake 240 ml  Output 580 ml  Net -340 ml   Last 3 Weights 12/18/2020 12/17/2020 12/17/2020  Weight (lbs) 174 lb 4.8 oz 173 lb 173 lb  Weight (kg) 79.062 kg 78.472 kg 78.472 kg      Telemetry    N/A- Personally Reviewed  ECG    n/a - Personally Reviewed  Physical Exam   VS:  BP 102/62   Pulse (!) 55   Temp 98 F (36.7 C) (Oral)   Resp 18   Ht 5\' 7"  (1.702 m)   Wt 79.1 kg   SpO2 95%   BMI 27.30 kg/m  , BMI Body mass index is 27.3 kg/m. GENERAL:  Well appearing HEENT: Pupils equal round and reactive, fundi not visualized, oral mucosa unremarkable NECK:  No jugular venous distention, waveform within normal limits, carotid upstroke brisk and symmetric, no bruits LUNGS:  Clear to auscultation bilaterally HEART:  RRR.  PMI not displaced or sustained,S1 and S2 within normal limits, no S3, no S4, no clicks, no rubs, no murmurs ABD:  Flat, positive bowel sounds normal in frequency in  pitch, no bruits, no rebound, no guarding, no midline pulsatile mass, no hepatomegaly, no splenomegaly EXT:  2 plus pulses throughout, no edema, no cyanosis no clubbing SKIN:  No rashes no nodules NEURO:  Cranial nerves II through XII grossly intact, motor grossly intact throughout PSYCH:  Cognitively intact, oriented to person place and time   Labs    High Sensitivity Troponin:   Recent Labs  Lab 12/14/20 0901 12/14/20 1116  TROPONINIHS 16 14      Chemistry Recent Labs  Lab 12/15/20 0432 12/16/20 0507 12/17/20 0346  NA 138 139 138  K 3.2* 3.1* 3.5  CL 107 108 107  CO2 22 21* 21*  GLUCOSE 94 79 93  BUN 14 9 9   CREATININE 1.16 0.88 0.87  CALCIUM 8.3* 8.5* 8.6*  PROT 5.3* 5.5* 5.6*  ALBUMIN 2.5* 2.5* 2.5*  AST 107* 61* 50*  ALT 132* 95* 79*  ALKPHOS 339* 312* 284*  BILITOT 5.9* 2.6* 2.1*  GFRNONAA >60 >60 >60  ANIONGAP 9 10 10      Hematology Recent Labs  Lab 12/16/20 0507 12/17/20 0346 12/18/20 0427  WBC 5.5 4.5 6.2  RBC 5.31 5.32 5.04  HGB 10.4*  10.2* 9.7*  HCT 31.5* 31.4* 30.2*  MCV 59.3* 59.0* 59.9*  MCH 19.6* 19.2* 19.2*  MCHC 33.0 32.5 32.1  RDW 16.6* 16.4* 16.7*  PLT 153 151 166    BNPNo results for input(s): BNP, PROBNP in the last 168 hours.   DDimer No results for input(s): DDIMER in the last 168 hours.   Radiology    DG Cholangiogram Operative  Result Date: 12/17/2020 CLINICAL DATA:  70 year old male with laparoscopic cholecystectomy EXAM: INTRAOPERATIVE CHOLANGIOGRAM TECHNIQUE: Cholangiographic images from the C-arm fluoroscopic device were submitted for interpretation post-operatively. Please see the procedural report for the amount of contrast and the fluoroscopy time utilized. COMPARISON:  None. FINDINGS: Surgical instruments project over the upper abdomen. There is cannulation of the cystic duct/gallbladder neck, with antegrade infusion of contrast. Caliber of the extrahepatic ductal system borderline dilated No definite filling defect  within the extrahepatic ducts identified. Free flow of contrast across the ampulla. IMPRESSION: Intraoperative cholangiogram demonstrates extrahepatic biliary ducts borderline dilated, with no definite filling defects identified. Free flow of contrast across the ampulla. Please refer to the dictated operative report for full details of intraoperative findings and procedure Electronically Signed   By: Gilmer Mor D.O.   On: 12/17/2020 11:57    Cardiac Studies   Echo 07/31/20:  1. Left ventricular ejection fraction, by estimation, is 35 to 40%. The  left ventricle has moderately decreased function. The left ventricle  demonstrates regional wall motion abnormalities (see scoring  diagram/findings for description). Left ventricular  diastolic parameters are consistent with Grade II diastolic dysfunction  (pseudonormalization). Elevated left atrial pressure.  2. There is significant swirling artifact at LV apex on Definity images,  but no clear thrombus seen  3. Right ventricular systolic function is normal. The right ventricular  size is normal. Tricuspid regurgitation signal is inadequate for assessing  PA pressure.  4. The mitral valve is normal in structure. Trivial mitral valve  regurgitation.  5. The aortic valve is tricuspid. Aortic valve regurgitation is trivial.  Mild aortic valve sclerosis is present, with no evidence of aortic valve  stenosis.  6. Possible PFO, not well visualized.  7. 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  8. The inferior vena cava is normal in size with greater than 50%  respiratory variability, suggesting right atrial pressure of 3 mmHg.   Patient Profile     70 y.o. male with CAD s/p anterior STEMI 04/2020, multivessel PCI, chronic systolic diastolic heart failure (LVEF 35 to 40%), hypertension, hyperlipidemia and DRESS syndrome admitted with gallstone pancreatitis.     Assessment & Plan    # Chronic systolic and  diastolic heart failure: LVEF 35 to 40%.  He is euvolemic on exam.  IV fluids have been discontinued postoperatively.  His blood pressure remains low.  We will continue to hold Entresto until SBP is consistently greater than 110.  I asked him to check his blood pressures when he goes home and resume at half a tablet twice a day once his blood pressures over 110.  He will check his blood pressures and heart rates and bring to follow-up which we have arranged.  Continue metoprolol.    # CAD status post anterior STEMI 04/2020: Multivessel PCI 7 months ago.  Clopidogrel was on hold preoperatively.  Per surgical team okay to resume tomorrow.  Continue aspirin, atorvastatin, and metoprolol.    [facility] will sign off.   Medication Recommendations:  Resume Entresto at 24/26mg  when SBP >110.  Resume  Plavix tomorrow Other recommendations (labs, testing, etc):  Track BP at home and bring to f/u Follow up as an outpatient:  3/8 at 10:45.  [facility]    For questions or updates, please contact [facility] Please consult [email or web address] for contact info under        Signed, Chilton Si, MD  12/18/2020, 11:46 AM

## 2020-12-18 NOTE — Plan of Care (Signed)
Pt understanding of d/c instructions.

## 2020-12-18 NOTE — Discharge Instructions (Signed)
[facility]  Please arrive at least 30 min before your appointment to complete your check in paperwork.  If you are unable to arrive 30 min prior to your appointment time we may have to cancel or reschedule you. LAPAROSCOPIC SURGERY: POST OP INSTRUCTIONS Always review your discharge instruction sheet given to you by the facility where your surgery was performed. IF YOU HAVE DISABILITY OR FAMILY LEAVE FORMS, YOU MUST BRING THEM TO THE OFFICE FOR PROCESSING.   DO NOT GIVE THEM TO YOUR DOCTOR.  PAIN CONTROL  1. First take acetaminophen (Tylenol) AND/or ibuprofen (Advil) to control your pain after surgery.  Follow directions on package.  Taking acetaminophen (Tylenol) and/or ibuprofen (Advil) regularly after surgery will help to control your pain and lower the amount of prescription pain medication you may need.  You should not take more than 4,000 mg (4 grams) of acetaminophen (Tylenol) in 24 hours.  You should not take ibuprofen (Advil), aleve, motrin, naprosyn or other NSAIDS if you have a history of stomach ulcers or chronic kidney disease.  2. A prescription for pain medication may be given to you upon discharge.  Take your pain medication as prescribed, if you still have uncontrolled pain after taking acetaminophen (Tylenol) or ibuprofen (Advil). 3. Use ice packs to help control pain. 4. If you need a refill on your pain medication, please contact your pharmacy.  They will contact our office to request authorization. Prescriptions will not be filled after 5pm or on week-ends.  HOME MEDICATIONS 5. Take your usually prescribed medications unless otherwise directed.  DIET 6. You should follow a light diet the first few days after arrival home.  Be sure to include lots of fluids daily. Avoid fatty, fried foods.   CONSTIPATION 7. It is common to experience some constipation after surgery and if you are taking pain medication.  Increasing fluid intake and taking a stool  softener (such as Colace) will usually help or prevent this problem from occurring.  A mild laxative (Milk of Magnesia or Miralax) should be taken according to package instructions if there are no bowel movements after 48 hours.  WOUND/INCISION CARE 8. Most patients will experience some swelling and bruising in the area of the incisions.  Ice packs will help.  Swelling and bruising can take several days to resolve.  9. Unless discharge instructions indicate otherwise, follow guidelines below  a. STERI-STRIPS - you may remove your outer bandages 48 hours after surgery, and you may shower at that time.  You have steri-strips (small skin tapes) in place directly over the incision.  These strips should be left on the skin for 7-10 days.   b. DERMABOND/SKIN GLUE - you may shower in 24 hours.  The glue will flake off over the next 2-3 weeks. 10. Any sutures or staples will be removed at the office during your follow-up visit.  ACTIVITIES 11. You may resume regular (light) daily activities beginning the next day--such as daily self-care, walking, climbing stairs--gradually increasing activities as tolerated.  You may have sexual intercourse when it is comfortable.  Refrain from any heavy lifting or straining until approved by your doctor. a. You may drive when you are no longer taking prescription pain medication, you can comfortably wear a seatbelt, and you can safely maneuver your car and apply brakes.  FOLLOW-UP 12. You should see your doctor in the office for a follow-up appointment approximately 2-3 weeks after your surgery.  You should have been given your post-op/follow-up appointment when   your surgery was scheduled.  If you did not receive a post-op/follow-up appointment, make sure that you call for this appointment within a day or two after you arrive home to insure a convenient appointment time.   WHEN TO CALL YOUR DOCTOR: 1. Fever over 101.0 2. Inability to urinate 3. Continued bleeding from  incision. 4. Increased pain, redness, or drainage from the incision. 5. Increasing abdominal pain  The clinic staff is available to answer your questions during regular business hours.  Please don't hesitate to call and ask to speak to one of the nurses for clinical concerns.  If you have a medical emergency, go to the nearest emergency room or call 911.  A surgeon from [facility] is always on call at the hospital. [address], [address], [place], [place]  [address] ? [address] [address], [place], [place]   [address] [phone] ? [phone] ? FAX [phone]       JP Drain Totals  Bring this sheet to all of your post-operative appointments while you have your drains.  Please measure your drains by CC's or ML's.  Make sure you drain and measure your JP Drains 3 times per day.  At the end of each day, add up totals for the left side and add up totals for the right side.    ( 9 am )     ( 3 pm )        ( 9 pm )                Date L  R  L  R  L  R  Total L/R

## 2020-12-18 NOTE — Progress Notes (Addendum)
Progress Note: General Surgery Service   Chief Complaint/Subjective: Pain controlled, tolerating liquids  Objective: Vital signs in last 24 hours: Temp:  [97.1 F (36.2 C)-98.3 F (36.8 C)] 98 F (36.7 C) (02/25 0532) Pulse Rate:  [58-69] 60 (02/25 0532) Resp:  [10-20] 18 (02/25 0532) BP: (92-114)/(64-79) 102/71 (02/25 0532) SpO2:  [93 %-100 %] 95 % (02/25 0532) Weight:  [79.1 kg] 79.1 kg (02/25 0532) Last BM Date: 12/16/20  Intake/Output from previous day: 02/24 0701 - 02/25 0700 In: 590 [P.O.:240; IV Piggyback:350] Out: 800 [Urine:500; Drains:50; Blood:250] Intake/Output this shift: No intake/output data recorded.  Gen: NAD  Resp: nonlabored  Card: RRR  Abd: soft, incisions c/d/i, drain with clear yellow/red color  Lab Results: CBC  Recent Labs    12/17/20 0346 12/18/20 0427  WBC 4.5 6.2  HGB 10.2* 9.7*  HCT 31.4* 30.2*  PLT 151 166   BMET Recent Labs    12/16/20 0507 12/17/20 0346  NA 139 138  K 3.1* 3.5  CL 108 107  CO2 21* 21*  GLUCOSE 79 93  BUN 9 9  CREATININE 0.88 0.87  CALCIUM 8.5* 8.6*   PT/INR No results for input(s): LABPROT, INR in the last 72 hours. ABG No results for input(s): PHART, HCO3 in the last 72 hours.  Invalid input(s): PCO2, PO2  Anti-infectives: Anti-infectives (From admission, onward)   Start     Dose/Rate Route Frequency Ordered Stop   12/16/20 1045  cefTRIAXone (ROCEPHIN) 2 g in sodium chloride 0.9 % 100 mL IVPB       Note to Pharmacy: Pharmacy may adjust dosing strength / duration / interval for maximal efficacy   2 g 200 mL/hr over 30 Minutes Intravenous To Short Stay 12/16/20 1033 12/17/20 0945   12/14/20 1500  piperacillin-tazobactam (ZOSYN) IVPB 3.375 g  Status:  Discontinued        3.375 g 12.5 mL/hr over 240 Minutes Intravenous Every 8 hours 12/14/20 1419 12/15/20 1028      Medications: Scheduled Meds: . acetaminophen  650 mg Oral Q6H  . aspirin  81 mg Oral Once  . atorvastatin  80 mg Oral Daily  .  lidocaine  1 patch Transdermal Q24H  . metoprolol succinate  25 mg Oral Daily  . pantoprazole  40 mg Oral Daily   Continuous Infusions: PRN Meds:.morphine injection, ondansetron **OR** ondansetron (ZOFRAN) IV, oxyCODONE  Assessment/Plan: s/p Procedure(s): LAPAROSCOPIC CHOLECYSTECTOMY WITH INTRAOPERATIVE CHOLANGIOGRAM 12/17/2020 -discussed drain management and reasoning -discussed operative findings of fibrosis and venous bleeding -Hgb stable today -ok to restart aspirin today, resume plavix tomorrow -advance diet -f/u in office in 1 week for drain check/removal -f/u with me in 3 weeks -possible discharge this afternoon    LOS: 4 days   [name], MD [phone] [facility]

## 2020-12-18 NOTE — Discharge Summary (Signed)
Physician Discharge Summary  Martin Mcdowell KGU:[id number] DOB: 17-Mar-1951 DOA: 12/14/2020  PCP: Karl Ito, DO  Admit date: 12/14/2020 Discharge date: 12/18/2020  Admitted From: home Discharge disposition: home   Recommendations for Outpatient Follow-Up:   1. Close follow up with GS/cardiology 2. BMP 1 week 3. Restart entresto when SBP >110   Discharge Diagnosis:   Principal Problem:   Choledocholithiasis with acute cholecystitis Active Problems:   DRESS syndrome   Toxoplasmosis   CAD (coronary artery disease), native coronary artery   Hyperlipidemia LDL goal <70   Essential hypertension   Chronic combined systolic (congestive) and diastolic (congestive) heart failure (HCC)   RUQ abdominal pain    Discharge Condition: Improved.  Diet recommendation: Low sodium, heart healthy  Wound care: None.  Code status: Full.   History of Present Illness:   Martin Mcdowell is a 70 y.o. male with medical history significant of toxoplasma chorioretinitis; HTN; DRESS syndrome; HLD; CAD s/p stent; and chronic systolic CHF presenting with abdominal pain.  He developed acute abdominal pain.  It started about 3 weeks ago.  Pain was just R of his midepigastric region.  It seemed to fluctuate on its own, maybe worse a few hours after eating.  It started last night and was terribly painful, preventing him from sleeping.  No n/v.  Normal BMs.  He seemed extremely weak over the last few days.  No fevers.   Hospital Course by Problem:   Acute pancreatitis with choledocholithiasis  -patient with 3 weeks of abdominal pain, possibly worse after eating.  Was found to have elevation in his lipase on admission and ultrasound showed cholelithiasis with biliary dilatation suggestive of choledocholithiasis.   -laparoscopic cholecystectomy on 2/24  -General surgery:   -ok to restart aspirin today, resume plavix tomorrow  -advance diet  -f/u in office in 1 week for drain check/removal  -f/u  with me in 3 weeks   Coronary artery disease, prior MI with stents in June 2021, chronic systolic CHF -patient appears euvolemic on exam.  Most recent EF in August 12 2134-40% and grade 2 diastolic dysfunction.  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   The results of significant diagnostics from this hospitalization (including imaging, microbiology, ancillary and laboratory) are listed below for reference.     Procedures and Diagnostic Studies:   DG Chest 2 View  Result Date: 12/14/2020 CLINICAL DATA:  Chest pain EXAM: CHEST - 2 VIEW COMPARISON:  05/13/2020 FINDINGS: No new consolidation or edema. Cystic change at the right lung base on prior CT is not as well reproduced on radiograph. No pleural effusion or pneumothorax. Stable cardiomediastinal contours with normal heart size. Mild loss of height of a lower thoracic vertebral body. IMPRESSION: No acute process in the chest. Age-indeterminate mild lower thoracic vertebral body compression fracture. Electronically Signed   By: Guadlupe Spanish M.D.   On: 12/14/2020 09:33    CT Abdomen Pelvis W Contrast  Result Date: 12/14/2020 CLINICAL DATA:  Epigastric pain for 2 weeks. Concern for choledocholithiasis on ultrasound. EXAM: CT ABDOMEN AND PELVIS WITH CONTRAST TECHNIQUE: Multidetector CT imaging of the abdomen and pelvis was performed using the standard protocol following bolus administration of intravenous contrast. CONTRAST:  OMNIPAQUE IOHEXOL 300 MG/ML  SOLN COMPARISON:  Ultrasound of earlier today.  CTA chest 03/15/2019. FINDINGS: Lower chest: Similar appearance of right lower lobe consolidation with bronchiectasis including on 11/05. Arterial supply off the upper abdominal aorta is more apparent on the prior CTA. Multivessel coronary artery atherosclerosis. Small hiatal hernia with fluid in the herniated stomach including on 10/03. Distal esophageal fluid as well. Hepatobiliary: Normal liver.  No calcified gallstones. Mild intrahepatic biliary duct dilatation. Moderate common duct dilatation, including at 1.5 cm on 81/7. This is followed to the level of the ampulla. Subtle hyperattenuation in this area including at 6 mm on 39/3. Pancreas: No pancreatic duct dilatation. Suspect mild peripancreatic edema, including adjacent the tail on 26/3 and the head on 31/3. Spleen: Normal in size, without focal abnormality. Adrenals/Urinary Tract: Normal adrenal glands. Normal kidneys, without hydronephrosis. Normal urinary bladder. Stomach/Bowel: Gastric body underdistention. Normal colon and terminal ileum. Normal small bowel. Vascular/Lymphatic: Aortic atherosclerosis. No abdominopelvic adenopathy. Reproductive: Normal prostate. Trace right scrotal fluid, likely physiologic. Other: No significant free fluid. Musculoskeletal: No acute osseous abnormality. IMPRESSION: 1. Intra and extrahepatic biliary duct dilatation with subtle hyperattenuation at the level of the ampulla. Equivocal for 6 mm stone versus ampullary edema. Consider further evaluation with ERCP. If preprocedure  differentiation is desired, MRCP may be informative. 2. Suspicion of mild pancreatitis, lipase pending. 3. Similar right lower lobe process compared to 03/15/2019, most consistent with pulmonary sequestration, favor intralobar type. 4. Small hiatal hernia with fluid in the herniated stomach and distal esophagus, suggesting dysmotility and/or gastroesophageal reflux. 5.  Aortic Atherosclerosis (ICD10-I70.0). Electronically Signed   By: Jeronimo Greaves M.D.   On: 12/14/2020 12:22   US Abdomen Limited RUQ (LIVER/GB)  Result Date: 12/14/2020 CLINICAL DATA:  Right upper quadrant pain, jaundice. EXAM: ULTRASOUND ABDOMEN LIMITED RIGHT UPPER QUADRANT COMPARISON:  03/15/2019. FINDINGS: Gallbladder: Prominence of the gallbladder wall measuring up to 3.7 mm. Intraluminal calculi measuring up to 1.2 cm with sludge. Sonographic Eulah Pont sign could not be assessed secondary to premedication. Common bile duct: Diameter: 15.4 mm proximally. Overlying bowel gas limits visualization of the distal CBD. Liver: No focal lesion identified. Within normal limits in parenchymal echogenicity. Mild intrahepatic biliary dilatation. Portal vein is patent on color Doppler imaging with normal direction of blood flow towards the liver. Other: None. IMPRESSION: Cholelithiasis and intrahepatic/extrahepatic biliary dilatation is suspicious for choledocholithiasis. Cholecystitis is lower on the differential given lack of wall edema/pericholecystic free fluid. Sonographic Eulah Pont sign could not be assessed secondary to premedication. Electronically Signed   By: Stana Bunting M.D.   On: 12/14/2020 10:58     Labs:   Basic Metabolic Panel: Recent Labs  Lab 12/14/20 0901 12/15/20 0432 12/16/20 0507 12/17/20 0346  NA 138 138 139  138  K 3.6 3.2* 3.1* 3.5  CL 108 107 108 107  CO2 19* 22 21* 21*  GLUCOSE 146* 94 79 93  BUN 15 14 9 9   CREATININE 1.03 1.16 0.88 0.87  CALCIUM 9.0 8.3* 8.5* 8.6*   GFR Estimated Creatinine Clearance: 76  mL/min (by C-G formula based on SCr of 0.87 mg/dL). Liver Function Tests: Recent Labs  Lab 12/14/20 0901 12/15/20 0432 12/16/20 0507 12/17/20 0346  AST 223* 107* 61* 50*  ALT 214* 132* 95* 79*  ALKPHOS 476* 339* 312* 284*  BILITOT 4.5* 5.9* 2.6* 2.1*  PROT 6.9 5.3* 5.5* 5.6*  ALBUMIN 3.3* 2.5* 2.5* 2.5*   Recent Labs  Lab 12/14/20 0901 12/15/20 0432 12/17/20 0346  LIPASE 5,978* 601* 163*   No results for input(s): AMMONIA in the last 168 hours. Coagulation profile No results for input(s): INR, PROTIME in the last 168 hours.  CBC: Recent Labs  Lab 12/14/20 0901 12/15/20 0432 12/16/20 0507 12/17/20 0346 12/18/20 0427  WBC 11.0* 7.7 5.5 4.5 6.2  HGB 11.9* 11.2* 10.4* 10.2* 9.7*  HCT 39.1 34.1* 31.5* 31.4* 30.2*  MCV 61.5* 59.8* 59.3* 59.0* 59.9*  PLT 178 145* 153 151 166   Cardiac Enzymes: No results for input(s): CKTOTAL, CKMB, CKMBINDEX, TROPONINI in the last 168 hours. BNP: Invalid input(s): POCBNP CBG: No results for input(s): GLUCAP in the last 168 hours. D-Dimer No results for input(s): DDIMER in the last 72 hours. Hgb A1c No results for input(s): HGBA1C in the last 72 hours. Lipid Profile No results for input(s): CHOL, HDL, LDLCALC, TRIG, CHOLHDL, LDLDIRECT in the last 72 hours. Thyroid function studies No results for input(s): TSH, T4TOTAL, T3FREE, THYROIDAB in the last 72 hours.  Invalid input(s): FREET3 Anemia work up No results for input(s): VITAMINB12, FOLATE, FERRITIN, TIBC, IRON, RETICCTPCT in the last 72 hours. Microbiology Recent Results (from the past 240 hour(s))  Resp Panel by RT-PCR (Flu A&B, Covid) Nasopharyngeal Swab     Status: None   Collection Time: 12/14/20 11:16 AM   Specimen: Nasopharyngeal Swab; Nasopharyngeal(NP) swabs in vial transport medium  Result Value Ref Range Status   SARS Coronavirus 2 by RT PCR NEGATIVE NEGATIVE Final    Comment: (NOTE) SARS-CoV-2 target nucleic acids are NOT DETECTED.  The SARS-CoV-2 RNA is  generally detectable in upper respiratory specimens during the acute phase of infection. The lowest concentration of SARS-CoV-2 viral copies this assay can detect is 138 copies/mL. A negative result does not preclude SARS-Cov-2 infection and should not be used as the sole basis for treatment or other patient management decisions. A negative result may occur with  improper specimen collection/handling, submission of specimen other than nasopharyngeal swab, presence of viral mutation(s) within the areas targeted by this assay, and inadequate number of viral copies(<138 copies/mL). A negative result must be combined with clinical observations, patient history, and epidemiological information. The expected result is Negative.  Fact Sheet for Patients:  12/16/20  Fact Sheet for Healthcare Providers:  BloggerCourse.com  This test is no t yet approved or cleared by the [place] FDA and  has been authorized for detection and/or diagnosis of SARS-CoV-2 by FDA under an Emergency Use Authorization (EUA). This EUA will remain  in effect (meaning this test can be used) for the duration of the COVID-19 declaration under Section 564(b)(1) of the Act, 21 U.S.C.section 360bbb-3(b)(1), unless the authorization is terminated  or revoked sooner.       Influenza A by PCR NEGATIVE NEGATIVE Final   Influenza B  by PCR NEGATIVE NEGATIVE Final    Comment: (NOTE) The Xpert Xpress SARS-CoV-2/FLU/RSV plus assay is intended as an aid in the diagnosis of influenza from Nasopharyngeal swab specimens and should not be used as a sole basis for treatment. Nasal washings and aspirates are unacceptable for Xpert Xpress SARS-CoV-2/FLU/RSV testing.  Fact Sheet for Patients: BloggerCourse.com  Fact Sheet for Healthcare Providers: SeriousBroker.it  This test is not yet approved or cleared by the [place] FDA and has been authorized for detection and/or diagnosis of SARS-CoV-2 by FDA under an Emergency Use Authorization (EUA). This EUA will remain in effect (meaning this test can be used) for the duration of the COVID-19 declaration under Section 564(b)(1) of the Act, 21 U.S.C. section 360bbb-3(b)(1), unless the authorization is terminated or revoked.  Performed at [facility], [address], [place], [place] [address]   Surgical pcr screen     Status: None   Collection Time: 12/17/20  5:17 AM   Specimen: Nasal Mucosa; Nasal Swab  Result Value Ref Range Status   MRSA, PCR NEGATIVE NEGATIVE Final   Staphylococcus aureus NEGATIVE NEGATIVE Final    Comment: (NOTE) The Xpert SA Assay (FDA approved for NASAL specimens in patients 35 years of age and older), is one component of a comprehensive surveillance program. It is not intended to diagnose infection nor to guide or monitor treatment. Performed at [facility], [address] [place], [place] [address]      Discharge Instructions:   Discharge Instructions    Diet - low sodium heart healthy   Complete by: As directed    Discharge instructions   Complete by: As directed    Check BP daily: Resume Entresto at 24/26mg  when SBP >110.   Resume Plavix 2/26 -f/u in general surgery office in 1 week for drain check/removal -f/u with surgeon in 3 weeks   Discharge wound care:   Complete by: As directed    General surgery discussed drain management and reasoning   Increase activity slowly   Complete by: As directed      Allergies as of 12/18/2020      Reactions   Sulfamethoxazole-trimethoprim Anaphylaxis, Rash   DRESS syndrome requiring hospitalization; 12/11/2018      Medication List    TAKE these medications   aspirin 81 MG EC tablet Take 1 tablet (81 mg total) by mouth daily. Swallow whole.   atorvastatin 80 MG tablet Commonly known as: LIPITOR Take 1 tablet (80 mg total) by mouth  daily.   clopidogrel 75 MG tablet Commonly known as: PLAVIX Take 1 tablet (75 mg total) by mouth daily. Start taking on: December 19, 2020   Entresto 49-51 MG Generic drug: sacubitril-valsartan Take 1 tablet by mouth 2 (two) times daily. Start when SBP >110 Start taking on: December 21, 2020 What changed:   additional instructions  These instructions start on December 21, 2020. If you are unsure what to do until then, ask your doctor or other care provider.   metoprolol succinate 25 MG 24 hr tablet Commonly known as: TOPROL-XL Take 0.5 tablets (12.5 mg total) by mouth daily. What changed: how much to take   niacin 500 MG tablet Take 500 mg by mouth daily.   nitroGLYCERIN 0.4 MG SL tablet Commonly known as: NITROSTAT Place 1 tablet (0.4 mg total) under the tongue every 5 (five) minutes x 3 doses as needed for chest pain.   oxyCODONE 5 MG immediate release tablet Commonly known as: Oxy IR/ROXICODONE Take  1-2 tablets (5-10 mg total) by mouth every 6 (six) hours as needed for up to 5 days for moderate pain.   pantoprazole 40 MG tablet Commonly known as: PROTONIX Take 40 mg by mouth daily.            Discharge Care Instructions  (From admission, onward)         Start     Ordered   12/18/20 0000  Discharge wound care:       Comments: General surgery discussed drain management and reasoning   12/18/20 1254          Follow-up Information    Kinsinger, De BlanchLuke Aaron, MD Follow up on 01/07/2021.   Specialty: General Surgery Why: 9:10 am, arrive by 8:55am for check in. Contact information: [address] [place] [address] [phone]        [facility] [place] Follow up on 12/25/2020.   Specialty: General Surgery Why: 3/4 at 1:30 pm arrive by 1:00pm for check in process and paperwork.  Contact information: [address] [place] [id number] [phone]       Ronney Astersleaver, Jesse M, NP Follow up on 12/29/2020.    Specialty: Cardiology Why: at 10:45am  Contact information: [address] [place] [address] [phone]        Karl ItoPatel, Nilay, DO Follow up in 1 week(s).   Specialty: General Practice Contact information: [address] [place] [address] [phone]        Lyn RecordsSmith, Henry W, MD .   Specialty: Cardiology Contact information: [phone] [place] [address] [phone]                Time coordinating discharge: 35 min  Signed:  Joseph ArtJessica U Catrinia Racicot DO  [facility] 12/18/2020, 12:55 PM

## 2020-12-29 ENCOUNTER — Ambulatory Visit: Payer: Medicare Other | Admitting: General Practice

## 2021-01-21 NOTE — Progress Notes (Signed)
Cardiology Office Note:    Date:  01/22/2021   ID:  Tamala Julian, DOB 1951-03-15, MRN [id number]  PCP:  Karl Ito, DO  Cardiologist:  Lesleigh Noe, MD   Referring MD: No ref. provider found   Chief Complaint  Patient presents with  . Congestive Heart Failure  . Coronary Artery Disease    History of Present Illness:    Martin Mcdowell is a 70 y.o. male with a hx of anterior MI 05/15/2020, acute systolic heart failureEF 35 to 40% (04/2020) and unchanged 07/31/2020 after GDMT, hypertension, DRESS syndrome, and hyperlipidemia. Recent atypical chest pain and most recent EF 35-40%.  Still concerned about ejection fraction and wanting to make sure her EF is greater than 40%.  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  He is not having angina.  He has not had syncope.  No nitroglycerin use.  No angina.  Very active at home.    Past Medical History:  Diagnosis Date  . Anemia   . CAD (coronary artery disease)    s/p stents  . 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/14/2020  . Chronic systolic CHF (congestive heart failure) (HCC)   . DRESS syndrome   . GERD (gastroesophageal reflux disease)   . Hiatal hernia   . Hypertension   . 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 CHOLECYSTECTOMY N/A 12/17/2020   Procedure: LAPAROSCOPIC CHOLECYSTECTOMY WITH INTRAOPERATIVE CHOLANGIOGRAM;  Surgeon: Kinsinger, De Blanch, MD;  Location: [facility] OR;  Service: General;  Laterality: N/A;  ROOM 1 STARTING AT 09:00AM FOR 90 MIN  . CORONARY STENT INTERVENTION N/A 05/18/2020   Procedure: CORONARY STENT INTERVENTION;  Surgeon: Lyn Records, MD;  [facility] INVASIVE CV LAB;  Service: Cardiovascular;  Laterality: N/A;  . CORONARY/GRAFT ACUTE MI REVASCULARIZATION N/A 05/15/2020   Procedure:  CORONARY/GRAFT ACUTE MI REVASCULARIZATION;  Surgeon: Lyn Records, MD;  Location: [facility] INVASIVE CV LAB;  Service: Cardiovascular;  Laterality: N/A;    Current Medications: Current Meds  Medication Sig  . aspirin EC 81 MG EC tablet Take 1 tablet (81 mg total) by mouth daily. Swallow whole.  Marland Kitchen atorvastatin (LIPITOR) 80 MG tablet Take 1 tablet (80 mg total) by mouth daily.  . clopidogrel (PLAVIX) 75 MG tablet Take 1 tablet (75 mg total) by mouth daily. (Patient taking differently: Take 37.5 mg by mouth daily.)  . metoprolol succinate (TOPROL-XL) 25 MG 24 hr tablet Take 0.5 tablets (12.5 mg total) by mouth daily.  . niacin 500 MG tablet Take 500 mg by mouth daily.  . nitroGLYCERIN (NITROSTAT) 0.4 MG SL tablet Place 1 tablet (0.4 mg total) under the tongue every 5 (five) minutes x 3 doses as needed for chest pain.  . pantoprazole (PROTONIX) 40 MG tablet Take 40 mg by mouth daily.  . sacubitril-valsartan (ENTRESTO) 49-51 MG Take 1 tablet by mouth 2 (two) times daily. Start 1/2 pill when SBP >110     Allergies:   Sulfamethoxazole-trimethoprim   Social History   Socioeconomic History  . Marital status: Married    Spouse name: Not on file  . Number of children: Not on file  . Years of education: Not on file  . Highest education level: Not on file  Occupational History  . Not on file  Tobacco  Use  . Smoking status: Former Smoker    Packs/day: 2.00    Years: 22.00    Pack years: 44.00    Types: Cigarettes    Quit date: 1990    Years since quitting: 32.2  . Smokeless tobacco: Never Used  Vaping Use  . Vaping Use: Never used  Substance and Sexual Activity  . Alcohol use: Yes    Alcohol/week: 2.0 - 3.0 standard drinks    Types: 2 - 3 Standard drinks or equivalent per week    Comment: Occ   . Drug use: No  . Sexual activity: Not on file  Other Topics Concern  . Not on file  Social History Narrative  . Not on file   Social Determinants of Health   Financial Resource Strain: Not  on file  Food Insecurity: Not on file  Transportation Needs: Not on file  Physical Activity: Not on file  Stress: Not on file  Social Connections: Not on file     Family History: The patient's family history is negative for Colon cancer, Stomach cancer, Pancreatic cancer, Esophageal cancer, and Rectal cancer.  ROS:   Please see the history of present illness.    Had recent gallbladder surgery.  Being discharged from the hospital on less intense heart failure regimen then previously recommended.  Readjustment of his medication regimen is made today.  All other systems reviewed and are negative.  EKGs/Labs/Other Studies Reviewed:    The following studies were reviewed today: No recent imaging  EKG:  EKG not performed  Recent Labs: 12/17/2020: ALT 79; BUN 9; Creatinine, Ser 0.87; Potassium 3.5; Sodium 138 12/18/2020: Hemoglobin 9.7; Platelets 166  Recent Lipid Panel    Component Value Date/Time   CHOL 139 06/25/2020 0945   TRIG 102 06/25/2020 0945   HDL 45 06/25/2020 0945   CHOLHDL 3.1 06/25/2020 0945   CHOLHDL 5.4 05/15/2020 1109   VLDL 29 05/15/2020 1109   LDLCALC 75 06/25/2020 0945    Physical Exam:    VS:  BP 110/70   Pulse 79   Ht 5\' 7"  (1.702 m)   Wt 172 lb 3.2 oz (78.1 kg)   SpO2 96%   BMI 26.97 kg/m     Wt Readings from Last 3 Encounters:  01/22/21 172 lb 3.2 oz (78.1 kg)  12/18/20 174 lb 4.8 oz (79.1 kg)  10/14/20 179 lb 12.8 oz (81.6 kg)     GEN: Healthy-appearing. No acute distress HEENT: Normal NECK: No JVD. LYMPHATICS: No lymphadenopathy CARDIAC: No murmur. RRR no gallop, or edema. VASCULAR:  Normal Pulses. No bruits. RESPIRATORY:  Clear to auscultation without rales, wheezing or rhonchi  ABDOMEN: Soft, non-tender, non-distended, No pulsatile mass, MUSCULOSKELETAL: No deformity  SKIN: Warm and dry NEUROLOGIC:  Alert and oriented x 3 PSYCHIATRIC:  Normal affect   ASSESSMENT:    1. Coronary artery disease of native artery of native heart with  stable angina pectoris (HCC)   2. Hyperlipidemia LDL goal <70   3. Chronic systolic heart failure (HCC)   4. Ischemic cardiomyopathy   5. Acute anterior wall MI (HCC)    PLAN:    In order of problems listed above:  1. Continue secondary prevention.  Continue aspirin and Plavix.  Dual antiplatelet therapy should be continued for at least 1 year after stenting which would be July 2022. 2. LDL target less than 70.  Continue Lipitor 80 mg/day.  Last LDL was 75 in September. 3. LVEF 35 to 40%.  Entresto dose was uptitrated after  last echo in October.  We will plan to repeat echocardiogram in May.  We still have SGLT2 therapy and MRA therapy that are not being used.  They will be instituted if EF is still lower than desired.  Increase the metoprolol succinate back to 25 mg/day, continue Entresto 49/51 mg twice daily. 4. Ischemic cardiomyopathy hoping to get EF about 40%.  Overall education and awareness concerning primary/secondary risk prevention was discussed in detail: LDL less than 70, hemoglobin A1c less than 7, blood pressure target less than 130/80 mmHg, >150 minutes of moderate aerobic activity per week, avoidance of smoking, weight control (via diet and exercise), and continued surveillance/management of/for obstructive sleep apnea.    Medication Adjustments/Labs and Tests Ordered: Current medicines are reviewed at length with the patient today.  Concerns regarding medicines are outlined above.  Orders Placed This Encounter  Procedures  . ECHOCARDIOGRAM COMPLETE   No orders of the defined types were placed in this encounter.   There are no Patient Instructions on file for this visit.   Signed, Lesleigh Noe, MD  01/22/2021 4:03 PM    [facility]

## 2021-01-22 ENCOUNTER — Other Ambulatory Visit: Payer: Self-pay

## 2021-01-22 ENCOUNTER — Encounter: Payer: Self-pay | Admitting: Interventional Cardiology

## 2021-01-22 ENCOUNTER — Ambulatory Visit (INDEPENDENT_AMBULATORY_CARE_PROVIDER_SITE_OTHER): Payer: Medicare Other | Admitting: Interventional Cardiology

## 2021-01-22 VITALS — BP 110/70 | HR 79 | Ht 67.0 in | Wt 172.2 lb

## 2021-01-22 DIAGNOSIS — I2109 ST elevation (STEMI) myocardial infarction involving other coronary artery of anterior wall: Secondary | ICD-10-CM

## 2021-01-22 DIAGNOSIS — I255 Ischemic cardiomyopathy: Secondary | ICD-10-CM

## 2021-01-22 DIAGNOSIS — I25118 Atherosclerotic heart disease of native coronary artery with other forms of angina pectoris: Secondary | ICD-10-CM | POA: Diagnosis not present

## 2021-01-22 DIAGNOSIS — I5022 Chronic systolic (congestive) heart failure: Secondary | ICD-10-CM

## 2021-01-22 DIAGNOSIS — E785 Hyperlipidemia, unspecified: Secondary | ICD-10-CM

## 2021-01-22 MED ORDER — CLOPIDOGREL BISULFATE 75 MG PO TABS: 75.0000 mg | ORAL_TABLET | Freq: Every day | ORAL | 3 refills | Status: DC

## 2021-01-22 MED ORDER — METOPROLOL SUCCINATE ER 25 MG PO TB24: 25.0000 mg | ORAL_TABLET | Freq: Every day | ORAL | 3 refills | Status: DC

## 2021-01-22 NOTE — Patient Instructions (Signed)
Medication Instructions:  1) INCREASE Metoprolol Succinate to 25mg  once daily  *If you need a refill on your cardiac medications before your next appointment, please call your pharmacy*   Lab Work: None If you have labs (blood work) drawn today and your tests are completely normal, you will receive your results only by: MyChart Message (if you have MyChart) OR . A paper copy in the mail If you have any lab test that is abnormal or we need to change your treatment, we will call you to review the results.   Testing/Procedures: Your physician has requested that you have an echocardiogram. Echocardiography is a painless test that uses sound waves to create images of your heart. It provides your doctor with information about the size and shape of your heart and how well your heart's chambers and valves are working. This procedure takes approximately one hour. There are no restrictions for this procedure.   Follow-Up: At [facility], you and your health needs are our priority.  As part of our continuing mission to provide you with exceptional heart care, we have created designated Provider Care Teams.  These Care Teams include your primary Cardiologist (physician) and Advanced Practice Providers (APPs -  Physician Assistants and Nurse Practitioners) who all work together to provide you with the care you need, when you need it.  We recommend signing up for the patient portal called "MyChart".  Sign up information is provided on this After Visit Summary.  MyChart is used to connect with patients for Virtual Visits (Telemedicine).  Patients are able to view lab/test results, encounter notes, upcoming appointments, etc.  Non-urgent messages can be sent to your provider as well.   To learn more about what you can do with MyChart, go to [facility].    Your next appointment:   6 month(s)  The format for your next appointment:   In Person  Provider:   You may see [email or web address],  MD or one of the following Advanced Practice Providers on your designated Care Team:    Lesleigh Noe, NP    Other Instructions

## 2021-03-04 ENCOUNTER — Ambulatory Visit (HOSPITAL_COMMUNITY): Payer: Medicare Other | Attending: Cardiology

## 2021-03-04 ENCOUNTER — Other Ambulatory Visit: Payer: Self-pay

## 2021-03-04 DIAGNOSIS — I5022 Chronic systolic (congestive) heart failure: Secondary | ICD-10-CM | POA: Insufficient documentation

## 2021-03-04 LAB — ECHOCARDIOGRAM COMPLETE
Area-P 1/2: 2.8 cm2
S' Lateral: 3.4 cm

## 2021-03-04 MED ORDER — PERFLUTREN LIPID MICROSPHERE
1.0000 mL | INTRAVENOUS | Status: AC | PRN
  Administered 2021-03-04: 2 mL via INTRAVENOUS

## 2021-03-05 ENCOUNTER — Encounter: Payer: Self-pay | Admitting: *Deleted

## 2021-06-01 ENCOUNTER — Other Ambulatory Visit: Payer: Self-pay | Admitting: *Deleted

## 2021-06-01 MED ORDER — ENTRESTO 49-51 MG PO TABS: 1.0000 | ORAL_TABLET | Freq: Two times a day (BID) | ORAL | 6 refills | Status: DC

## 2021-06-18 ENCOUNTER — Other Ambulatory Visit: Payer: Self-pay | Admitting: Physician Assistant

## 2021-06-21 MED ORDER — ENTRESTO 49-51 MG PO TABS: 1.0000 | ORAL_TABLET | Freq: Two times a day (BID) | ORAL | 6 refills | Status: DC

## 2021-06-23 NOTE — Telephone Encounter (Signed)
Called pt re: his My Chart message.. he was confused by the SIG of his Entresto RX:   Take 1 tablet by mouth 2 (two) times daily. Start 1/2 pill when SBP >110  He says he has never take the extra 1/2 as noted..his BP has been around 110/80, he does not have any readings to give me but says it has been staying consistent.. he is also feeling "very well".   I have asked him to disregard the extra 1/2 as noted on his directions unless his BP suddenly is elevated but he should then call us and to keep a record of his BP readings for Dr. Katrinka Blazing to review at his 07/15/21 follow up and he agrees and will let us know if he has any further questions  or problems prior to his appt.

## 2021-07-14 NOTE — Progress Notes (Signed)
Cardiology Office Note:    Date:  07/15/2021   ID:  Martin Mcdowell, DOB Feb 07, 1951, MRN [id number]  PCP:  Karl Ito, DO  Cardiologist:  Lesleigh Noe, MD   Referring MD: Karl Ito, DO   Chief Complaint  Patient presents with   Coronary Artery Disease   Congestive Heart Failure     History of Present Illness:    Martin Mcdowell is a 70 y.o. 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 Recent atypical chest pain and most recent EF 35-40%.  Martin Mcdowell is not having angina, dyspnea, orthopnea, edema, palpitations, and has not had syncope.  He does not want to take medications.  He is having a hard time reconciling feeling as well as he does but having to take as many pills as we have prescribed.  He has never used nitroglycerin.  We spent significant time describing the preventative nature of antiplatelet therapy and statin therapy.  We discussed the need for heart failure therapy including the standard quadruple therapy of which he is only on double drug therapy because of blood pressure being low.  Despite having relatively low systolic pressures at times around 100 mmHg, he is tolerating therapy well.  After conversation, he backed away somewhat from wanting to discontinue his medications.  We will discontinue aspirin but continue clopidogrel.  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    Past Surgical History:  Procedure  Laterality Date   CHOLECYSTECTOMY N/A 12/17/2020   Procedure: LAPAROSCOPIC CHOLECYSTECTOMY WITH INTRAOPERATIVE CHOLANGIOGRAM;  Surgeon: Kinsinger, De Blanch, MD;  Location: [facility] OR;  Service: General;  Laterality: N/A;  ROOM 1 STARTING AT 09:00AM FOR 90 MIN   CORONARY STENT INTERVENTION N/A 05/18/2020   Procedure: CORONARY STENT INTERVENTION;  Surgeon: Lyn Records, MD;  Location: [facility] INVASIVE CV LAB;  Service: Cardiovascular;  Laterality: N/A;   CORONARY/GRAFT ACUTE MI REVASCULARIZATION N/A 05/15/2020   Procedure: CORONARY/GRAFT ACUTE MI REVASCULARIZATION;  Surgeon: Lyn Records, MD;  Location: [facility] INVASIVE CV LAB;  Service: Cardiovascular;  Laterality: N/A;    Current Medications: Current Meds  Medication Sig   atorvastatin (LIPITOR) 80 MG tablet TAKE 1 TABLET BY MOUTH EVERY DAY   clopidogrel (PLAVIX) 75 MG tablet Take 1 tablet (75 mg total) by mouth daily.   metoprolol succinate (TOPROL-XL) 25 MG 24 hr tablet Take 1 tablet (25 mg total) by mouth daily.   niacin 500 MG tablet Take 500 mg by mouth daily.   nitroGLYCERIN (NITROSTAT) 0.4 MG SL tablet Place 1 tablet (0.4 mg total) under the tongue every 5 (five) minutes x 3 doses as needed for chest pain.   pantoprazole (PROTONIX) 40 MG tablet Take 40 mg by mouth daily.   sacubitril-valsartan (ENTRESTO) 49-51 MG Take 1 tablet by mouth 2 (two) times daily. Start 1/2 pill when SBP >110   [DISCONTINUED] aspirin EC 81 MG EC tablet Take 1 tablet (81  mg total) by mouth daily. Swallow whole.     Allergies:   Sulfamethoxazole-trimethoprim   Social History   Socioeconomic History   Marital status: Married    Spouse name: Not on file   Number of children: Not on file   Years of education: Not on file   Highest education level: Not on file  Occupational History   Not on file  Tobacco Use   Smoking status: Former    Packs/day: 2.00    Years: 22.00    Pack years: 44.00    Types: Cigarettes    Quit date: 74    Years since quitting: 32.7    Smokeless tobacco: Never  Vaping Use   Vaping Use: Never used  Substance and Sexual Activity   Alcohol use: Yes    Alcohol/week: 2.0 - 3.0 standard drinks    Types: 2 - 3 Standard drinks or equivalent per week    Comment: Occ    Drug use: No   Sexual activity: Not on file  Other Topics Concern   Not on file  Social History Narrative   Not on file   Social Determinants of Health   Financial Resource Strain: Not on file  Food Insecurity: Not on file  Transportation Needs: Not on file  Physical Activity: Not on file  Stress: Not on file  Social Connections: Not on file     Family History: The patient's family history is negative for Colon cancer, Stomach cancer, Pancreatic cancer, Esophageal cancer, and Rectal cancer.  ROS:   Please see the history of present illness.    Relatively low blood pressures without symptoms.  Easy bruising on dual antiplatelet therapy.  All other systems reviewed and are negative.  EKGs/Labs/Other Studies Reviewed:    The following studies were reviewed today:  ECHO 02/2021: IMPRESSIONS     1. Left ventricular ejection fraction, by estimation, is 40 to 45%. The  left ventricle has mildly decreased function. The left ventricle  demonstrates regional wall motion abnormalities (see scoring  diagram/findings for description). Left ventricular  diastolic parameters are indeterminate.   2. Right ventricular systolic function is normal. The right ventricular  size is normal. Tricuspid regurgitation signal is inadequate for assessing  PA pressure.   3. The mitral valve is normal in structure. Trivial mitral valve  regurgitation. No evidence of mitral stenosis.   4. The aortic valve is tricuspid. Aortic valve regurgitation is not  visualized. Mild aortic valve sclerosis is present, with no evidence of  aortic valve stenosis.   5. The inferior vena cava is normal in size with greater than 50%  respiratory variability, suggesting right atrial  pressure of 3 mmHg.   EKG:  EKG no new data  Recent Labs: 12/17/2020: ALT 79; BUN 9; Creatinine, Ser 0.87; Potassium 3.5; Sodium 138 12/18/2020: Hemoglobin 9.7; Platelets 166  Recent Lipid Panel    Component Value Date/Time   CHOL 139 06/25/2020 0945   TRIG 102 06/25/2020 0945   HDL 45 06/25/2020 0945   CHOLHDL 3.1 06/25/2020 0945   CHOLHDL 5.4 05/15/2020 1109   VLDL 29 05/15/2020 1109   LDLCALC 75 06/25/2020 0945    Physical Exam:    VS:  BP 102/70   Pulse (!) 56   Ht 5\' 7"  (1.702 m)   Wt 176 lb 9.6 oz (80.1 kg)   SpO2 99%   BMI 27.66 kg/m     Wt Readings from Last 3 Encounters:  07/15/21 176 lb 9.6 oz (80.1 kg)  01/22/21  172 lb 3.2 oz (78.1 kg)  12/18/20 174 lb 4.8 oz (79.1 kg)     GEN: Thin appearing. No acute distress HEENT: Normal NECK: No JVD. LYMPHATICS: No lymphadenopathy CARDIAC: No murmur. RRR no gallop, or edema. VASCULAR:  Normal Pulses. No bruits. RESPIRATORY:  Clear to auscultation without rales, wheezing or rhonchi  ABDOMEN: Soft, non-tender, non-distended, No pulsatile mass, MUSCULOSKELETAL: No deformity  SKIN: Warm and dry NEUROLOGIC:  Alert and oriented x 3 PSYCHIATRIC:  Normal affect   ASSESSMENT:    1. Coronary artery disease of native artery of native heart with stable angina pectoris (HCC)   2. Chronic systolic heart failure (HCC)   3. Hyperlipidemia LDL goal <70   4. Essential hypertension    PLAN:    In order of problems listed above:  Secondary prevention discussed in great detail as outlined above.  150 minutes of moderate physical activity is also encouraged. Continue double drug therapy with beta-blocker and Entresto.  No change in dose.  Were never able to use SGLT2 or MRA therapy.   Continue high intensity atorvastatin 80 mg/day. Blood pressure is not an issue on his current heart failure regimen.  Target should be less than 130/80 and above 100 mmHg systolic.   Guideline directed therapy for left ventricular systolic  dysfunction: Angiotensin receptor-neprilysin inhibitor (ARNI)-Entresto; beta-blocker therapy - carvedilol, metoprolol succinate, or bisoprolol; mineralocorticoid receptor antagonist (MRA) therapy -spironolactone or eplerenone.  SGLT-2 agents -  Dapagliflozin Marcelline Deist) or Empagliflozin (Jardiance).These therapies have been shown to improve clinical outcomes including reduction of rehospitalization, survival, and acute heart failure.    Medication Adjustments/Labs and Tests Ordered: Current medicines are reviewed at length with the patient today.  Concerns regarding medicines are outlined above.  No orders of the defined types were placed in this encounter.  No orders of the defined types were placed in this encounter.   Patient Instructions  Medication Instructions:  1) DISCONTINUE Aspirin  *If you need a refill on your cardiac medications before your next appointment, please call your pharmacy*   Lab Work: None If you have labs (blood work) drawn today and your tests are completely normal, you will receive your results only by: MyChart Message (if you have MyChart) OR A paper copy in the mail If you have any lab test that is abnormal or we need to change your treatment, we will call you to review the results.   Testing/Procedures: None   Follow-Up: At [facility], you and your health needs are our priority.  As part of our continuing mission to provide you with exceptional heart care, we have created designated Provider Care Teams.  These Care Teams include your primary Cardiologist (physician) and Advanced Practice Providers (APPs -  Physician Assistants and Nurse Practitioners) who all work together to provide you with the care you need, when you need it.  We recommend signing up for the patient portal called "MyChart".  Sign up information is provided on this After Visit Summary.  MyChart is used to connect with patients for Virtual Visits (Telemedicine).  Patients are able to  view lab/test results, encounter notes, upcoming appointments, etc.  Non-urgent messages can be sent to your provider as well.   To learn more about what you can do with MyChart, go to [email or web address].    Your next appointment:   9-12 month(s)  The format for your next appointment:   In Person  Provider:   You may see Lesleigh Noe, MD or one of the following Advanced  Practice Providers on your designated Care Team:   Nada Boozer, NP   Other Instructions     Signed, Lesleigh Noe, MD  07/15/2021 2:24 PM    [facility]

## 2021-07-15 ENCOUNTER — Ambulatory Visit (INDEPENDENT_AMBULATORY_CARE_PROVIDER_SITE_OTHER): Payer: Medicare Other | Admitting: Interventional Cardiology

## 2021-07-15 ENCOUNTER — Encounter: Payer: Self-pay | Admitting: Interventional Cardiology

## 2021-07-15 ENCOUNTER — Other Ambulatory Visit: Payer: Self-pay

## 2021-07-15 VITALS — BP 102/70 | HR 56 | Ht 67.0 in | Wt 176.6 lb

## 2021-07-15 DIAGNOSIS — I5022 Chronic systolic (congestive) heart failure: Secondary | ICD-10-CM | POA: Diagnosis not present

## 2021-07-15 DIAGNOSIS — I10 Essential (primary) hypertension: Secondary | ICD-10-CM

## 2021-07-15 DIAGNOSIS — E785 Hyperlipidemia, unspecified: Secondary | ICD-10-CM | POA: Diagnosis not present

## 2021-07-15 DIAGNOSIS — I25118 Atherosclerotic heart disease of native coronary artery with other forms of angina pectoris: Secondary | ICD-10-CM

## 2021-07-15 DIAGNOSIS — I255 Ischemic cardiomyopathy: Secondary | ICD-10-CM | POA: Diagnosis not present

## 2021-07-15 NOTE — Patient Instructions (Signed)
Medication Instructions:  1) DISCONTINUE Aspirin  *If you need a refill on your cardiac medications before your next appointment, please call your pharmacy*   Lab Work: None If you have labs (blood work) drawn today and your tests are completely normal, you will receive your results only by: MyChart Message (if you have MyChart) OR A paper copy in the mail If you have any lab test that is abnormal or we need to change your treatment, we will call you to review the results.   Testing/Procedures: None   Follow-Up: At [facility], you and your health needs are our priority.  As part of our continuing mission to provide you with exceptional heart care, we have created designated Provider Care Teams.  These Care Teams include your primary Cardiologist (physician) and Advanced Practice Providers (APPs -  Physician Assistants and Nurse Practitioners) who all work together to provide you with the care you need, when you need it.  We recommend signing up for the patient portal called "MyChart".  Sign up information is provided on this After Visit Summary.  MyChart is used to connect with patients for Virtual Visits (Telemedicine).  Patients are able to view lab/test results, encounter notes, upcoming appointments, etc.  Non-urgent messages can be sent to your provider as well.   To learn more about what you can do with MyChart, go to [email or web address].    Your next appointment:   9-12 month(s)  The format for your next appointment:   In Person  Provider:   You may see Lesleigh Noe, MD or one of the following Advanced Practice Providers on your designated Care Team:   Nada Boozer, NP   Other Instructions

## 2021-07-29 ENCOUNTER — Other Ambulatory Visit: Payer: Self-pay | Admitting: *Deleted

## 2021-07-29 MED ORDER — NITROGLYCERIN 0.4 MG SL SUBL: 0.4000 mg | SUBLINGUAL_TABLET | SUBLINGUAL | 3 refills | Status: AC | PRN

## 2021-07-29 NOTE — Telephone Encounter (Signed)
Yes it is okay. No concurrent use og SL NTG with 24 hours of last dose.

## 2021-08-22 ENCOUNTER — Other Ambulatory Visit: Payer: Self-pay

## 2021-08-22 ENCOUNTER — Inpatient Hospital Stay (HOSPITAL_COMMUNITY)
Admission: EM | Admit: 2021-08-22 | Discharge: 2021-08-24 | DRG: 194 | Disposition: A | Discharge status: Home / Self Care | Payer: Medicare Other | Attending: Internal Medicine | Admitting: Internal Medicine

## 2021-08-22 ENCOUNTER — Encounter (HOSPITAL_COMMUNITY): Payer: Self-pay | Admitting: Emergency Medicine

## 2021-08-22 ENCOUNTER — Emergency Department (HOSPITAL_COMMUNITY): Payer: Medicare Other

## 2021-08-22 DIAGNOSIS — Z87891 Personal history of nicotine dependence: Secondary | ICD-10-CM | POA: Diagnosis not present

## 2021-08-22 DIAGNOSIS — Z20822 Contact with and (suspected) exposure to covid-19: Secondary | ICD-10-CM | POA: Diagnosis present

## 2021-08-22 DIAGNOSIS — E785 Hyperlipidemia, unspecified: Secondary | ICD-10-CM | POA: Diagnosis present

## 2021-08-22 DIAGNOSIS — Z79899 Other long term (current) drug therapy: Secondary | ICD-10-CM | POA: Diagnosis not present

## 2021-08-22 DIAGNOSIS — K219 Gastro-esophageal reflux disease without esophagitis: Secondary | ICD-10-CM | POA: Diagnosis present

## 2021-08-22 DIAGNOSIS — I5042 Chronic combined systolic (congestive) and diastolic (congestive) heart failure: Secondary | ICD-10-CM | POA: Diagnosis present

## 2021-08-22 DIAGNOSIS — R7989 Other specified abnormal findings of blood chemistry: Secondary | ICD-10-CM | POA: Diagnosis present

## 2021-08-22 DIAGNOSIS — W19XXXA Unspecified fall, initial encounter: Secondary | ICD-10-CM | POA: Diagnosis present

## 2021-08-22 DIAGNOSIS — I110 Hypertensive heart disease with heart failure: Secondary | ICD-10-CM | POA: Diagnosis present

## 2021-08-22 DIAGNOSIS — I2510 Atherosclerotic heart disease of native coronary artery without angina pectoris: Secondary | ICD-10-CM | POA: Diagnosis present

## 2021-08-22 DIAGNOSIS — J189 Pneumonia, unspecified organism: Principal | ICD-10-CM | POA: Diagnosis present

## 2021-08-22 DIAGNOSIS — E871 Hypo-osmolality and hyponatremia: Secondary | ICD-10-CM | POA: Diagnosis present

## 2021-08-22 DIAGNOSIS — S4992XA Unspecified injury of left shoulder and upper arm, initial encounter: Secondary | ICD-10-CM | POA: Diagnosis present

## 2021-08-22 DIAGNOSIS — Z955 Presence of coronary angioplasty implant and graft: Secondary | ICD-10-CM | POA: Diagnosis not present

## 2021-08-22 DIAGNOSIS — N179 Acute kidney failure, unspecified: Secondary | ICD-10-CM | POA: Diagnosis present

## 2021-08-22 DIAGNOSIS — Z888 Allergy status to other drugs, medicaments and biological substances status: Secondary | ICD-10-CM | POA: Diagnosis not present

## 2021-08-22 DIAGNOSIS — I252 Old myocardial infarction: Secondary | ICD-10-CM

## 2021-08-22 DIAGNOSIS — Z7902 Long term (current) use of antithrombotics/antiplatelets: Secondary | ICD-10-CM | POA: Diagnosis not present

## 2021-08-22 DIAGNOSIS — Z9049 Acquired absence of other specified parts of digestive tract: Secondary | ICD-10-CM | POA: Diagnosis not present

## 2021-08-22 DIAGNOSIS — Z66 Do not resuscitate: Secondary | ICD-10-CM | POA: Diagnosis present

## 2021-08-22 DIAGNOSIS — I959 Hypotension, unspecified: Secondary | ICD-10-CM | POA: Diagnosis not present

## 2021-08-22 LAB — URINALYSIS, ROUTINE W REFLEX MICROSCOPIC
Bacteria, UA: NONE SEEN
Bilirubin Urine: NEGATIVE
Glucose, UA: NEGATIVE mg/dL
Ketones, ur: NEGATIVE mg/dL
Leukocytes,Ua: NEGATIVE
Nitrite: NEGATIVE
Protein, ur: 30 mg/dL — AB
Specific Gravity, Urine: 1.017 (ref 1.005–1.030)
pH: 5 (ref 5.0–8.0)

## 2021-08-22 LAB — CBC WITH DIFFERENTIAL/PLATELET
Abs Immature Granulocytes: 0 10*3/uL (ref 0.00–0.07)
Basophils Absolute: 0 10*3/uL (ref 0.0–0.1)
Basophils Relative: 0 %
Eosinophils Absolute: 0 10*3/uL (ref 0.0–0.5)
Eosinophils Relative: 0 %
HCT: 35.1 % — ABNORMAL LOW (ref 39.0–52.0)
Hemoglobin: 11.4 g/dL — ABNORMAL LOW (ref 13.0–17.0)
Lymphocytes Relative: 8 %
Lymphs Abs: 1.2 10*3/uL (ref 0.7–4.0)
MCH: 18.8 pg — ABNORMAL LOW (ref 26.0–34.0)
MCHC: 32.5 g/dL (ref 30.0–36.0)
MCV: 58 fL — ABNORMAL LOW (ref 80.0–100.0)
Monocytes Absolute: 0.9 10*3/uL (ref 0.1–1.0)
Monocytes Relative: 6 %
Neutro Abs: 12.4 10*3/uL — ABNORMAL HIGH (ref 1.7–7.7)
Neutrophils Relative %: 86 %
Platelets: 153 10*3/uL (ref 150–400)
RBC: 6.05 MIL/uL — ABNORMAL HIGH (ref 4.22–5.81)
RDW: 18 % — ABNORMAL HIGH (ref 11.5–15.5)
WBC: 14.4 10*3/uL — ABNORMAL HIGH (ref 4.0–10.5)
nRBC: 0 % (ref 0.0–0.2)

## 2021-08-22 LAB — COMPREHENSIVE METABOLIC PANEL
ALT: 12 U/L (ref 0–44)
AST: 26 U/L (ref 15–41)
Albumin: 2.5 g/dL — ABNORMAL LOW (ref 3.5–5.0)
Alkaline Phosphatase: 73 U/L (ref 38–126)
Anion gap: 14 (ref 5–15)
BUN: 31 mg/dL — ABNORMAL HIGH (ref 8–23)
CO2: 21 mmol/L — ABNORMAL LOW (ref 22–32)
Calcium: 8.3 mg/dL — ABNORMAL LOW (ref 8.9–10.3)
Chloride: 95 mmol/L — ABNORMAL LOW (ref 98–111)
Creatinine, Ser: 1.72 mg/dL — ABNORMAL HIGH (ref 0.61–1.24)
GFR, Estimated: 43 mL/min — ABNORMAL LOW (ref >60)
Glucose, Bld: 124 mg/dL — ABNORMAL HIGH (ref 70–99)
Potassium: 4.1 mmol/L (ref 3.5–5.1)
Sodium: 130 mmol/L — ABNORMAL LOW (ref 135–145)
Total Bilirubin: 1.7 mg/dL — ABNORMAL HIGH (ref 0.3–1.2)
Total Protein: 6.6 g/dL (ref 6.5–8.1)

## 2021-08-22 LAB — BASIC METABOLIC PANEL
Anion gap: 10 (ref 5–15)
BUN: 20 mg/dL (ref 8–23)
CO2: 21 mmol/L — ABNORMAL LOW (ref 22–32)
Calcium: 7.8 mg/dL — ABNORMAL LOW (ref 8.9–10.3)
Chloride: 100 mmol/L (ref 98–111)
Creatinine, Ser: 1.16 mg/dL (ref 0.61–1.24)
GFR, Estimated: >60 mL/min (ref >60)
Glucose, Bld: 112 mg/dL — ABNORMAL HIGH (ref 70–99)
Potassium: 3.8 mmol/L (ref 3.5–5.1)
Sodium: 131 mmol/L — ABNORMAL LOW (ref 135–145)

## 2021-08-22 LAB — RESP PANEL BY RT-PCR (FLU A&B, COVID) ARPGX2
Influenza A by PCR: NEGATIVE
Influenza B by PCR: NEGATIVE
SARS Coronavirus 2 by RT PCR: NEGATIVE

## 2021-08-22 LAB — LACTIC ACID, PLASMA
Lactic Acid, Venous: 2.3 mmol/L (ref 0.5–1.9)
Lactic Acid, Venous: 1.9 mmol/L (ref 0.5–1.9)

## 2021-08-22 LAB — HIV ANTIBODY (ROUTINE TESTING W REFLEX): HIV Screen 4th Generation wRfx: NONREACTIVE

## 2021-08-22 MED ORDER — LACTATED RINGERS IV BOLUS (SEPSIS)
1000.0000 mL | Freq: Once | INTRAVENOUS | Status: AC
  Administered 2021-08-22: 1000 mL via INTRAVENOUS

## 2021-08-22 MED ORDER — GUAIFENESIN ER 600 MG PO TB12
600.0000 mg | ORAL_TABLET | Freq: Two times a day (BID) | ORAL | Status: DC
  Administered 2021-08-22 – 2021-08-24 (×5): 600 mg via ORAL
  Filled 2021-08-22 (×5): qty 1

## 2021-08-22 MED ORDER — CLOPIDOGREL BISULFATE 75 MG PO TABS
75.0000 mg | ORAL_TABLET | Freq: Every day | ORAL | Status: DC
  Administered 2021-08-22 – 2021-08-24 (×3): 75 mg via ORAL
  Filled 2021-08-22 (×3): qty 1

## 2021-08-22 MED ORDER — PANTOPRAZOLE SODIUM 40 MG PO TBEC
40.0000 mg | DELAYED_RELEASE_TABLET | Freq: Every day | ORAL | Status: DC
  Administered 2021-08-22 – 2021-08-24 (×3): 40 mg via ORAL
  Filled 2021-08-22 (×3): qty 1

## 2021-08-22 MED ORDER — LACTATED RINGERS IV SOLN
INTRAVENOUS | Status: AC
  Administered 2021-08-22: 150 mL/h via INTRAVENOUS

## 2021-08-22 MED ORDER — ENOXAPARIN SODIUM 40 MG/0.4ML IJ SOSY
40.0000 mg | PREFILLED_SYRINGE | INTRAMUSCULAR | Status: DC
  Administered 2021-08-22 – 2021-08-24 (×2): 40 mg via SUBCUTANEOUS
  Filled 2021-08-22 (×3): qty 0.4

## 2021-08-22 MED ORDER — SODIUM CHLORIDE 0.9 % IV SOLN: 2.0000 g | INTRAVENOUS | Status: DC

## 2021-08-22 MED ORDER — ATORVASTATIN CALCIUM 80 MG PO TABS
80.0000 mg | ORAL_TABLET | Freq: Every day | ORAL | Status: DC
  Administered 2021-08-22 – 2021-08-24 (×3): 80 mg via ORAL
  Filled 2021-08-22 (×3): qty 1

## 2021-08-22 MED ORDER — BENZONATATE 100 MG PO CAPS
100.0000 mg | ORAL_CAPSULE | Freq: Three times a day (TID) | ORAL | Status: DC | PRN
  Administered 2021-08-22: 100 mg via ORAL
  Filled 2021-08-22: qty 1

## 2021-08-22 MED ORDER — AZITHROMYCIN 250 MG PO TABS: 250.0000 mg | ORAL_TABLET | Freq: Every day | ORAL | Status: DC

## 2021-08-22 MED ORDER — SODIUM CHLORIDE 0.9 % IV SOLN
500.0000 mg | INTRAVENOUS | Status: DC
  Administered 2021-08-22: 500 mg via INTRAVENOUS
  Filled 2021-08-22 (×3): qty 500

## 2021-08-22 MED ORDER — AZITHROMYCIN 250 MG PO TABS
250.0000 mg | ORAL_TABLET | Freq: Every day | ORAL | Status: DC
  Administered 2021-08-23 – 2021-08-24 (×2): 250 mg via ORAL
  Filled 2021-08-22 (×3): qty 1

## 2021-08-22 MED ORDER — SODIUM CHLORIDE 0.9 % IV SOLN
2.0000 g | Freq: Once | INTRAVENOUS | Status: AC
  Administered 2021-08-22: 2 g via INTRAVENOUS
  Filled 2021-08-22: qty 20

## 2021-08-22 MED ORDER — CEFTRIAXONE SODIUM 2 G IJ SOLR
2.0000 g | INTRAMUSCULAR | Status: DC
  Administered 2021-08-23 – 2021-08-24 (×2): 2 g via INTRAVENOUS
  Filled 2021-08-22 (×3): qty 20

## 2021-08-22 MED ORDER — LACTATED RINGERS IV BOLUS
1000.0000 mL | Freq: Once | INTRAVENOUS | Status: AC
  Administered 2021-08-22: 1000 mL via INTRAVENOUS

## 2021-08-22 NOTE — ED Triage Notes (Signed)
Patient reports SOB with chest congestion/persistent productive cough with nasal congestion/rhinorrhea and body aches for 2 weeks , hypotensive at triage BP =78/54 .

## 2021-08-22 NOTE — Progress Notes (Signed)
Elink following for sepsis protocol. 

## 2021-08-22 NOTE — ED Notes (Addendum)
Pt up to bathroom without assistance.  Pt is weak and advised and verbalized understanding to call for help

## 2021-08-22 NOTE — H&P (Signed)
Date: 08/22/2021               Patient Name:  Martin Mcdowell MRN: [id number]  DOB: 14-Jun-1951 Age / Sex: 70 y.o., male   PCP: Karl Ito, DO         Medical Service: Internal Medicine Teaching Service         Attending Physician: Dr. Mayford Knife, Dorene Ar, MD    First Contact: Gwenevere Abbot, MD Pager: Meryl Dare [phone]  Second Contact: Sharrell Ku, MD Pager: Meryl Dare [phone]       After Hours (After 5p/  First Contact Pager: [phone]  weekends / holidays): Second Contact Pager: [phone]   SUBJECTIVE  Chief Complaint: Cough, fatigue  History of Present Illness: Martin Mcdowell is a 70 y.o. male with a pertinent PMH of CAD, MI, GERD, hypertension, CHF, who presents to [facility] with cough, fatigue, dyspnea.    Symptoms started approximately 2 weeks ago.  Patient states that his initial symptom was just a mild cough.  He felt well enough to go to work this past Monday, but after working he appeared to be "floored" according to his wife at bedside.  Presents today after having no energy, and being unable to continue working at his job.  He states that he has had a productive cough, of yellowish mucus with no blood tinges.  He denies having fevers, headaches, abdominal pain, constipation, or diarrhea. He does endorse fatigue, nasal congestion, myalgias, and productive cough.  Per Mrs. Modesto Charon, both patient's had a "viral illness" a week and a half ago, and while hers alleviated Martin Mcdowell worsened.  In the ED, the patient was noted to have an elevated lactic acid of 2.3, leukocytosis of 14.4, with a creatinine of 1.72 from 0.8.  Did not require oxygen.  He was treated with ceftriaxone and azithromycin, and given IV fluid bolus.  Medications: No current facility-administered medications on file prior to encounter.   Current Outpatient Medications on File Prior to Encounter  Medication Sig Dispense Refill   atorvastatin (LIPITOR) 80 MG tablet TAKE 1 TABLET BY MOUTH EVERY DAY 90 tablet 1   clopidogrel (PLAVIX) 75  MG tablet Take 1 tablet (75 mg total) by mouth daily. 90 tablet 3   metoprolol succinate (TOPROL-XL) 25 MG 24 hr tablet Take 1 tablet (25 mg total) by mouth daily. 90 tablet 3   niacin 500 MG tablet Take 500 mg by mouth daily.     nitroGLYCERIN (NITROSTAT) 0.4 MG SL tablet Place 1 tablet (0.4 mg total) under the tongue every 5 (five) minutes x 3 doses as needed for chest pain. 25 tablet 3   pantoprazole (PROTONIX) 40 MG tablet Take 40 mg by mouth daily.     sacubitril-valsartan (ENTRESTO) 49-51 MG Take 1 tablet by mouth 2 (two) times daily. 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    Social:  Social History   Socioeconomic History   Marital  status: Married    Spouse name: Not on file   Number of children: Not on file   Years of education: Not on file   Highest education level: Not on file  Occupational History   Not on file  Tobacco Use   Smoking status: Former    Packs/day: 2.00    Years: 22.00    Pack years: 44.00    Types: Cigarettes    Quit date: 2    Years since quitting: 32.8   Smokeless tobacco: Never  Vaping Use   Vaping Use: Never used  Substance and Sexual Activity   Alcohol use: Yes    Alcohol/week: 2.0 - 3.0 standard drinks    Types: 2 - 3 Standard drinks or equivalent per week    Comment: Occ    Drug use: No   Sexual activity: Not on file  Other Topics Concern   Not on file  Social History Narrative   Not on file   Social Determinants of Health    Financial Resource Strain: Not on file  Food Insecurity: Not on file  Transportation Needs: Not on file  Physical Activity: Not on file  Stress: Not on file  Social Connections: Not on file  Intimate Partner Violence: Not on file    Lives -in [place] Occupation -trucker, 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  Drinks 1-3 EtOH per week, no drug use  Family History: Family History  Problem Relation Age of Onset   Colon cancer Neg Hx    Stomach cancer Neg Hx    Pancreatic cancer Neg Hx    Esophageal cancer Neg Hx    Rectal cancer Neg Hx     Allergies: Allergies as of 08/22/2021 - Review Complete 07/15/2021  Allergen Reaction Noted   Sulfamethoxazole-trimethoprim Anaphylaxis and Rash 12/13/2018    Review of Systems: A complete ROS was negative except as per HPI.   OBJECTIVE:  Physical Exam: Blood pressure 94/69, pulse (!) 101, temperature 98.5 F (36.9 C), temperature source Oral, resp. rate (!) 25, height 5\' 7"  (1.702 m), weight 74.8 kg, SpO2 99 %. Physical Exam Constitutional:      General: He is in acute distress.     Appearance: He is not ill-appearing, toxic-appearing or diaphoretic.     Comments: Patient awake and alert, appears in mild distress, answers questions appropriately  Cardiovascular:     Rate and Rhythm: Normal rate and regular rhythm.     Pulses: Normal pulses.     Heart sounds: Normal heart sounds. No murmur heard.   No friction rub. No gallop.  Pulmonary:     Comments: Tachypneic, RR 26, saturating at 95% on room air, decreased breath sounds in the right lower lobe, clear to auscultation bilaterally otherwise Musculoskeletal:     Right lower leg: No edema.     Left lower leg: No edema.  Skin:    General: Skin is warm and dry.  Neurological:     Mental Status: He is alert and oriented to person, place,  and time.  Psychiatric:        Behavior: Behavior normal.    Pertinent Labs: CBC    Component Value Date/Time   WBC 14.4 (H) 08/22/2021 0524   RBC 6.05 (H) 08/22/2021 0524   HGB 11.4 (L) 08/22/2021 0524   HGB 11.5 (L) 10/14/2020 1150   HCT 35.1 (L) 08/22/2021  0524   HCT 38.1 10/14/2020 1150   PLT 153 08/22/2021 0524   PLT 251 10/14/2020 1150   MCV 58.0 (L) 08/22/2021 0524   MCV 64 (L) 10/14/2020 1150   MCH 18.8 (L) 08/22/2021 0524   MCHC 32.5 08/22/2021 0524   RDW 18.0 (H) 08/22/2021 0524   RDW 18.5 (H) 10/14/2020 1150   LYMPHSABS 1.2 08/22/2021 0524   MONOABS 0.9 08/22/2021 0524   EOSABS 0.0 08/22/2021 0524   BASOSABS 0.0 08/22/2021 0524     CMP     Component Value Date/Time   NA 130 (L) 08/22/2021 0524   NA 140 10/14/2020 1150   K 4.1 08/22/2021 0524   CL 95 (L) 08/22/2021 0524   CO2 21 (L) 08/22/2021 0524   GLUCOSE 124 (H) 08/22/2021 0524   BUN 31 (H) 08/22/2021 0524   BUN 17 10/14/2020 1150   CREATININE 1.72 (H) 08/22/2021 0524   CREATININE 0.79 10/29/2014 1310   CALCIUM 8.3 (L) 08/22/2021 0524   PROT 6.6 08/22/2021 0524   PROT 5.8 (L) 06/25/2020 0945   ALBUMIN 2.5 (L) 08/22/2021 0524   ALBUMIN 3.7 (L) 06/25/2020 0945   AST 26 08/22/2021 0524   ALT 12 08/22/2021 0524   ALKPHOS 73 08/22/2021 0524   BILITOT 1.7 (H) 08/22/2021 0524   BILITOT 1.3 (H) 06/25/2020 0945   GFRNONAA 43 (L) 08/22/2021 0524   GFRAA 87 10/14/2020 1150    Pertinent Imaging: DG Chest Portable 1 View  Result Date: 08/22/2021 CLINICAL DATA:  Short of breath EXAM: PORTABLE CHEST 1 VIEW COMPARISON:  None. FINDINGS: Normal cardiac silhouette. RIGHT lower lobe density RIGHT lower lobe airspace density. LEFT lung clear. No acute osseous abnormality. IMPRESSION: RIGHT lower lobe pneumonia. Followup PA and lateral chest X-ray is recommended in 3-4 weeks following trial of antibiotic therapy to ensure resolution and exclude underlying malignancy. Electronically Signed   By: Genevive Bi  M.D.   On: 08/22/2021 06:43    EKG: personally reviewed my interpretation is sinus tachycardia, with occasional PVC  ASSESSMENT & PLAN:  Assessment: Active Problems:   Pneumonia   Martin Mcdowell is a 71 y.o. with pertinent PMH of CHF, CAD, GERD, and hypertension who presented with shortness of breath and fatigue and admit for right lower lobe pneumonia on hospital day 0  Plan: #RLL Pneumonia Patient presents with imaging consistent with consolidation, lactic acid of 2.3, leukocytosis of 14.4.  Started on azithromycin and ceftriaxone in the ED.  Curb 65 score three-point showing severe risk 14% 30-day mortality. Patient currently saturating well on room air with saturations of 95% during the interview. 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  His most recent ejection fraction of 35 to 40%.  His home regimen includes Entresto 49 to 51 mg twice daily, metoprolol 25 mg daily, and Plavix 75 mg. - Holding Entresto and metoprolol in setting of low pressures. - Continue Plavix 75 mg - Daily weights - Strict intake and output  #AKI Serum creatinine of 1.72 from a baseline of 0.8.  Likely in the setting of prerenal etiology given that the patient has had a poor p.o intake for the past week and a half.  Received a liter bolus of LR in the ED, with 150 cc an hour maintenance infusion. - Trend BMPs - Avoid nephrotoxic drugs  #Hyperlipidemia -Continue atorvastatin 80 mg  daily  #GERD - Continue Protonix 40 mg daily  #Hx of Dress Syndrome In the setting of Bactrim.  Best Practice: Diet: Cardiac diet IVF: Fluids: LR, Rate: 150 cc an hour VTE: enoxaparin (LOVENOX) injection 40 mg Start: 08/22/21 0845 Code: DNR AB: Ceftriaxone azithromycin Status: Inpatient with expected length of stay greater than 2 midnights. Anticipated  Discharge Location: Home Barriers to Discharge: Continue medical work-up  Signature: Dolan Amen, MD Internal Medicine Resident, PGY-3 [facility]  8:58 AM, 08/22/2021   Please contact the on call pager after 5 pm and on weekends at [phone].

## 2021-08-23 DIAGNOSIS — I959 Hypotension, unspecified: Secondary | ICD-10-CM | POA: Diagnosis not present

## 2021-08-23 LAB — URINE CULTURE
Culture: <10000 — AB
Special Requests: NORMAL

## 2021-08-23 LAB — BASIC METABOLIC PANEL
Anion gap: 6 (ref 5–15)
BUN: 15 mg/dL (ref 8–23)
CO2: 22 mmol/L (ref 22–32)
Calcium: 7.9 mg/dL — ABNORMAL LOW (ref 8.9–10.3)
Chloride: 103 mmol/L (ref 98–111)
Creatinine, Ser: 1.05 mg/dL (ref 0.61–1.24)
GFR, Estimated: >60 mL/min (ref >60)
Glucose, Bld: 117 mg/dL — ABNORMAL HIGH (ref 70–99)
Potassium: 3.7 mmol/L (ref 3.5–5.1)
Sodium: 131 mmol/L — ABNORMAL LOW (ref 135–145)

## 2021-08-23 LAB — CBC
HCT: 29.8 % — ABNORMAL LOW (ref 39.0–52.0)
Hemoglobin: 9.6 g/dL — ABNORMAL LOW (ref 13.0–17.0)
MCH: 18.7 pg — ABNORMAL LOW (ref 26.0–34.0)
MCHC: 32.2 g/dL (ref 30.0–36.0)
MCV: 58.1 fL — ABNORMAL LOW (ref 80.0–100.0)
Platelets: 120 10*3/uL — ABNORMAL LOW (ref 150–400)
RBC: 5.13 MIL/uL (ref 4.22–5.81)
RDW: 17 % — ABNORMAL HIGH (ref 11.5–15.5)
WBC: 12.4 10*3/uL — ABNORMAL HIGH (ref 4.0–10.5)
nRBC: 0 % (ref 0.0–0.2)

## 2021-08-23 MED ORDER — ENSURE ENLIVE PO LIQD
237.0000 mL | Freq: Two times a day (BID) | ORAL | Status: DC
  Administered 2021-08-23 – 2021-08-24 (×2): 237 mL via ORAL

## 2021-08-23 MED ORDER — GUAIFENESIN 100 MG/5ML PO LIQD
5.0000 mL | Freq: Four times a day (QID) | ORAL | Status: DC | PRN
Start: 2021-08-23 — End: 2021-08-24
  Administered 2021-08-23: 5 mL via ORAL
  Filled 2021-08-23: qty 5

## 2021-08-23 MED ORDER — ACETAMINOPHEN 325 MG PO TABS
650.0000 mg | ORAL_TABLET | Freq: Once | ORAL | Status: AC
  Administered 2021-08-23: 650 mg via ORAL
  Filled 2021-08-23: qty 2

## 2021-08-23 MED ORDER — IBUPROFEN 200 MG PO TABS: 400.0000 mg | ORAL_TABLET | Freq: Four times a day (QID) | ORAL | Status: DC | PRN

## 2021-08-23 MED ORDER — ACETAMINOPHEN 500 MG PO TABS
500.0000 mg | ORAL_TABLET | ORAL | Status: DC | PRN
  Administered 2021-08-23: 500 mg via ORAL
  Filled 2021-08-23: qty 1

## 2021-08-23 MED ORDER — LIDOCAINE 5 % EX PTCH
1.0000 | MEDICATED_PATCH | CUTANEOUS | Status: DC
  Administered 2021-08-23: 1 via TRANSDERMAL
  Filled 2021-08-23: qty 1

## 2021-08-23 NOTE — Plan of Care (Signed)
  Problem: Clinical Measurements: Goal: Respiratory complications will improve Outcome: Progressing   

## 2021-08-23 NOTE — Progress Notes (Addendum)
HD#1 SUBJECTIVE:  Patient Summary: Martin Mcdowell is a 70 y.o. with a pertinent PMH of CAD, ACS w/ stent, CHF, GERD, and HTN who presented with productive cough, fatigue and dyspnea and admitted for community-acquired pneumonia.   Overnight Events: No acute events overnight.  Interim History: Pt was seen and examined at bedside this AM. He reports improvement in his overall condition. He is breathing better with less pleurisy and coughing. He has unilateral mild non-pitting edema of his LLE and states it changes in severity and is due to his lymph nodes. He denies RLE edema even when his LLE has increased edema. Denies any pain in his legs. He does complain of pain in his left shoulder over his proximal humerus which is new, sharp, and worsens with movement. He notes a fall 2-3 days ago where he braced himself with his arms but no other known prior shoulder injury. He did have shingles about 1 month ago over his left side in the T1-T3 dermatomal area which has resolved without residual pain. He denies any other new or worsening complaints.  OBJECTIVE:  Vital Signs: Vitals:   08/22/21 2300 08/23/21 0027 08/23/21 0429 08/23/21 0841  BP:  99/61 116/69 91/66  Pulse:  (!) 104 (!) 116 86  Resp:  18 16 17   Temp: 98.4 F (36.9 C) 99.6 F (37.6 C) (!) 100.9 F (38.3 C) (!) 97.3 F (36.3 C)  TempSrc:  Oral Oral   SpO2:  91% 93% 94%  Weight:      Height:       Supplemental O2: Room Air SpO2: 94 %  Filed Weights   08/22/21 0804  Weight: 74.8 kg     Intake/Output Summary (Last 24 hours) at 08/23/2021 1219 Last data filed at 08/23/2021 0100 Gross per 24 hour  Intake 2250.5 ml  Output --  Net 2250.5 ml   Net IO Since Admission: 3,598.94 mL [08/23/21 1219]  Physical Exam: Physical Exam Constitutional:      General: He is not in acute distress.    Appearance: Normal appearance.  HENT:     Head: Normocephalic and atraumatic.  Eyes:     Extraocular Movements: Extraocular movements  intact.  Cardiovascular:     Rate and Rhythm: Normal rate and regular rhythm.  Pulmonary:     Effort: Pulmonary effort is normal.     Breath sounds: Examination of the right-lower field reveals decreased breath sounds. Decreased breath sounds present.  Abdominal:     General: Bowel sounds are normal.     Tenderness: There is no abdominal tenderness.  Musculoskeletal:        General: Tenderness (Over left bicipital groove) present.     Right lower leg: No edema.     Left lower leg: Edema (1+ non-pitting) present.     Comments: Decreased active and passive ROM 2/2 pain of the left shoulder in all directions. No sensory or motor deficit in bilateral upper extremity.   Skin:    General: Skin is warm and dry.     Comments: Multiple hyperpigmented papules over left T1-T3 dermatome on the back, non-tender.   Neurological:     Mental Status: He is alert and oriented to person, place, and time.  Psychiatric:        Mood and Affect: Mood normal.    Patient Lines/Drains/Airways Status     Active Line/Drains/Airways     Name Placement date Placement time Site Days   Peripheral IV 08/22/21 20 G Anterior;Distal;Left;Upper Arm 08/22/21  0600  Arm  1   Closed System Drain 1 Right Abdomen Bulb (JP) 19 Fr. 12/17/20  1027  Abdomen  249   Incision (Closed) 12/17/20 Abdomen 12/17/20  0935  -- 249   Incision - 3 Ports Abdomen Umbilicus Mid;Upper Right;Upper 12/17/20  1011  -- 249            Pertinent Labs: CBC Latest Ref Rng & Units 08/23/2021 08/22/2021 12/18/2020  WBC 4.0 - 10.5 K/uL 12.4(H) 14.4(H) 6.2  Hemoglobin 13.0 - 17.0 g/dL 9.6(L) 11.4(L) 9.7(L)  Hematocrit 39.0 - 52.0 % 29.8(L) 35.1(L) 30.2(L)  Platelets 150 - 400 K/uL 120(L) 153 166    CMP Latest Ref Rng & Units 08/23/2021 08/22/2021 08/22/2021  Glucose 70 - 99 mg/dL 117(H) 112(H) 124(H)  BUN 8 - 23 mg/dL 15 20 31(H)  Creatinine 0.61 - 1.24 mg/dL 1.05 1.16 1.72(H)  Sodium 135 - 145 mmol/L 131(L) 131(L) 130(L)  Potassium 3.5 -  5.1 mmol/L 3.7 3.8 4.1  Chloride 98 - 111 mmol/L 103 100 95(L)  CO2 22 - 32 mmol/L 22 21(L) 21(L)  Calcium 8.9 - 10.3 mg/dL 7.9(L) 7.8(L) 8.3(L)  Total Protein 6.5 - 8.1 g/dL - - 6.6  Total Bilirubin 0.3 - 1.2 mg/dL - - 1.7(H)  Alkaline Phos 38 - 126 U/L - - 73  AST 15 - 41 U/L - - 26  ALT 0 - 44 U/L - - 12    Pertinent Imaging: No results found.  ASSESSMENT/PLAN:  Assessment: Active Problems:   Pneumonia  Martin Mcdowell is a 70 y.o. with a pertinent PMH of CAD, ACS w/ stent, CHF, GERD, and HTN who presented with productive cough, fatigue and dyspnea and admitted for community-acquired pneumonia on hospital day 2.  Plan: #RLL Community Acquired Pneumonia Patient presents with imaging consistent with consolidation, productive cough, and dyspnea. Showing improving respiratory status after initiation of antibiotics. Has not had an oxygen requirement.  - Continue ceftriaxone 2 g daily and azithromycin 500 mg daily for 7 days. Day 2/7 - Transition ceftriaxone to oral alternative tomorrow.  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  His most recent ejection fraction was 40-45% in 02/2021.  His home regimen includes Entresto 49-51 mg twice daily, metoprolol 25 mg daily, and Plavix 75 mg. - Holding Entresto and metoprolol in setting of low pressures. - Continue Plavix 75 mg - Daily weights - Strict intake and output   #AKI Serum creatinine of 1.72 from a baseline of 0.8. Likely prerenal etiology given that the patient has had a poor p.o intake for the past week and a half. Received a liter bolus of LR in the ED, with 150 cc an hour maintenance infusion until 0300 on 10/31. Cr trending down to baseline 1.05. - Trend BMPs - Avoid nephrotoxic drugs  #Left Shoulder Pain Likely 2/2 to acute injury after recent fall. Complains of new onset sharp left shoulder pain that  worsens with movement. Pain is over the bicipital groove and tender to palpation at that point. Decreased active and passive ROM in left shoulder, primarily flexion and extension. No motor or sensory deficits in bilateral UE. - Tylenol  - lidocaine patch   #Hyperlipidemia -Continue atorvastatin 80 mg daily  #GERD - Continue Protonix 40 mg daily  #Hx of Herpes Zoster Pt recently had shingles and has residual healing hyperpigmented scars on his back, left T1-T3  dermatome. No post-herpetic neuralgia.   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  Best Practice: Diet: Cardiac diet IVF: Fluids: None VTE: enoxaparin (LOVENOX) injection 40 mg Start: 08/22/21 1000 Code: DNR AB: Ceftriaxone and Azithromycin  Therapy Recs: None, DME: none DISPO: Anticipated discharge in 1-2 days to Home pending Medical stability.  Signature: Johny Blamer Medical Student, [employer] [facility] Pager: [phone] 12:19 PM, 08/23/2021   Please contact the on call pager after 5 pm and on weekends at [phone].

## 2021-08-23 NOTE — Progress Notes (Signed)
   08/22/21 1923  Assess: MEWS Score  Temp 99.1 F (37.3 C)  BP 98/64  Pulse Rate (!) 110  Resp 18  Level of Consciousness Alert  SpO2 94 %  O2 Device Room Air  Assess: MEWS Score  MEWS Temp 0  MEWS Systolic 1  MEWS Pulse 1  MEWS RR 0  MEWS LOC 0  MEWS Score 2  MEWS Score Color Yellow  Assess: if the MEWS score is Yellow or Red  Were vital signs taken at a resting state? Yes  Focused Assessment No change from prior assessment  Early Detection of Sepsis Score *See Row Information* Medium  MEWS guidelines implemented *See Row Information* No, previously red, continue vital signs every 4 hours  Treat  MEWS Interventions Administered scheduled meds/treatments  Pain Scale 0-10  Pain Score 0  Take Vital Signs  Increase Vital Sign Frequency  Yellow: Q 2hr X 2 then Q 4hr X 2, if remains yellow, continue Q 4hrs  Escalate  MEWS: Escalate Yellow: discuss with charge nurse/RN and consider discussing with provider and RRT  Notify: Charge Nurse/RN  Name of Charge Nurse/RN Notified Ivana  Date Charge Nurse/RN Notified 08/22/21  Time Charge Nurse/RN Notified 2000  Notify: Provider  Provider Name/Title NA  Notify: Rapid Response  Name of Rapid Response RN Notified NA  Document  Patient Outcome Stabilized after interventions  Progress note created (see row info) Yes  Patient was red MEWs in the ED and is yellow MEWS for the same reason.  Patient is coughing PRN med given, no chest pain except when coughing, will continue to monitor

## 2021-08-23 NOTE — ED Provider Notes (Signed)
[facility] Provider Note   CSN: [id number] Arrival date & time: 08/22/21  0456     History Chief Complaint  Patient presents with   Hypotensive /SOB     Martin Mcdowell is a 70 y.o. male.   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    Patient Active Problem List   Diagnosis Date Noted   Pneumonia 08/22/2021   RUQ abdominal pain    Choledocholithiasis with acute cholecystitis 12/14/2020   Essential hypertension 12/14/2020   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/14/2020   Acute biliary pancreatitis without infection or necrosis    Cholelithiasis with choledocholithiasis    Hyperlipidemia LDL goal <70 05/19/2020   CAD (coronary artery disease), native coronary artery 05/18/2020   STEMI (ST elevation myocardial infarction) (HCC) 05/15/2020   Acute ST elevation myocardial infarction (STEMI) involving left anterior descending (LAD) coronary artery (HCC)    Acute esophagitis 03/06/2019   DRESS syndrome 12/23/2018   Anemia 12/23/2018   GERD (gastroesophageal reflux disease) 12/23/2018   Toxoplasmosis 12/23/2018   Epigastric pain 09/18/2018   Decreased libido  09/18/2018   Fatigue 08/07/2018   Cough 08/07/2018   Screening for colon cancer 08/07/2018    Past Surgical History:  Procedure Laterality Date   CHOLECYSTECTOMY N/A 12/17/2020   Procedure: LAPAROSCOPIC CHOLECYSTECTOMY WITH INTRAOPERATIVE CHOLANGIOGRAM;  Surgeon: Sheliah Hatch De Blanch, MD;  Location: [facility] OR;  Service: General;  Laterality: N/A;  ROOM 1 STARTING AT 09:00AM FOR 90 MIN   CORONARY STENT INTERVENTION N/A 05/18/2020   Procedure: CORONARY STENT INTERVENTION;  Surgeon: Lyn Records, MD;  Location: [facility] INVASIVE CV LAB;  Service: Cardiovascular;  Laterality: N/A;   CORONARY/GRAFT ACUTE MI REVASCULARIZATION N/A 05/15/2020   Procedure: CORONARY/GRAFT ACUTE MI REVASCULARIZATION;  Surgeon: Lyn Records, MD;  Location: [facility] INVASIVE CV LAB;  Service: Cardiovascular;  Laterality: N/A;       Family History  Problem Relation Age of Onset   Colon cancer Neg Hx    Stomach cancer Neg Hx    Pancreatic cancer Neg Hx    Esophageal cancer Neg Hx    Rectal cancer Neg Hx     Social History   Tobacco Use   Smoking status: Former    Packs/day: 2.00    Years: 22.00    Pack years: 44.00    Types: Cigarettes    Quit date: 1990    Years since quitting: 32.8   Smokeless tobacco: Never  Vaping Use   Vaping Use: Never used  Substance Use Topics   Alcohol use: Yes    Alcohol/week: 2.0 - 3.0 standard drinks    Types: 2 -  3 Standard drinks or equivalent per week    Comment: Occ    Drug use: No    Home Medications Prior to Admission medications   Medication Sig Start Date End Date Taking? Authorizing Provider  atorvastatin (LIPITOR) 80 MG tablet TAKE 1 TABLET BY MOUTH EVERY DAY 06/18/21  Yes Azalee Course, PA  clopidogrel (PLAVIX) 75 MG tablet Take 1 tablet (75 mg total) by mouth daily. 01/22/21  Yes Lyn Records, MD  metoprolol succinate (TOPROL-XL) 25 MG 24 hr tablet Take 1 tablet (25 mg total) by mouth daily. Patient taking differently: Take 25 mg by mouth in the morning and at bedtime.  01/22/21  Yes Lyn Records, MD  niacin 500 MG tablet Take 500 mg by mouth daily.   Yes [provider]  nitroGLYCERIN (NITROSTAT) 0.4 MG SL tablet Place 1 tablet (0.4 mg total) under the tongue every 5 (five) minutes x 3 doses as needed for chest pain. 07/29/21  Yes Lyn Records, MD  pantoprazole (PROTONIX) 40 MG tablet Take 40 mg by mouth daily.   Yes [provider]  sacubitril-valsartan (ENTRESTO) 49-51 MG Take 1 tablet by mouth 2 (two) times daily. Start 1/2 pill when SBP >110 06/21/21  Yes Lyn Records, MD  sildenafil (VIAGRA) 50 MG tablet Take 50 mg by mouth daily as needed. 08/06/21  Yes [provider]    Allergies    Sulfamethoxazole-trimethoprim  Review of Systems   Review of Systems  Constitutional:  Positive for fever.  Respiratory:  Positive for cough and shortness of breath.   All other systems reviewed and are negative.  Physical Exam Updated Vital Signs BP 99/61 (BP Location: Left Arm)   Pulse (!) 104   Temp 99.6 F (37.6 C) (Oral)   Resp 18   Ht 5\' 7"  (1.702 m)   Wt 74.8 kg   SpO2 91%   BMI 25.84 kg/m   Physical Exam Vitals and nursing note reviewed.  Constitutional:      Appearance: He is well-developed.  HENT:     Head: Normocephalic and atraumatic.     Mouth/Throat:     Mouth: Mucous membranes are moist.     Pharynx: Oropharynx is clear.  Eyes:     Pupils: Pupils are equal, round, and reactive to light.  Cardiovascular:     Rate and Rhythm: Tachycardia present.  Pulmonary:     Effort: Pulmonary effort is normal. Tachypnea present. No respiratory distress.     Breath sounds: Rhonchi present.  Abdominal:     General: There is no distension.  Musculoskeletal:        General: Normal range of motion.     Cervical back: Normal range of motion.  Skin:    General: Skin is warm and dry.     Coloration: Skin is not jaundiced or pale.  Neurological:     General: No focal deficit present.     Mental Status: He is alert.     ED Results / Procedures / Treatments   Labs (all labs ordered are listed, but only abnormal results are displayed) Labs Reviewed  CBC WITH DIFFERENTIAL/PLATELET - Abnormal; Notable for the following components:      Result Value   WBC 14.4 (*)    RBC 6.05 (*)    Hemoglobin 11.4 (*)    HCT 35.1 (*)    MCV 58.0 (*)    MCH 18.8 (*)    RDW 18.0 (*)    Neutro Abs 12.4 (*)  All other components within normal limits  COMPREHENSIVE METABOLIC PANEL - Abnormal; Notable for the following components:   Sodium 130 (*)    Chloride 95 (*)    CO2 21 (*)    Glucose, Bld 124 (*)    BUN 31 (*)    Creatinine, Ser 1.72 (*)    Calcium 8.3 (*)    Albumin 2.5 (*)    Total Bilirubin 1.7 (*)    GFR, Estimated 43 (*)    All other components within normal limits  LACTIC ACID, PLASMA - Abnormal; Notable for the following components:   Lactic Acid, Venous 2.3 (*)    All other components within normal limits  URINALYSIS, ROUTINE W REFLEX MICROSCOPIC - Abnormal; Notable for the following components:   APPearance CLOUDY (*)    Hgb urine dipstick SMALL (*)    Protein, ur 30 (*)    All other components within normal limits  BASIC METABOLIC PANEL - Abnormal; Notable for the following components:   Sodium 131 (*)    CO2 21 (*)    Glucose, Bld 112 (*)    Calcium 7.8 (*)    All other components within normal limits  RESP PANEL BY RT-PCR (FLU A&B, COVID) ARPGX2  CULTURE, BLOOD (ROUTINE X 2)  CULTURE, BLOOD (ROUTINE X 2)  URINE CULTURE  LACTIC ACID, PLASMA  HIV ANTIBODY (ROUTINE TESTING W REFLEX)  BASIC METABOLIC PANEL  CBC    EKG None  Radiology DG Chest Portable 1 View  Result Date: 08/22/2021 CLINICAL DATA:  Short of breath EXAM: PORTABLE CHEST 1 VIEW COMPARISON:  None. FINDINGS: Normal cardiac silhouette. RIGHT lower lobe density RIGHT lower lobe airspace density. LEFT lung clear. No acute osseous abnormality. IMPRESSION: RIGHT lower lobe pneumonia. Followup PA and lateral chest X-ray is  recommended in 3-4 weeks following trial of antibiotic therapy to ensure resolution and exclude underlying malignancy. Electronically Signed   By: Genevive Bi M.D.   On: 08/22/2021 06:43    Procedures .Critical Care Performed by: Marily Memos, MD Authorized by: Marily Memos, MD   Critical care provider statement:    Critical care time (minutes):  32   Critical care time was exclusive of:  Separately billable procedures and treating other patients and teaching time   Critical care was necessary to treat or prevent imminent or life-threatening deterioration of the following conditions:  Sepsis, shock and metabolic crisis   Critical care was time spent personally by me on the following activities:  Development of treatment plan with patient or surrogate, evaluation of patient's response to treatment, examination of patient, obtaining history from patient or surrogate, review of old charts, re-evaluation of patient's condition, pulse oximetry, ordering and performing treatments and interventions and ordering and review of laboratory studies   Medications Ordered in ED Medications  lactated ringers infusion ( Intravenous New Bag/Given 08/22/21 2328)  enoxaparin (LOVENOX) injection 40 mg (40 mg Subcutaneous Given 08/22/21 1113)  azithromycin (ZITHROMAX) tablet 250 mg (has no administration in time range)  cefTRIAXone (ROCEPHIN) 2 g in sodium chloride 0.9 % 100 mL IVPB (has no administration in time range)  guaiFENesin (MUCINEX) 12 hr tablet 600 mg (600 mg Oral Given 08/22/21 2025)  atorvastatin (LIPITOR) tablet 80 mg (80 mg Oral Given 08/22/21 1114)  clopidogrel (PLAVIX) tablet 75 mg (75 mg Oral Given 08/22/21 1115)  pantoprazole (PROTONIX) EC tablet 40 mg (40 mg Oral Given 08/22/21 1115)  benzonatate (TESSALON) capsule 100 mg (100 mg Oral Given 08/22/21 2025)  feeding supplement (ENSURE ENLIVE /  ENSURE PLUS) liquid 237 mL (has no administration in time range)  lactated ringers bolus 1,000  mL (0 mLs Intravenous Stopped 08/22/21 0816)  cefTRIAXone (ROCEPHIN) 2 g in sodium chloride 0.9 % 100 mL IVPB (0 g Intravenous Stopped 08/22/21 0708)  lactated ringers bolus 1,000 mL (0 mLs Intravenous Stopped 08/22/21 1115)    ED Course  I have reviewed the triage vital signs and the nursing notes.  Pertinent labs & imaging results that were available during my care of the patient were reviewed by me and considered in my medical decision making (see chart for details).    MDM Rules/Calculators/A&P                         Here with weakness and AKI likely related sepsis from pneumonia. Fluids/abx given plan to admit for same.   Final Clinical Impression(s) / ED Diagnoses Final diagnoses:  None    Rx / DC Orders ED Discharge Orders     None        Laaibah Wartman, Barbara Cower, MD 08/25/21 [phone]

## 2021-08-24 DIAGNOSIS — J189 Pneumonia, unspecified organism: Secondary | ICD-10-CM | POA: Diagnosis not present

## 2021-08-24 LAB — BASIC METABOLIC PANEL
Anion gap: 8 (ref 5–15)
BUN: 16 mg/dL (ref 8–23)
CO2: 21 mmol/L — ABNORMAL LOW (ref 22–32)
Calcium: 8 mg/dL — ABNORMAL LOW (ref 8.9–10.3)
Chloride: 104 mmol/L (ref 98–111)
Creatinine, Ser: 0.95 mg/dL (ref 0.61–1.24)
GFR, Estimated: >60 mL/min (ref >60)
Glucose, Bld: 107 mg/dL — ABNORMAL HIGH (ref 70–99)
Potassium: 4 mmol/L (ref 3.5–5.1)
Sodium: 133 mmol/L — ABNORMAL LOW (ref 135–145)

## 2021-08-24 LAB — CBC
HCT: 30.2 % — ABNORMAL LOW (ref 39.0–52.0)
Hemoglobin: 9.8 g/dL — ABNORMAL LOW (ref 13.0–17.0)
MCH: 18.5 pg — ABNORMAL LOW (ref 26.0–34.0)
MCHC: 32.5 g/dL (ref 30.0–36.0)
MCV: 57.1 fL — ABNORMAL LOW (ref 80.0–100.0)
Platelets: 152 10*3/uL (ref 150–400)
RBC: 5.29 MIL/uL (ref 4.22–5.81)
RDW: 16.9 % — ABNORMAL HIGH (ref 11.5–15.5)
WBC: 12 10*3/uL — ABNORMAL HIGH (ref 4.0–10.5)
nRBC: 0 % (ref 0.0–0.2)

## 2021-08-24 MED ORDER — ALBUTEROL SULFATE HFA 108 (90 BASE) MCG/ACT IN AERS: 1.0000 | INHALATION_SPRAY | RESPIRATORY_TRACT | Status: DC | PRN

## 2021-08-24 MED ORDER — AMOXICILLIN 500 MG PO CAPS: 1000.0000 mg | ORAL_CAPSULE | Freq: Three times a day (TID) | ORAL | 0 refills | Status: AC

## 2021-08-24 MED ORDER — ALBUTEROL SULFATE HFA 108 (90 BASE) MCG/ACT IN AERS: 2.0000 | INHALATION_SPRAY | Freq: Four times a day (QID) | RESPIRATORY_TRACT | 1 refills | Status: DC | PRN

## 2021-08-24 NOTE — Hospital Course (Addendum)
#  RLL Community Acquired Pneumonia Patient presented with progressive productive cough, dyspnea, fatigue, and pleurisy with imaging consistent with consolidation in the RLL. He has a known airspace consolidation in the RLL which has not given him any issues in the past. Showed improving respiratory status after initiation of antibiotics. Did not have an oxygen requirement. Initially started on ceftriaxone 2 g daily and azithromycin 500 mg daily. Transitioned ceftriaxone to amoxicillin 1 g TID and stopped azithromycin. Pt did not show any signs of systemic infection and blood Cx showed no growth during admission. Discussed OTC cough suppressants as needed. Will need follow up CXR in 1 month due to RLL defect.    #HFrEF #History of Anterior MI Patient with history of anterior MI 2021 requiring stenting.  His most recent ejection fraction was 40-45% in 02/2021.  His home regimen includes Entresto 49-51 mg twice daily, metoprolol 25 mg daily, and Plavix 75 mg. We held [facility] and metoprolol in setting of low pressures. Continued Plavix 75 mg. Will restart metoprolol and hold entresto due to BP being low normal during admission. Consider resuming entresto at PCP follow up.   #AKI Serum creatinine of 1.72 from a baseline of 0.8. Likely prerenal etiology given that the patient had a poor PO intake for the past week and a half. Received a liter bolus of LR in the ED, with 150 cc an hour maintenance infusion until 0300 on 10/31. Cr trended down to baseline 1.05.   #Left Shoulder Pain Likely 2/2 to acute injury after recent fall in which he braced himself with his arms. Complained of new onset sharp left shoulder pain that worsens with movement. Pain is over the bicipital groove and tender to palpation at that point. Decreased active and passive ROM in left shoulder, primarily flexion and extension. No motor or sensory deficits in bilateral UE. No radicular pain, negative Spurling bilaterally. Treated with tylenol  and lidocaine patch with benefit. Discussed OTC lidocaine patches.    #Hyperlipidemia #GERD Continued home atorvastatin 80 mg daily and Protonix 40 mg daily.   #Hx of Herpes Zoster Pt had shingles in 06/2021 and has residual healing hyperpigmented scars on his back, left T1-T3 dermatome. No post-herpetic neuralgia.   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

## 2021-08-24 NOTE — Progress Notes (Signed)
PIV removed. Discharge instructions completed. Patient and wife verbalized understanding of medication regimen, follow up appointments and discharge instructions. Patient belongings gathered and packed to discharge. Wheeled to DC area by tech.

## 2021-08-24 NOTE — Discharge Summary (Addendum)
Name: Akai Dollard MRN: [id number] DOB: 29-Jul-1951 70 y.o. PCP: Ellyn Hack, MD  Date of Admission: 08/22/2021  5:01 AM Date of Discharge: 08/24/2021 Attending Physician: Miguel Aschoff, MD  Discharge Diagnosis: 1. Community Acquired Pneumonia, RLL  Discharge Medications: Allergies as of 08/24/2021       Reactions   Sulfamethoxazole-trimethoprim Anaphylaxis, Rash   DRESS syndrome requiring hospitalization; 12/11/2018        Medication List     STOP taking these medications    Entresto 49-51 MG Generic drug: sacubitril-valsartan       TAKE these medications    albuterol 108 (90 Base) MCG/ACT inhaler Commonly known as: VENTOLIN HFA Inhale 2 puffs into the lungs every 6 (six) hours as needed for wheezing or shortness of breath.   amoxicillin 500 MG capsule Commonly known as: AMOXIL Take 2 capsules (1,000 mg total) by mouth 3 (three) times daily for 4 days.   atorvastatin 80 MG tablet Commonly known as: LIPITOR TAKE 1 TABLET BY MOUTH EVERY DAY   clopidogrel 75 MG tablet Commonly known as: PLAVIX Take 1 tablet (75 mg total) by mouth daily.   metoprolol succinate 25 MG 24 hr tablet Commonly known as: TOPROL-XL Take 1 tablet (25 mg total) by mouth daily. What changed: when to take this   niacin 500 MG tablet Take 500 mg by mouth daily.   nitroGLYCERIN 0.4 MG SL tablet Commonly known as: NITROSTAT Place 1 tablet (0.4 mg total) under the tongue every 5 (five) minutes x 3 doses as needed for chest pain.   pantoprazole 40 MG tablet Commonly known as: PROTONIX Take 40 mg by mouth daily.   sildenafil 50 MG tablet Commonly known as: VIAGRA Take 50 mg by mouth daily as needed.        Disposition and follow-up:   Mr.Hussien Kreitzer was discharged from [facility] in Stable condition.  At the hospital follow up visit please address:  1.  Follow Up:  Pneumonia - follow up CXR in 1 month and monitor response to antibiotic course.   Start albuterol inhaler, inhale 2 puffs into lungs every 6 hours as needed for wheezing or shortness of breath  2.  Labs / imaging needed at time of follow-up: CXR in 1 month  3.  Pending labs/ test needing follow-up: Blood Cultures  4. Medication Changes:  Start Amoxicillin 1 g TID for 4 more days to finish 7 day course on 08/28/2021.   Hold Entresto until PCP follow up.   Follow-up Appointments:  Follow-up Information     Ellyn Hack, MD Follow up today.   Specialty: Family Medicine Why: Call to get a hospital follow up visit within the next 1-2 weeks. Contact information: [address] [place] [address] [phone]         Lyn Records, MD .   Specialty: Cardiology Contact information: [phone] [address] [place] [address] [phone]                 Hospital Course by problem list: #RLL Community Acquired Pneumonia Patient presented with progressive productive cough, dyspnea, fatigue, and pleurisy with imaging consistent with consolidation in the RLL. He has a known airspace consolidation in the RLL which has not given him any issues in the past. Showed improving respiratory status after initiation of antibiotics. Did not have an oxygen requirement. Initially started on ceftriaxone 2 g daily and azithromycin 500 mg daily. Transitioned ceftriaxone to amoxicillin 1 g  TID and stopped azithromycin. Pt did not show any signs of systemic infection and blood Cx showed no growth during admission. Discussed OTC cough suppressants as needed. Will need follow up CXR in 1 month due to RLL defect. Patient was discharged home on albuterol inhaler as needed for shortness of breath or wheezing.   #HFrEF #History of Anterior MI Patient with history of anterior MI 2021 requiring stenting.  His most recent ejection fraction was 40-45% in 02/2021.  His home regimen includes Entresto 49-51 mg twice daily, metoprolol 25 mg daily, and Plavix 75 mg. We held  [facility] and metoprolol in setting of low pressures. Continued Plavix 75 mg. Will restart metoprolol and hold entresto due to BP being low normal during admission. Consider resuming entresto at PCP follow up.   #AKI Serum creatinine of 1.72 from a baseline of 0.8. Likely prerenal etiology given that the patient had a poor PO intake for the past week and a half. Received a liter bolus of LR in the ED, with 150 cc an hour maintenance infusion until 0300 on 10/31. Cr trended down to baseline 1.05.   #Left Shoulder Pain Likely 2/2 to acute injury after recent fall in which he braced himself with his arms. Complained of new onset sharp left shoulder pain that worsens with movement. Pain is over the bicipital groove and tender to palpation at that point. Decreased active and passive ROM in left shoulder, primarily flexion and extension. No motor or sensory deficits in bilateral UE. No radicular pain, negative Spurling bilaterally. Treated with tylenol and lidocaine patch with benefit. Discussed OTC lidocaine patches.    #Hyperlipidemia #GERD Continued home atorvastatin 80 mg daily and Protonix 40 mg daily.   #Hx of Herpes Zoster Pt had shingles in 06/2021 and has residual healing hyperpigmented scars on his back, left T1-T3 dermatome. No post-herpetic neuralgia.   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    Discharge Subjective: Pt was seen and examined at bedside. He states his cough is improving and he is not coughing up bloody mucus. He states his pleurisy has resolved. He denies any worsening shortness of breath or chest pain and states his previous left shoulder pain has resolved with lidocaine patch.   Discharge Exam:   BP 110/67 (BP Location: Right Arm)   Pulse (!) 103   Temp 99.4 F (37.4 C) (Oral)   Resp 18   Ht 5\' 7"  (1.702 m)   Wt 74.8 kg   SpO2 95%   BMI 25.84 kg/m  Discharge exam:  Physical Exam Constitutional:      General: He is not in acute distress.     Appearance: Normal appearance.  HENT:     Head: Normocephalic and atraumatic.  Eyes:     Extraocular Movements: Extraocular movements intact.  Cardiovascular:     Rate and Rhythm: Normal rate and regular rhythm.  Pulmonary:     Effort: Pulmonary effort is normal.     Breath sounds: Examination of the right-lower field reveals decreased breath sounds. Decreased breath sounds present.     Comments: Dullness to percussion over right lung base. Abdominal:     General: Bowel sounds are normal.     Tenderness: There is no abdominal tenderness.  Musculoskeletal:     Right lower leg: No edema.     Left lower leg: Edema (1+ non-pitting) present.     Comments: Full ROM of bilateral shoulders.  Skin:    General: Skin is warm and dry.  Comments: Multiple hyperpigmented papules over left T1-T3 dermatome on the back, non-tender.   Neurological:     Mental Status: He is alert and oriented to person, place, and time.  Psychiatric:        Mood and Affect: Mood normal.     Pertinent Labs, Studies, and Procedures:  Results for orders placed or performed during the hospital encounter of 08/22/21 (from the past 24 hour(s))  CBC     Status: Abnormal   Collection Time: 08/24/21  2:45 AM  Result Value Ref Range   WBC 12.0 (H) 4.0 - 10.5 K/uL   RBC 5.29 4.22 - 5.81 MIL/uL   Hemoglobin 9.8 (L) 13.0 - 17.0 g/dL   HCT 96.2 (L) 83.6 - 62.9 %   MCV 57.1 (L) 80.0 - 100.0 fL   MCH 18.5 (L) 26.0 - 34.0 pg   MCHC 32.5 30.0 - 36.0 g/dL   RDW 47.6 (H) 54.6 - 50.3 %   Platelets 152 150 - 400 K/uL   nRBC 0.0 0.0 - 0.2 %  Basic metabolic panel     Status: Abnormal   Collection Time: 08/24/21  2:45 AM  Result Value Ref Range   Sodium 133 (L) 135 - 145 mmol/L   Potassium 4.0 3.5 - 5.1 mmol/L   Chloride 104 98 - 111 mmol/L   CO2 21 (L) 22 - 32 mmol/L   Glucose, Bld 107 (H) 70 - 99 mg/dL   BUN 16 8 - 23 mg/dL   Creatinine, Ser 5.46 0.61 - 1.24 mg/dL   Calcium 8.0 (L) 8.9 - 10.3 mg/dL   GFR, Estimated  >56 >81 mL/min   Anion gap 8 5 - 15     Discharge Instructions: Dear Mr. Modesto Charon, I am so glad you are feeling better and can be discharged today (08/24/2021)! You were admitted because of community acquired pneumonia in your right lower lung.   Please see the following instructions: Please see your primary care / family doctor within 1 WEEK of leaving the hospital for a HOSPITAL FOLLOW-UP APPOINTMENT. Please make an appointment and follow-up with the other specialists seen below. NEW meds:  Amoxicillin 1 g three times a day for 4 days. Last dose will be in the evening of 08/28/2021. You may take Robitussin DM for your cough as needed and lidocaine gel or patches for your shoulder pain as needed. CONTINUE home meds: Make sure you do not take your Viagra within 24 hrs of taking your nitroglycerin. As prescribed. STOP meds:  Entresto Until follow up with primary care. May restart at primary care physician's discretion.  If you experience worsening shortness of breath, cough, bloody or dark mucus, chest pain, fevers, chills, or sever fatigue call 911.  It was a pleasure meeting you, Mr. Huge.  I wish you and your family the best, and hope you stay happy and healthy!  Rocky Morel, Medical Student 08/24/2021    Signed: Steffanie Rainwater, MD 08/24/2021, 3:35 PM

## 2021-08-24 NOTE — Discharge Instructions (Addendum)
Dear Mr. Martin Mcdowell, I am so glad you are feeling better and can be discharged today (08/24/2021)! You were admitted because of community acquired pneumonia in your right lower lung.   Please see the following instructions: Please see your primary care / family doctor within 1 WEEK of leaving the hospital for a HOSPITAL FOLLOW-UP APPOINTMENT. Please make an appointment and follow-up with the other specialists seen below. NEW meds:  Amoxicillin 1 g three times a day for 4 days. Last dose will be in the evening of 08/28/2021. Albuterol inhaler, inhale 2 puffs into lungs every 6 hours as needed for wheezing or shortness of breath You may take Robitussin DM for your cough as needed and lidocaine gel or patches for your shoulder pain as needed. CONTINUE home meds: As prescribed. STOP meds:  Entresto Until follow up with primary care. May restart at primary care physician's discretion.  If you experience worsening shortness of breath, cough, bloody or dark mucus, chest pain, fevers, chills, or sever fatigue call 911.  It was a pleasure meeting you, Mr. Martin Mcdowell.  I wish you and your family the best, and hope you stay happy and healthy!  Rocky Morel, Medical Student 08/24/2021

## 2021-08-25 ENCOUNTER — Other Ambulatory Visit: Payer: Self-pay | Admitting: Interventional Cardiology

## 2021-08-27 LAB — CULTURE, BLOOD (ROUTINE X 2)
Culture: NO GROWTH
Culture: NO GROWTH
Special Requests: ADEQUATE
Special Requests: ADEQUATE

## 2021-09-23 IMAGING — CR DG CHEST 2V
2 series · 2 of 2 positions shown · non-contrast
Comparison: 12/10/2018

CLINICAL DATA: Chest pain

EXAM:
CHEST - 2 VIEW

[chest pa]
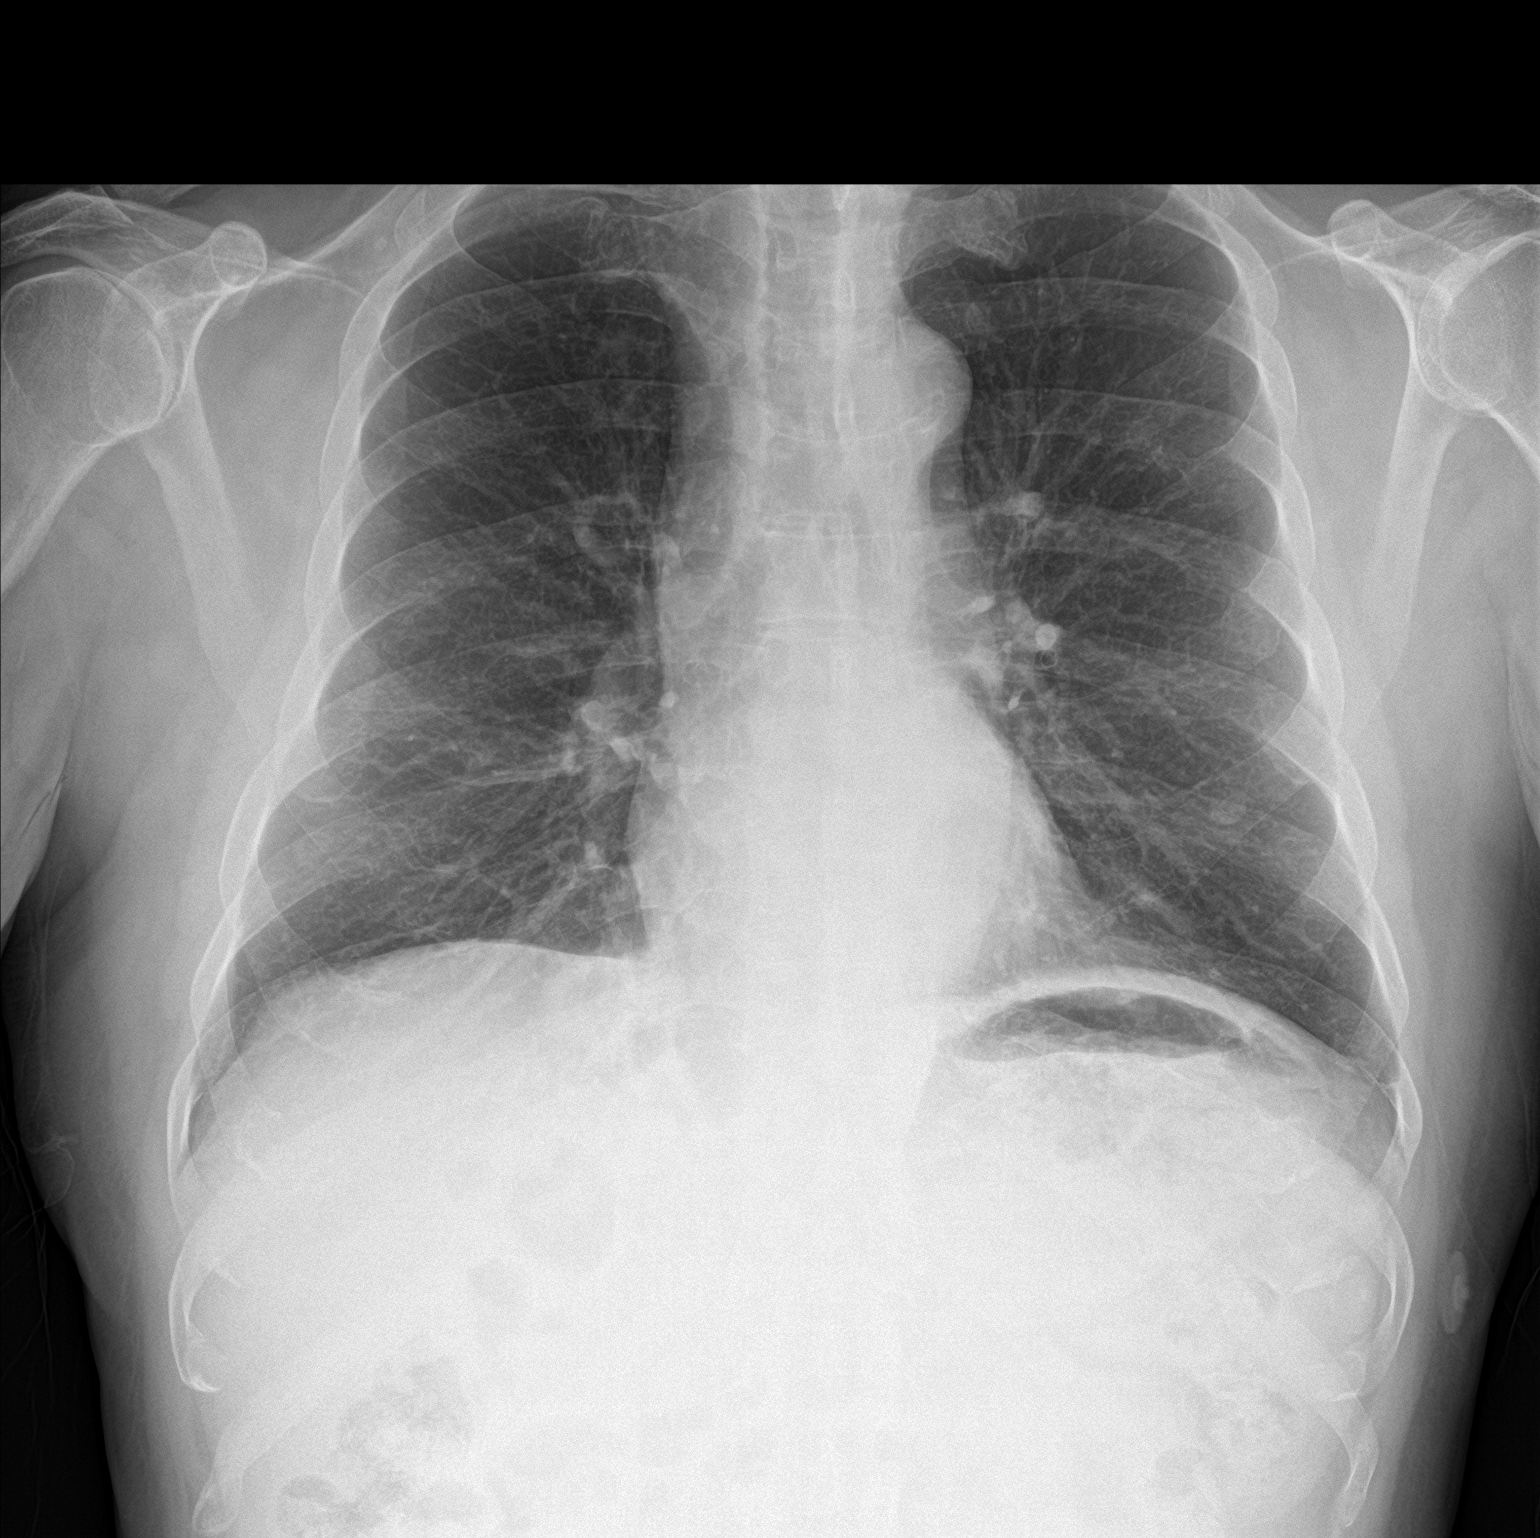

[chest lat]
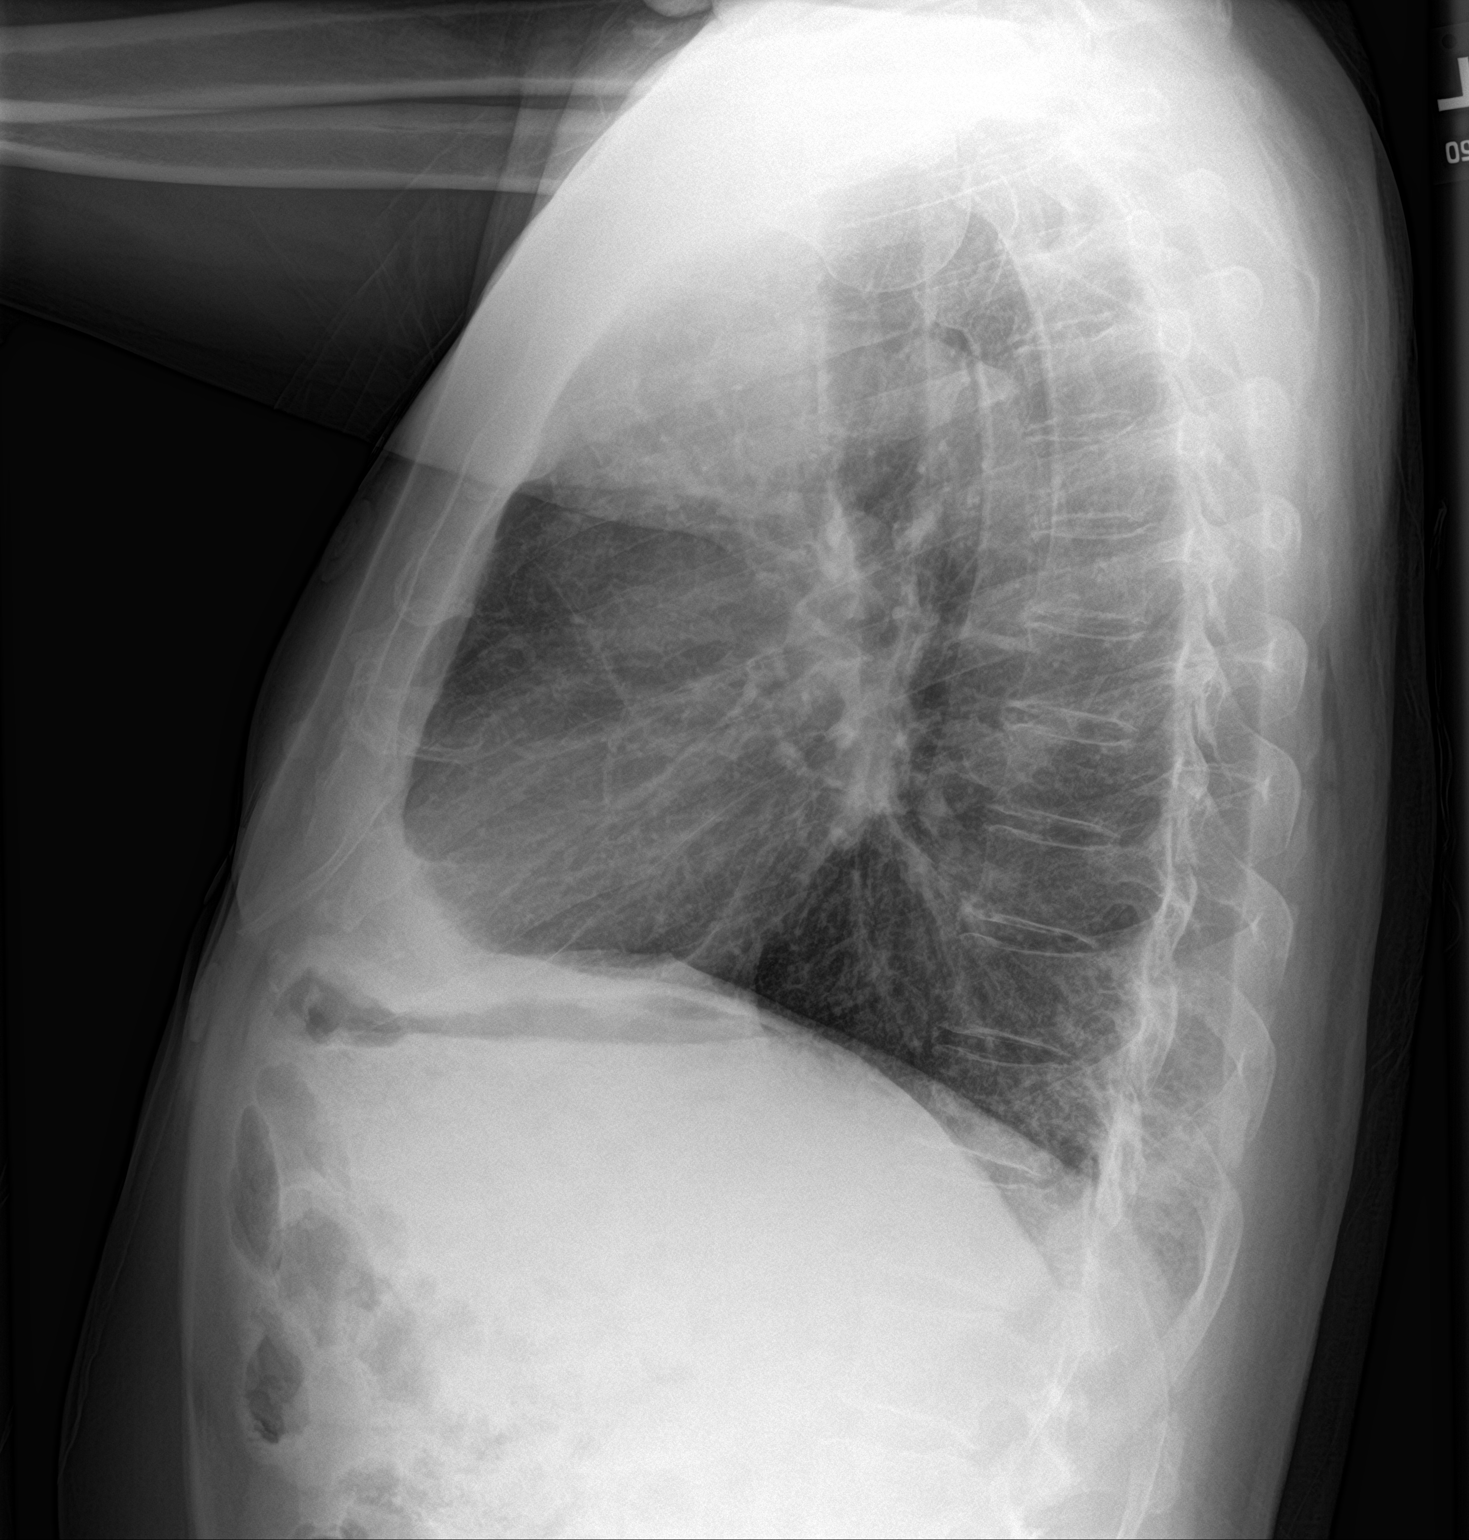

[2 of 2 positions shown; findings below may reference images not displayed]

FINDINGS: Lungs are well expanded and are symmetric. Nodular density at the
lung bases bilaterally likely represent nipple shadows. The lungs
are otherwise clear. No pneumothorax or pleural effusion. Cardiac
size within normal limits. Pulmonary vascularity normal. No acute
bone abnormality.
IMPRESSION: No active cardiopulmonary disease.

## 2021-09-24 ENCOUNTER — Emergency Department (HOSPITAL_COMMUNITY)
Admission: EM | Admit: 2021-09-24 | Discharge: 2021-09-24 | Disposition: A | Discharge status: Home / Self Care | Payer: Medicare Other | Attending: Emergency Medicine | Admitting: Emergency Medicine

## 2021-09-24 ENCOUNTER — Encounter (HOSPITAL_COMMUNITY): Payer: Self-pay | Admitting: Emergency Medicine

## 2021-09-24 ENCOUNTER — Emergency Department (HOSPITAL_COMMUNITY): Payer: Medicare Other

## 2021-09-24 DIAGNOSIS — S60512A Abrasion of left hand, initial encounter: Secondary | ICD-10-CM | POA: Diagnosis not present

## 2021-09-24 DIAGNOSIS — Z7902 Long term (current) use of antithrombotics/antiplatelets: Secondary | ICD-10-CM | POA: Diagnosis not present

## 2021-09-24 DIAGNOSIS — W1830XA Fall on same level, unspecified, initial encounter: Secondary | ICD-10-CM | POA: Insufficient documentation

## 2021-09-24 DIAGNOSIS — Z87891 Personal history of nicotine dependence: Secondary | ICD-10-CM | POA: Diagnosis not present

## 2021-09-24 DIAGNOSIS — I5042 Chronic combined systolic (congestive) and diastolic (congestive) heart failure: Secondary | ICD-10-CM | POA: Insufficient documentation

## 2021-09-24 DIAGNOSIS — Z23 Encounter for immunization: Secondary | ICD-10-CM | POA: Diagnosis not present

## 2021-09-24 DIAGNOSIS — M25561 Pain in right knee: Secondary | ICD-10-CM | POA: Diagnosis not present

## 2021-09-24 DIAGNOSIS — I2510 Atherosclerotic heart disease of native coronary artery without angina pectoris: Secondary | ICD-10-CM | POA: Insufficient documentation

## 2021-09-24 DIAGNOSIS — I110 Hypertensive heart disease with heart failure: Secondary | ICD-10-CM | POA: Insufficient documentation

## 2021-09-24 DIAGNOSIS — Z79899 Other long term (current) drug therapy: Secondary | ICD-10-CM | POA: Diagnosis not present

## 2021-09-24 DIAGNOSIS — S6992XA Unspecified injury of left wrist, hand and finger(s), initial encounter: Secondary | ICD-10-CM | POA: Diagnosis present

## 2021-09-24 MED ORDER — TETANUS-DIPHTH-ACELL PERTUSSIS 5-2.5-18.5 LF-MCG/0.5 IM SUSY
0.5000 mL | PREFILLED_SYRINGE | Freq: Once | INTRAMUSCULAR | Status: AC
  Administered 2021-09-24: 0.5 mL via INTRAMUSCULAR
  Filled 2021-09-24: qty 0.5

## 2021-09-24 MED ORDER — ACETAMINOPHEN 500 MG PO TABS
1000.0000 mg | ORAL_TABLET | Freq: Once | ORAL | Status: DC
  Filled 2021-09-24: qty 2

## 2021-09-24 NOTE — ED Triage Notes (Signed)
Patient BIB [facility] for evaluation of right knee pain after patient was taken to the ground by police, abrasion to right knee, skin tear to left knee. Patient in police custody, is alert, oriented, and in no apparent distress at this time.

## 2021-09-24 NOTE — ED Notes (Signed)
Pt verbalized understanding of d/c instructions, meds and followup care. Denies questions. VSS, no distress noted. Steady gait to exit with all belongings in police custody.

## 2021-09-24 NOTE — ED Notes (Signed)
Pt declined tylenol, states he does not have pain.

## 2021-09-24 NOTE — ED Provider Notes (Signed)
[facility] EMERGENCY DEPARTMENT Provider Note   CSN: [id number] Arrival date & time: 09/24/21  1136     History Chief Complaint  Patient presents with   Knee Pain    Martin Mcdowell is a 70 y.o. male.  The history is provided by the patient and medical records.  Knee Pain Location:  Knee Time since incident:  1 day Injury: yes   Mechanism of injury: fall   Fall:    Fall occurred: Tackled to the ground by police. Knee location:  R knee Pain details:    Quality:  Aching   Radiates to:  Does not radiate   Severity:  Moderate   Onset quality:  Sudden   Duration:  5 hours   Timing:  Constant   Progression:  Unchanged Dislocation: no   Tetanus status:  Out of date Prior injury to area:  No Relieved by:  Nothing Worsened by:  Bearing weight, flexion and extension Ineffective treatments:  None tried Associated symptoms: no back pain and no fever       Past Medical History:  Diagnosis Date   Anemia    CAD (coronary artery disease)    s/p stents   Chronic combined systolic (congestive) and diastolic (congestive) heart failure ([place]) [id number]   Chronic systolic CHF (congestive heart failure) (HCC)    DRESS syndrome    GERD (gastroesophageal reflux disease)    Hiatal hernia    Hypertension    Lesion of right lung    CT- multicystic right lower lobe lesion    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    Patient Active Problem List   Diagnosis Date Noted   Pneumonia 08/22/2021   RUQ abdominal pain    Choledocholithiasis with acute cholecystitis 12/14/2020   Essential hypertension 12/14/2020   Chronic combined systolic (congestive) and diastolic (congestive) heart failure ([place]) 12/14/2020   Acute biliary pancreatitis without infection or necrosis    Cholelithiasis with choledocholithiasis    Hyperlipidemia LDL goal <70 05/19/2020   CAD (coronary artery disease), native coronary artery 05/18/2020   STEMI (ST elevation myocardial  infarction) ([place]) 05/15/2020   Acute ST elevation myocardial infarction (STEMI) involving left anterior descending (LAD) coronary artery (HCC)    Acute esophagitis 03/06/2019   DRESS syndrome 12/23/2018   Anemia 12/23/2018   GERD (gastroesophageal reflux disease) 12/23/2018   Toxoplasmosis 12/23/2018   Epigastric pain 09/18/2018   Decreased libido 09/18/2018   Fatigue 08/07/2018   Cough 08/07/2018   Screening for colon cancer 08/07/2018    Past Surgical History:  Procedure Laterality Date   CHOLECYSTECTOMY N/A 12/17/2020   Procedure: LAPAROSCOPIC CHOLECYSTECTOMY WITH INTRAOPERATIVE CHOLANGIOGRAM;  Surgeon: Kieth Brightly Arta Bruce, MD;  Location: [place];  Service: General;  Laterality: N/A;  ROOM 1 STARTING AT 09:00AM FOR 90 MIN   CORONARY STENT INTERVENTION N/A 05/18/2020   Procedure: CORONARY STENT INTERVENTION;  Surgeon: Belva Crome, MD;  Location: [place] [facility];  Service: Cardiovascular;  Laterality: N/A;   CORONARY/GRAFT ACUTE MI REVASCULARIZATION N/A 05/15/2020   Procedure: CORONARY/GRAFT ACUTE MI REVASCULARIZATION;  Surgeon: Belva Crome, MD;  Location: [place] [facility];  Service: Cardiovascular;  Laterality: N/A;       Family History  Problem Relation Age of Onset   Colon cancer Neg Hx    Stomach cancer Neg Hx    Pancreatic cancer Neg Hx    Esophageal cancer Neg Hx    Rectal cancer Neg Hx     Social History  Tobacco Use   Smoking status: Former    Packs/day: 2.00    Years: 22.00    Pack years: 44.00    Types: Cigarettes    Quit date: 1990    Years since quitting: 32.9   Smokeless tobacco: Never  Vaping Use   Vaping Use: Never used  Substance Use Topics   Alcohol use: Yes    Alcohol/week: 2.0 - 3.0 standard drinks    Types: 2 - 3 Standard drinks or equivalent per week    Comment: Occ    Drug use: No    Home Medications Prior to Admission medications   Medication Sig Start Date End Date Taking? Authorizing Provider  albuterol (VENTOLIN HFA) 108  (90 Base) MCG/ACT inhaler Inhale 2 puffs into the lungs every 6 (six) hours as needed for wheezing or shortness of breath. 08/24/21   Lacinda Axon, MD  atorvastatin (LIPITOR) 80 MG tablet TAKE 1 TABLET BY MOUTH EVERY DAY 06/18/21   Almyra Deforest, PA  clopidogrel (PLAVIX) 75 MG tablet TAKE 1 TABLET BY MOUTH EVERY DAY 08/25/21   Belva Crome, MD  metoprolol succinate (TOPROL-XL) 25 MG 24 hr tablet Take 1 tablet (25 mg total) by mouth daily. Patient taking differently: Take 25 mg by mouth in the morning and at bedtime. 01/22/21   Belva Crome, MD  niacin 500 MG tablet Take 500 mg by mouth daily.    [provider]  nitroGLYCERIN (NITROSTAT) 0.4 MG SL tablet Place 1 tablet (0.4 mg total) under the tongue every 5 (five) minutes x 3 doses as needed for chest pain. 07/29/21   Belva Crome, MD  pantoprazole (PROTONIX) 40 MG tablet Take 40 mg by mouth daily.    [provider]  sildenafil (VIAGRA) 50 MG tablet Take 50 mg by mouth daily as needed. 08/06/21   [provider]    Allergies    Sulfamethoxazole-trimethoprim  Review of Systems   Review of Systems  Constitutional:  Negative for chills and fever.  HENT:  Negative for ear pain and sore throat.   Eyes:  Negative for pain and visual disturbance.  Respiratory:  Negative for cough and shortness of breath.   Cardiovascular:  Negative for chest pain and palpitations.  Gastrointestinal:  Negative for abdominal pain and vomiting.  Genitourinary:  Negative for dysuria and hematuria.  Musculoskeletal:  Negative for arthralgias and back pain.       R knee pain, L palm abrasion  Skin:  Negative for color change and rash.  Neurological:  Negative for seizures and syncope.  All other systems reviewed and are negative.  Physical Exam Updated Vital Signs BP 128/83 (BP Location: Left Arm)   Pulse 74   Temp 98 F (36.7 C) (Oral)   Resp 16   SpO2 100%   Physical Exam Vitals and nursing note reviewed.  Constitutional:       General: He is not in acute distress.    Appearance: Normal appearance. He is well-developed.  HENT:     Head: Normocephalic and atraumatic.     Right Ear: External ear normal.     Left Ear: External ear normal.     Nose: Nose normal. No congestion.     Mouth/Throat:     Mouth: Mucous membranes are moist.     Pharynx: Oropharynx is clear. No posterior oropharyngeal erythema.  Eyes:     Extraocular Movements: Extraocular movements intact.     Conjunctiva/sclera: Conjunctivae normal.     Pupils: Pupils  are equal, round, and reactive to light.  Cardiovascular:     Rate and Rhythm: Normal rate and regular rhythm.     Pulses: Normal pulses.     Heart sounds: No murmur heard. Pulmonary:     Effort: Pulmonary effort is normal. No respiratory distress.     Breath sounds: Normal breath sounds. No wheezing, rhonchi or rales.  Abdominal:     General: Abdomen is flat. Bowel sounds are normal.     Palpations: Abdomen is soft.     Tenderness: There is no abdominal tenderness. There is no guarding or rebound.  Musculoskeletal:        General: Tenderness (Mild tenderness to palpation of the anterior right knee overlying the patella.  No overlying wounds or abrasions.  Full range of motion of the right knee.  He is neurovascularly intact distally.  No obvious joint laxity.) present. No swelling or deformity. Normal range of motion.     Cervical back: Normal range of motion and neck supple. No rigidity.     Comments: Small superficial abrasion to the left palm.  Hemostatic.  No obvious foreign body.  Skin:    General: Skin is warm and dry.     Capillary Refill: Capillary refill takes less than 2 seconds.     Findings: No rash.  Neurological:     General: No focal deficit present.     Mental Status: He is alert and oriented to person, place, and time.  Psychiatric:        Mood and Affect: Mood normal.    ED Results / Procedures / Treatments   Labs (all labs ordered are listed, but only  abnormal results are displayed) Labs Reviewed - No data to display  EKG None  Radiology DG Knee Complete 4 Views Right  Result Date: 09/24/2021 CLINICAL DATA:  Right knee pain status post fall. EXAM: RIGHT KNEE - COMPLETE 4+ VIEW COMPARISON:  None. FINDINGS: No evidence of fracture, dislocation, or joint effusion. Minimal osteoarthritic changes of the medial and patellofemoral compartments. No focal bone abnormality. Vascular calcifications noted in the distal right thigh. IMPRESSION: No acute osseous abnormality of the right knee. Electronically Signed   By: Sherron Ales M.D.   On: 09/24/2021 12:27    Procedures Procedures   Medications Ordered in ED Medications  acetaminophen (TYLENOL) tablet 1,000 mg (has no administration in time range)  Tdap (BOOSTRIX) injection 0.5 mL (has no administration in time range)    ED Course  I have reviewed the triage vital signs and the nursing notes.  Pertinent labs & imaging results that were available during my care of the patient were reviewed by me and considered in my medical decision making (see chart for details).    MDM Rules/Calculators/A&P                          70 year old male with right knee pain as above.  X-ray showed no acute fracture or effusion.  No traumatic malalignment.  He has no overlying wounds or lacerations.  He is neurovascularly intact.  Likely patellar contusion.  Encouraged RICE therapy and close follow-up with PCP in a week for reassessment.  Patient does also have a small abrasion to his left palm.  The wound is hemostatic.  No lacerations.  No indications for repair.  Wound irrigated at bedside.  Placed in gauze.  Tdap updated.  Appropriate for discharge.  Strict return precautions provided.  Final Clinical Impression(s) / ED  Diagnoses Final diagnoses:  Acute pain of right knee  Abrasion of palm of hand, left, initial encounter    Rx / DC Orders ED Discharge Orders     None        Idamae Lusher,  MD 09/24/21 Hutchinson, Ankit, MD 09/24/21 2125

## 2021-09-24 NOTE — ED Provider Notes (Signed)
Emergency Medicine Provider Triage Evaluation Note  Martin Mcdowell , a 70 y.o. male  was evaluated in triage.  Pt complains of right knee and left hand pain.  [place] he was taken to the ground by police.  He states that he fell onto his right knee and left hand.  He denies hitting his head or loss of consciousness.  He is anticoagulated on Plavix. Review of Systems  Positive: See above Negative:   Physical Exam  BP 131/77 (BP Location: Right Arm)   Pulse 86   Temp 98.3 F (36.8 C)   Resp 16   SpO2 100%  Gen:   Awake, no distress   Resp:  Normal effort  MSK:   Moves extremities without difficulty  Other:  Right knee with abrasion to the medial aspect.  No hemarthrosis or effusion present.  No obvious deformity.  Strength 5/5 in bilateral lower extremities.  Sensation intact.  The left hand has a 1 to 2 cm abrasion without laceration or hematoma.  Hemostatic.  Medical Decision Making  Medically screening exam initiated at 12:00 PM.  Appropriate orders placed.  Martin Mcdowell was informed that the remainder of the evaluation will be completed by another provider, this initial triage assessment does not replace that evaluation, and the importance of remaining in the ED until their evaluation is complete.    Cristopher Peru, PA-C 09/24/21 1202    Horton, Clabe Seal, DO 09/24/21 [phone]

## 2021-10-11 ENCOUNTER — Other Ambulatory Visit: Payer: Self-pay | Admitting: Family Medicine

## 2021-10-11 ENCOUNTER — Ambulatory Visit
Admission: RE | Admit: 2021-10-11 | Discharge: 2021-10-11 | Disposition: A | Discharge status: Home / Self Care | Payer: Medicare Other | Source: Ambulatory Visit | Attending: Family Medicine | Admitting: Family Medicine

## 2021-10-11 ENCOUNTER — Other Ambulatory Visit: Payer: Self-pay

## 2021-10-11 DIAGNOSIS — Z09 Encounter for follow-up examination after completed treatment for conditions other than malignant neoplasm: Secondary | ICD-10-CM

## 2021-12-01 ENCOUNTER — Encounter: Payer: Self-pay | Admitting: Interventional Cardiology

## 2022-01-02 ENCOUNTER — Other Ambulatory Visit: Payer: Self-pay | Admitting: Interventional Cardiology

## 2022-02-09 ENCOUNTER — Other Ambulatory Visit: Payer: Self-pay | Admitting: Family Medicine

## 2022-02-09 ENCOUNTER — Ambulatory Visit
Admission: RE | Admit: 2022-02-09 | Discharge: 2022-02-09 | Disposition: A | Discharge status: Home / Self Care | Payer: Medicare Other | Source: Ambulatory Visit | Attending: Family Medicine | Admitting: Family Medicine

## 2022-02-09 DIAGNOSIS — J449 Chronic obstructive pulmonary disease, unspecified: Secondary | ICD-10-CM

## 2022-02-09 DIAGNOSIS — I509 Heart failure, unspecified: Secondary | ICD-10-CM

## 2022-02-25 ENCOUNTER — Other Ambulatory Visit: Payer: Self-pay | Admitting: Interventional Cardiology

## 2022-04-15 ENCOUNTER — Encounter: Payer: Self-pay | Admitting: Interventional Cardiology

## 2022-04-18 MED ORDER — SACUBITRIL-VALSARTAN 49-51 MG PO TABS
1.0000 | ORAL_TABLET | Freq: Two times a day (BID) | ORAL | 11 refills | Status: DC
Start: 2022-04-18 — End: 2023-05-01

## 2022-04-26 IMAGING — US US ABDOMEN LIMITED RUQ/ASCITES
1 series · 14 of 25 positions shown · non-contrast
Comparison: 03/15/2019.

CLINICAL DATA: Right upper quadrant pain, jaundice.

EXAM:
ULTRASOUND ABDOMEN LIMITED RIGHT UPPER QUADRANT

[Series 1: us abdomen limited ruq (liver/gb) · 14 of 81 slices shown]
[im 1/81]
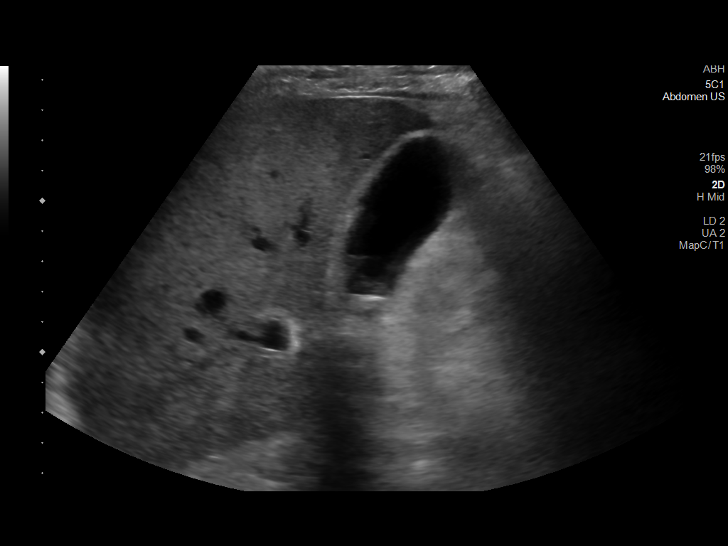
[im 7/81]
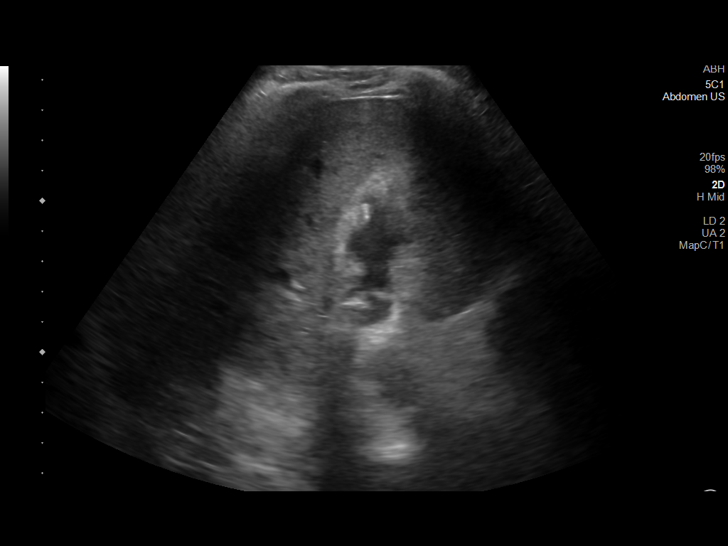
[im 14/81]
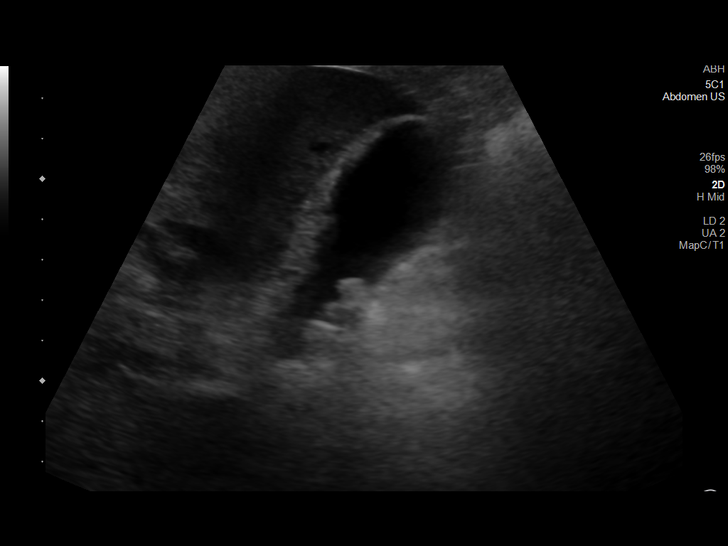
[im 21/81]
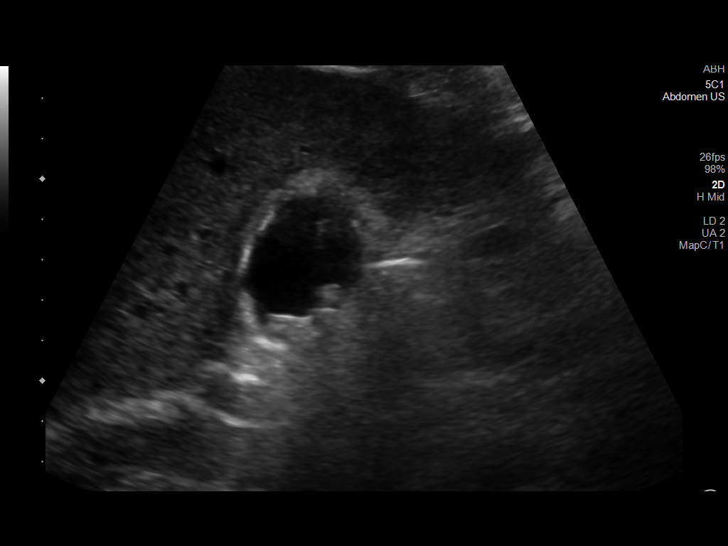
[im 27/81]
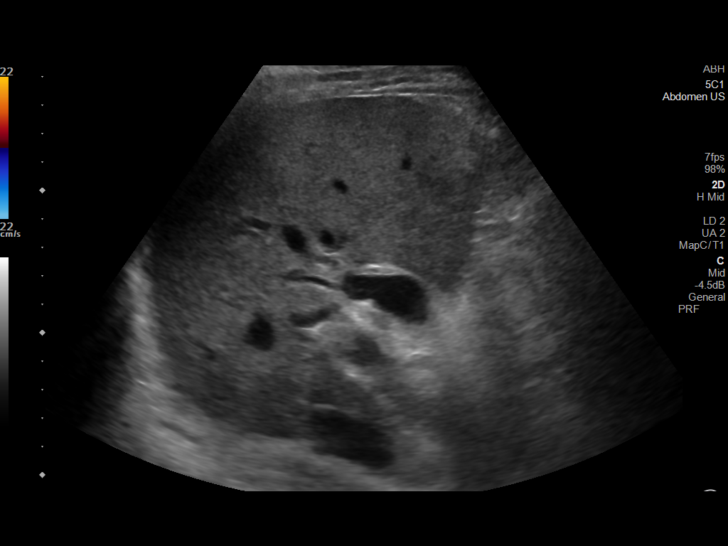
[im 31/81]
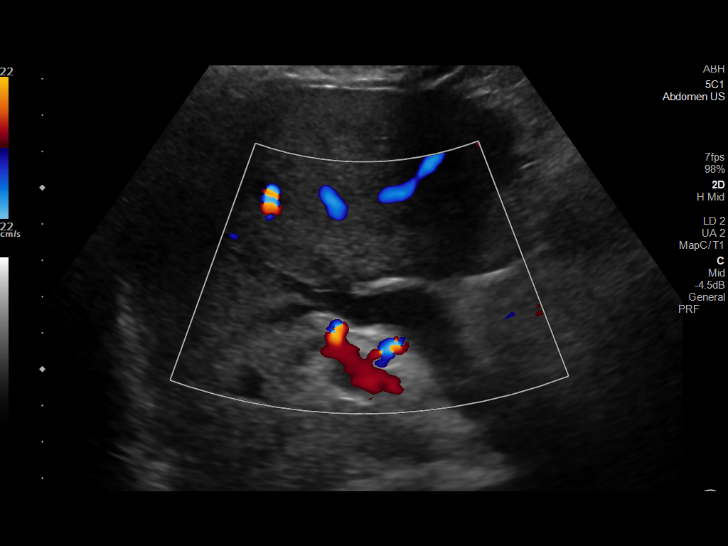
[im 37/81]
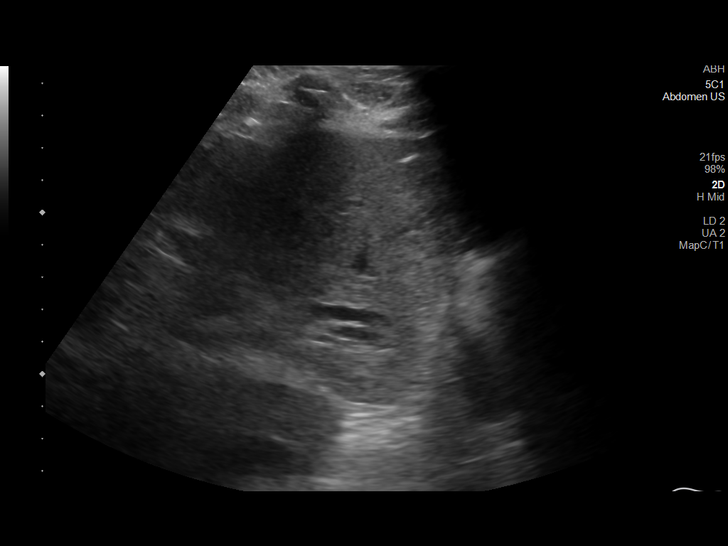
[im 44/81]
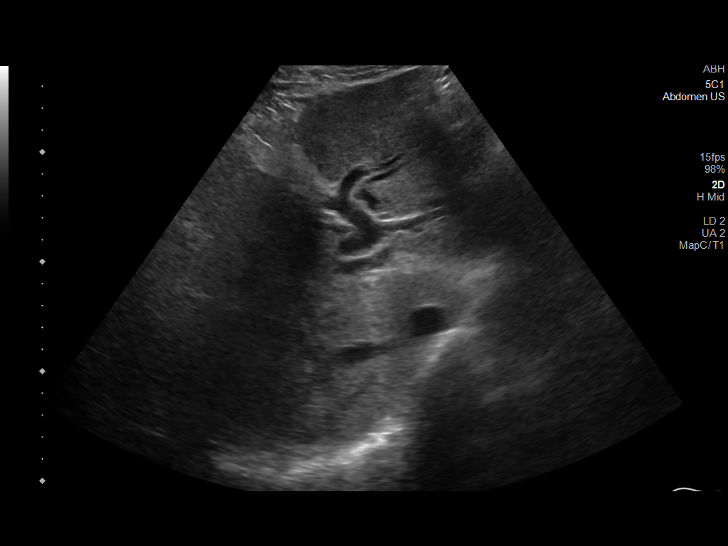
[im 51/81]
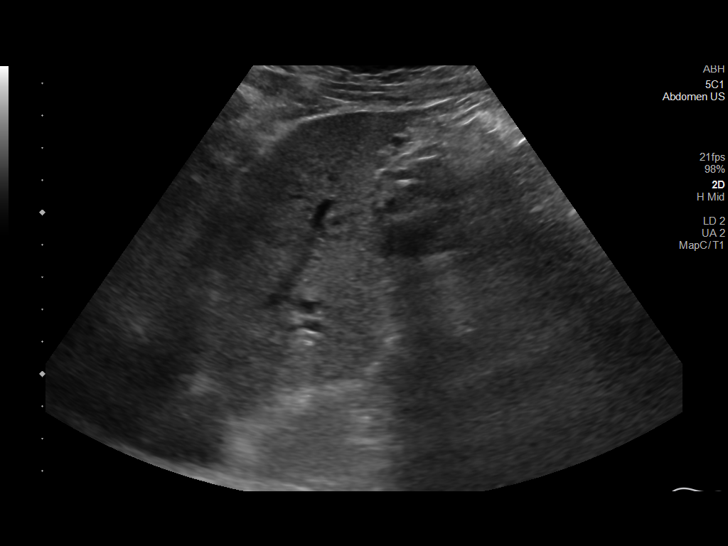
[im 54/81]
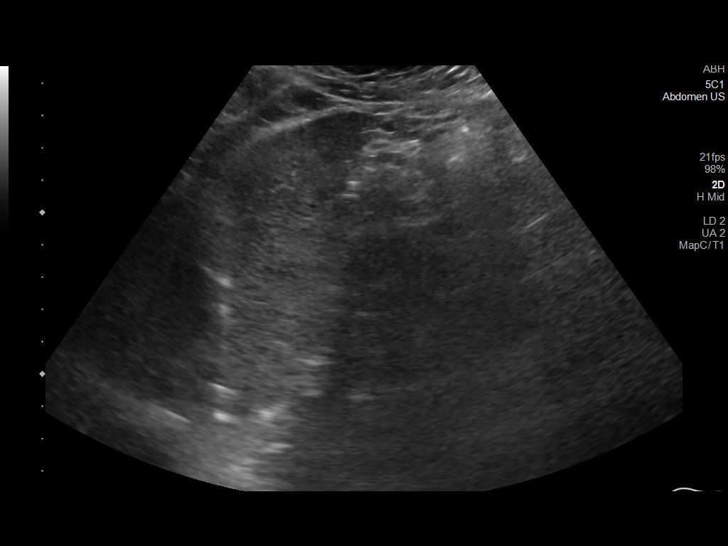
[im 61/81]
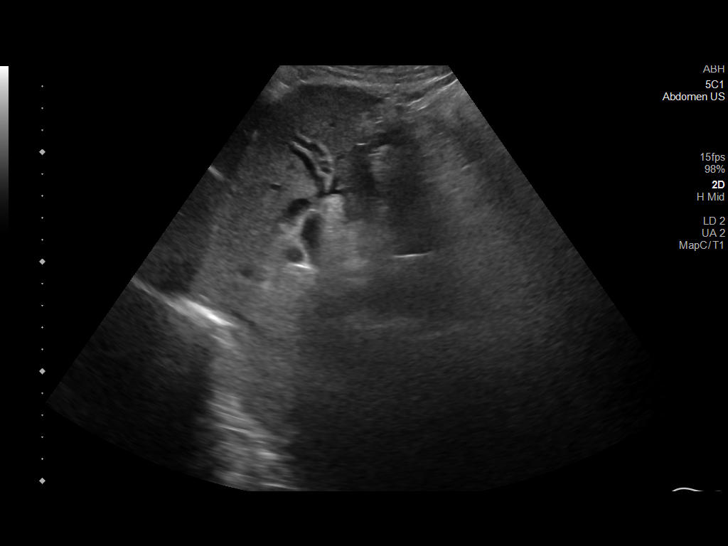
[im 67/81]
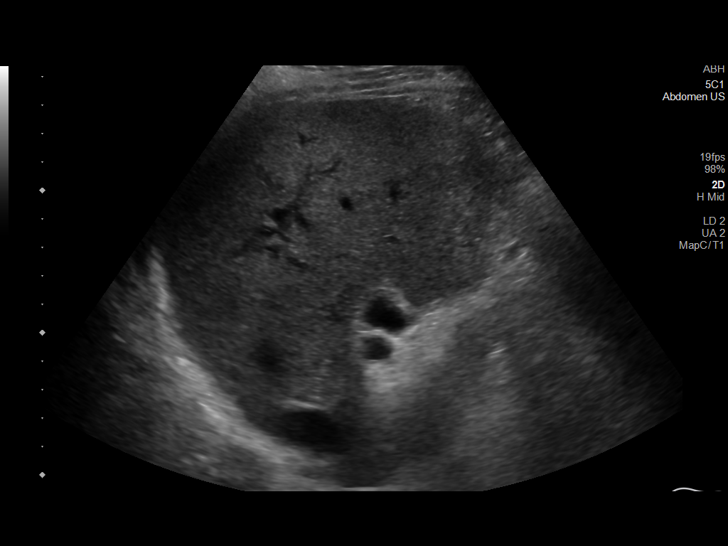
[im 74/81]
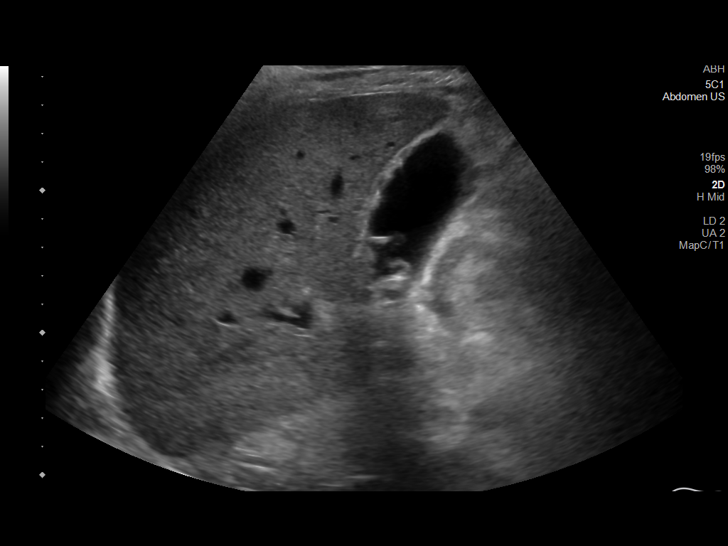
[im 81/81]
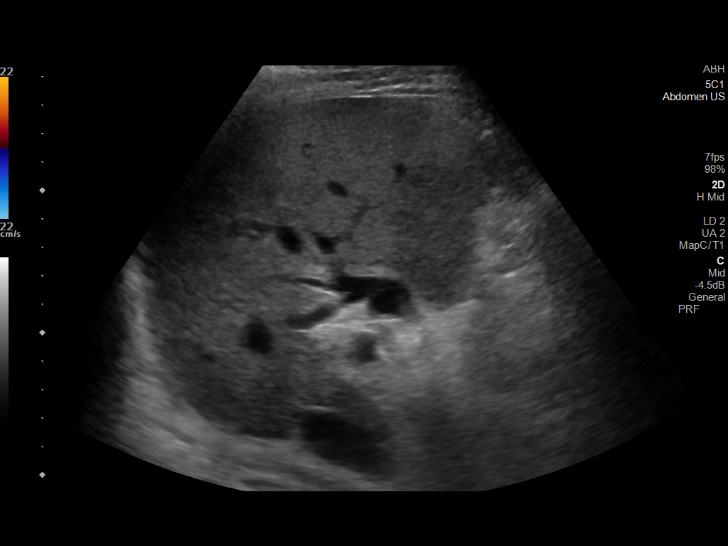

[14 of 25 positions shown; findings below may reference images not displayed]

FINDINGS: Gallbladder:

Prominence of the gallbladder wall measuring up to 3.7 mm.
Intraluminal calculi measuring up to 1.2 cm with sludge. Sonographic
Murphy sign could not be assessed secondary to premedication.

Common bile duct:

Diameter: 15.4 mm proximally. Overlying bowel gas limits
visualization of the distal CBD.

Liver:

No focal lesion identified. Within normal limits in parenchymal
echogenicity. Mild intrahepatic biliary dilatation. Portal vein is
patent on color Doppler imaging with normal direction of blood flow
towards the liver.

Other: None.
IMPRESSION: Cholelithiasis and intrahepatic/extrahepatic biliary dilatation is
suspicious for choledocholithiasis.

Cholecystitis is lower on the differential given lack of wall
edema/pericholecystic free fluid. Sonographic Murphy sign could not
be assessed secondary to premedication.

## 2022-04-26 IMAGING — CT CT ABD-PELV W/ CM
2 of 5 series · 16 of 46 positions shown, 18 images · IV contrast (Omni 300)
Comparison: Ultrasound of earlier today.  CTA chest 03/15/2019.

CLINICAL DATA: Epigastric pain for 2 weeks. Concern for
choledocholithiasis on ultrasound.

EXAM:
CT ABDOMEN AND PELVIS WITH CONTRAST
TECHNIQUE: Multidetector CT imaging of the abdomen and pelvis was performed
using the standard protocol following bolus administration of
intravenous contrast.
CONTRAST:  100mL OMNIPAQUE IOHEXOL 300 MG/ML  SOLN

[Series 3: a/p w/ 5mm · axial · 0.83mm/px · z∈[+768,+1218]mm · 13 of 100 slices shown, 15 images]
[im 5/100  soft-tissue]
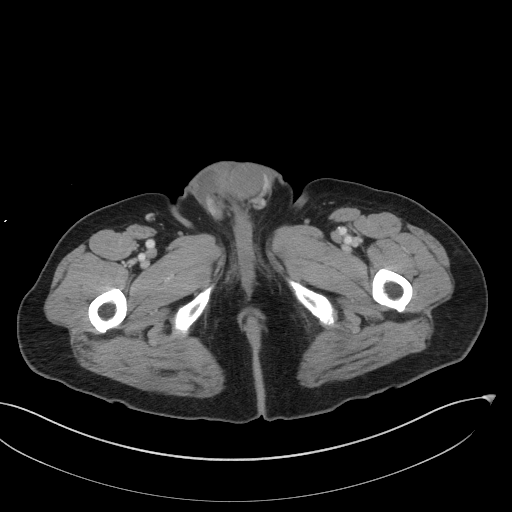
[im 5/100  bone]
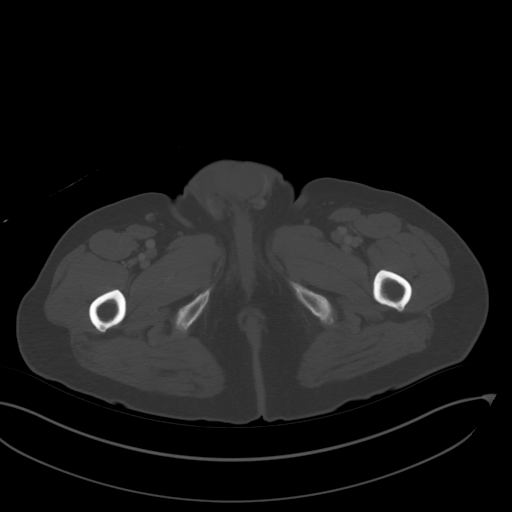
[im 15/100  soft-tissue]
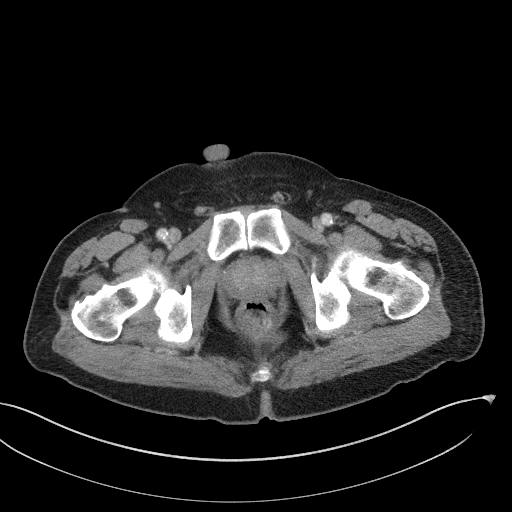
[im 20/100  soft-tissue]
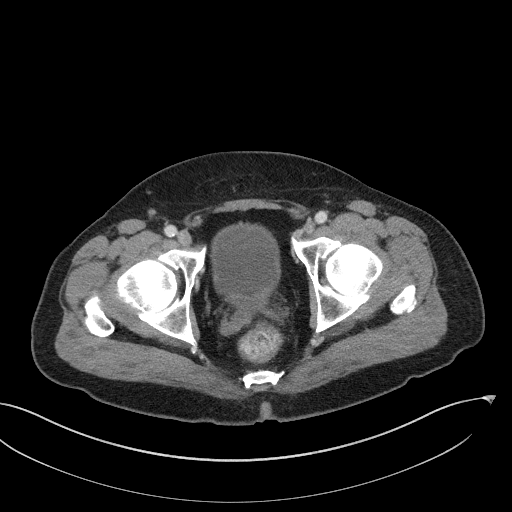
[im 30/100  soft-tissue]
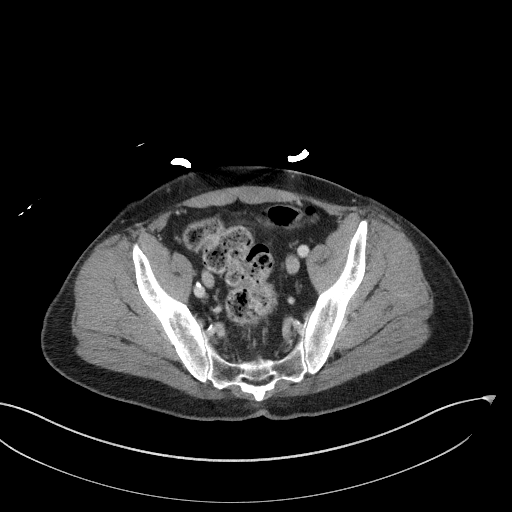
[im 35/100  soft-tissue]
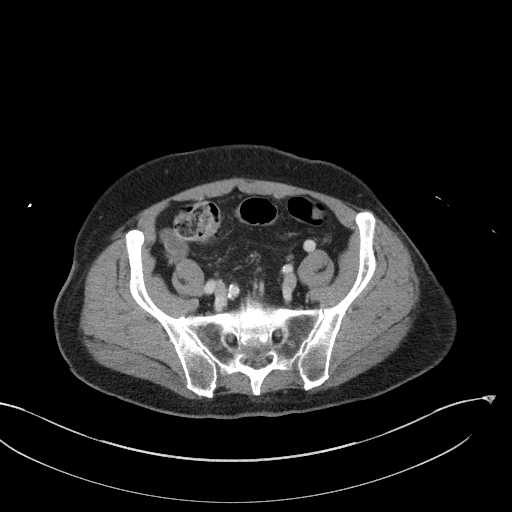
[im 45/100  soft-tissue]
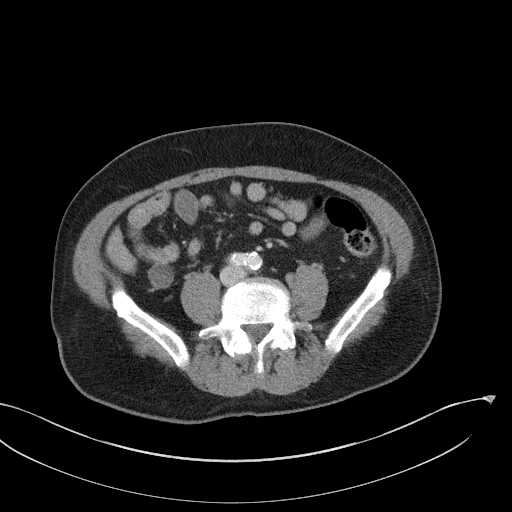
[im 50/100  soft-tissue]
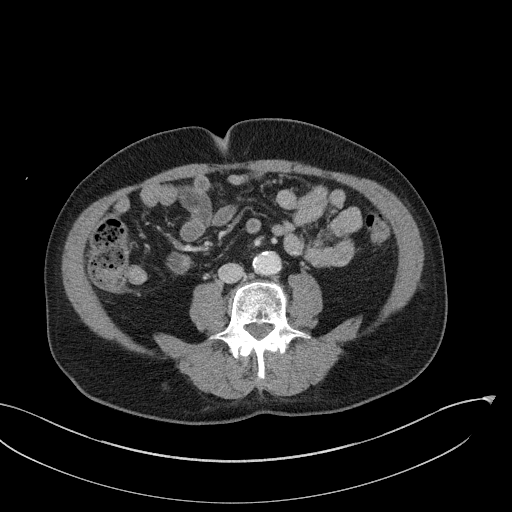
[im 55/100  soft-tissue]
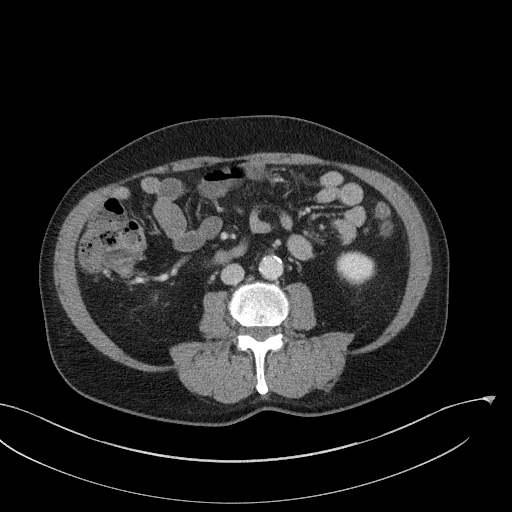
[im 65/100  soft-tissue]
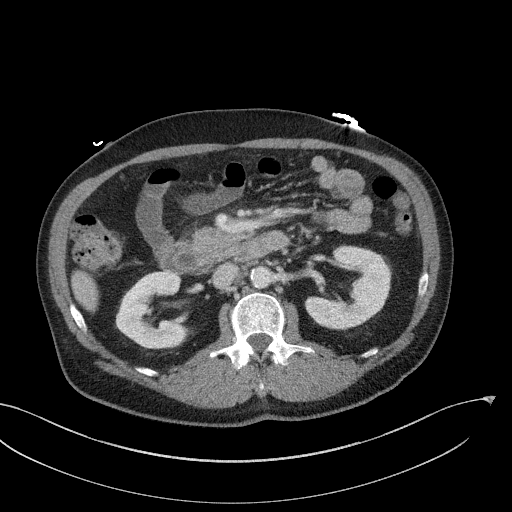
[im 65/100  bone]
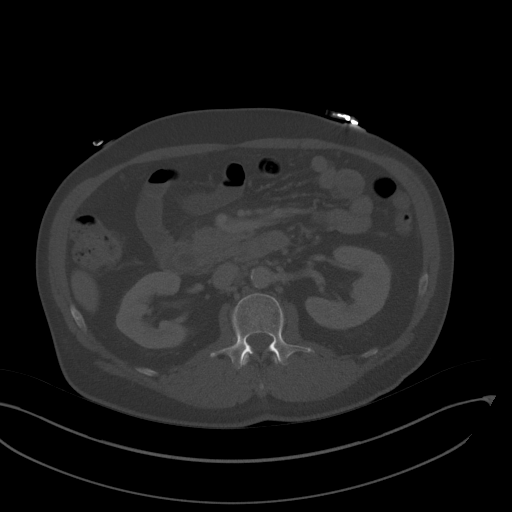
[im 70/100  soft-tissue]
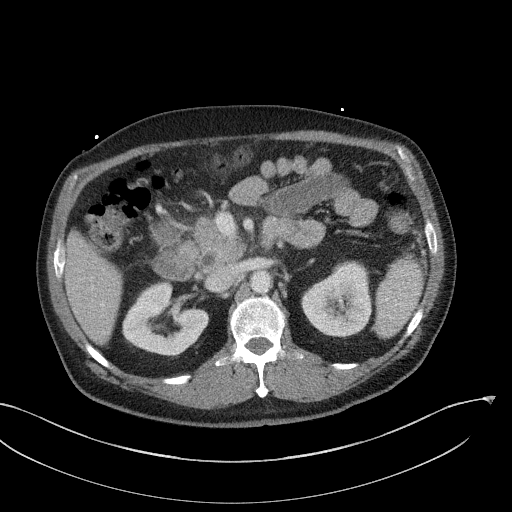
[im 80/100  soft-tissue]
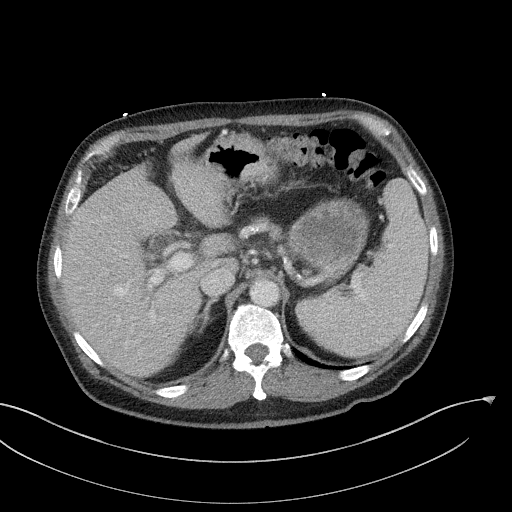
[im 85/100  soft-tissue]
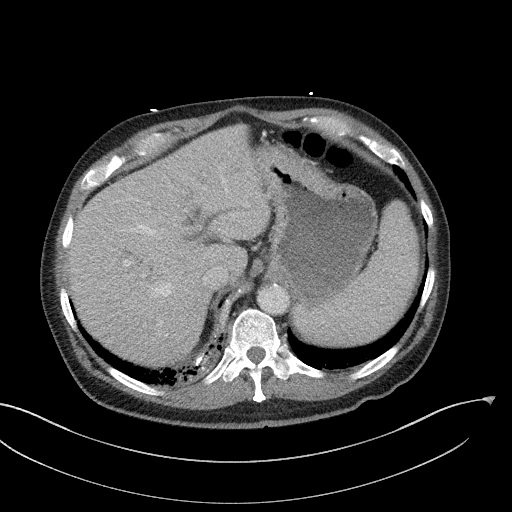
[im 95/100  soft-tissue]
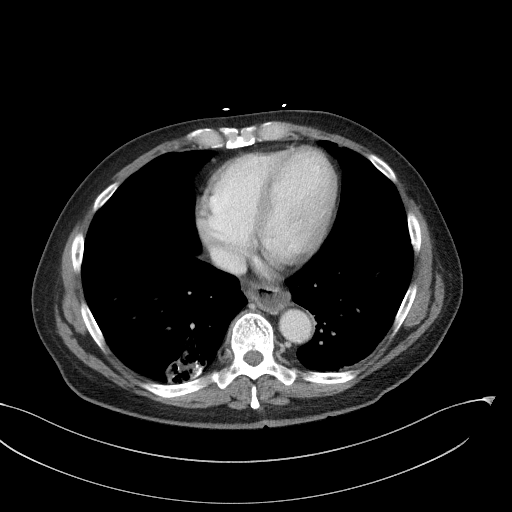

[Series 7: a/p w/ cor · coronal · 0.89mm/px · 3 of 155 slices shown]
[im 52/155  soft-tissue]
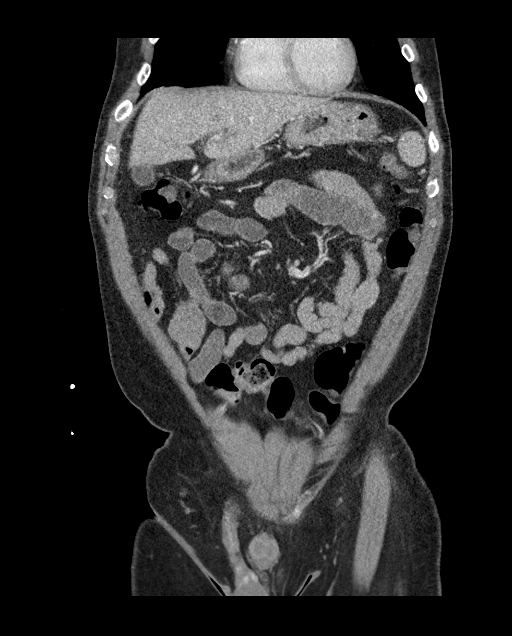
[im 69/155  soft-tissue]
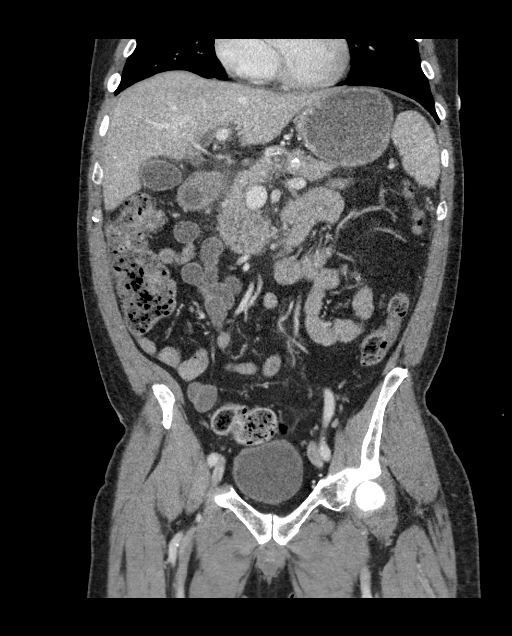
[im 86/155  soft-tissue]
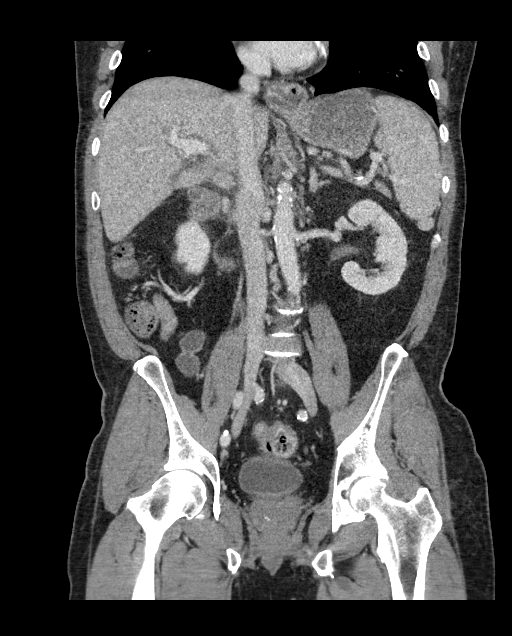

[16 of 46 positions shown; findings below may reference images not displayed]

FINDINGS: Lower chest: Similar appearance of right lower lobe consolidation
with bronchiectasis including on [DATE]. Arterial supply off the
upper abdominal aorta is more apparent on the prior CTA. Multivessel
coronary artery atherosclerosis. Small hiatal hernia with fluid in
the herniated stomach including on [DATE]. Distal esophageal fluid as
well.

Hepatobiliary: Normal liver.  No calcified gallstones.

Mild intrahepatic biliary duct dilatation. Moderate common duct
dilatation, including at 1.5 cm on 81/7. This is followed to the
level of the ampulla. Subtle hyperattenuation in this area including
at 6 mm on 39/3.

Pancreas: No pancreatic duct dilatation. Suspect mild peripancreatic
edema, including adjacent the tail on [DATE] and the head on [DATE].

Spleen: Normal in size, without focal abnormality.

Adrenals/Urinary Tract: Normal adrenal glands. Normal kidneys,
without hydronephrosis. Normal urinary bladder.

Stomach/Bowel: Gastric body underdistention. Normal colon and
terminal ileum. Normal small bowel.

Vascular/Lymphatic: Aortic atherosclerosis. No abdominopelvic
adenopathy.

Reproductive: Normal prostate. Trace right scrotal fluid, likely
physiologic.

Other: No significant free fluid.

Musculoskeletal: No acute osseous abnormality.
IMPRESSION: 1. Intra and extrahepatic biliary duct dilatation with subtle
hyperattenuation at the level of the ampulla. Equivocal for 6 mm
stone versus ampullary edema. Consider further evaluation with ERCP.
If preprocedure differentiation is desired, MRCP may be informative.
2. Suspicion of mild pancreatitis, lipase pending.
3. Similar right lower lobe process compared to 03/15/2019, most
consistent with pulmonary sequestration, favor intralobar type.
4. Small hiatal hernia with fluid in the herniated stomach and
distal esophagus, suggesting dysmotility and/or gastroesophageal
reflux.
5.  Aortic Atherosclerosis ([id number]).

## 2022-07-16 NOTE — Progress Notes (Unsigned)
Office Visit    Patient Name: Martin Mcdowell Date of Encounter: 07/18/2022  PCP:  Roselee Nova, MD   [place]  Cardiologist:  Sinclair Grooms, MD  Advanced Practice Provider:  No care team member to display Electrophysiologist:  None   Chief Complaint    Martin Mcdowell is a 71 y.o. male with past medical history significant for anterior MI 12/5327, acute systolic heart failure (EF 35 to 40%) this was unchanged after GDMT 07/2020, hypertension, DRESS syndrome, hyperlipidemia, history of atypical chest pain presents today for follow-up.  Patient has a history of not wanting to take any medications.  He never had used nitroglycerin.  Usually has pretty low systolic blood pressure around 100 and tolerates it well.  Aspirin was continued but Plavix was discontinued.  Today, he feels pretty good.  He does some walking when he can.  He has not had any issues with new shortness of breath.  His left side does swell more than his right due to some lymphedema but this is stable.  He appears euvolemic on exam.  We discussed repeating an echocardiogram to review his ejection fraction after being on GDMT.  He is currently just on Plavix and stopped his aspirin.  Otherwise, doing well from a CV standpoint.  Reports no shortness of breath nor dyspnea on exertion. Reports no chest pain, pressure, or tightness. No new edema, orthopnea, PND. Reports no palpitations.  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   Past Surgical  History:  Procedure Laterality Date   CHOLECYSTECTOMY N/A 12/17/2020   Procedure: LAPAROSCOPIC CHOLECYSTECTOMY WITH INTRAOPERATIVE CHOLANGIOGRAM;  Surgeon: Kinsinger, Arta Bruce, MD;  Location: [place];  Service: General;  Laterality: N/A;  ROOM 1 STARTING AT 09:00AM FOR 90 MIN   CORONARY STENT INTERVENTION N/A 05/18/2020   Procedure: CORONARY STENT INTERVENTION;  Surgeon: Belva Crome, MD;  Location: [place] [facility];  Service: Cardiovascular;  Laterality: N/A;   CORONARY/GRAFT ACUTE MI REVASCULARIZATION N/A 05/15/2020   Procedure: CORONARY/GRAFT ACUTE MI REVASCULARIZATION;  Surgeon: Belva Crome, MD;  Location: [place] [facility];  Service: Cardiovascular;  Laterality: N/A;    Allergies  Allergies  Allergen Reactions   Sulfamethoxazole-Trimethoprim Anaphylaxis and Rash    DRESS syndrome requiring hospitalization; 12/11/2018    EKGs/Labs/Other Studies Reviewed:   The following studies were reviewed today:  ECHO 02/2021: IMPRESSIONS     1. Left ventricular ejection fraction, by estimation, is 40 to 45%. The  left ventricle has mildly decreased function. The left ventricle  demonstrates regional wall motion abnormalities (see scoring  diagram/findings for description). Left ventricular  diastolic parameters are indeterminate.   2. Right ventricular systolic function is normal. The right ventricular  size is normal. Tricuspid regurgitation signal is inadequate for assessing  PA pressure.   3. The mitral valve is normal in structure. Trivial mitral valve  regurgitation. No evidence of mitral stenosis.   4. The aortic valve is  tricuspid. Aortic valve regurgitation is not  visualized. Mild aortic valve sclerosis is present, with no evidence of  aortic valve stenosis.   5. The inferior vena cava is normal in size with greater than 50%  respiratory variability, suggesting right atrial pressure of 3 mmHg.   EKG:  EKG is  ordered today.  The ekg ordered today demonstrates normal sinus  rhythm, rate 63 bpm  Recent Labs: 08/22/2021: ALT 12 08/24/2021: BUN 16; Creatinine, Ser 0.95; Hemoglobin 9.8; Platelets 152; Potassium 4.0; Sodium 133  Recent Lipid Panel    Component Value Date/Time   CHOL 139 06/25/2020 0945   TRIG 102 06/25/2020 0945   HDL 45 06/25/2020 0945   CHOLHDL 3.1 06/25/2020 0945   CHOLHDL 5.4 05/15/2020 1109   VLDL 29 05/15/2020 1109   LDLCALC 75 06/25/2020 0945   Home Medications   Current Meds  Medication Sig   albuterol (VENTOLIN HFA) 108 (90 Base) MCG/ACT inhaler Inhale 2 puffs into the lungs every 6 (six) hours as needed for wheezing or shortness of breath.   atorvastatin (LIPITOR) 80 MG tablet TAKE 1 TABLET BY MOUTH EVERY DAY   metoprolol succinate (TOPROL-XL) 25 MG 24 hr tablet TAKE 1 TABLET (25 MG TOTAL) BY MOUTH DAILY.   niacin 500 MG tablet Take 500 mg by mouth daily.   nitroGLYCERIN (NITROSTAT) 0.4 MG SL tablet Place 1 tablet (0.4 mg total) under the tongue every 5 (five) minutes x 3 doses as needed for chest pain.   pantoprazole (PROTONIX) 40 MG tablet Take 40 mg by mouth 2 (two) times daily.   sacubitril-valsartan (ENTRESTO) 49-51 MG Take 1 tablet by mouth 2 (two) times daily.   sildenafil (VIAGRA) 50 MG tablet Take 50 mg by mouth daily as needed.   [DISCONTINUED] clopidogrel (PLAVIX) 75 MG tablet TAKE 1 TABLET BY MOUTH EVERY DAY     Review of Systems      All other systems reviewed and are otherwise negative except as noted above.  Physical Exam    VS:  BP 116/70   Pulse 63   Ht 5\' 7"  (1.702 m)   Wt 180 lb 3.2 oz (81.7 kg)   SpO2 98%   BMI 28.22 kg/m  , BMI Body mass index is 28.22 kg/m.  Wt Readings from Last 3 Encounters:  07/18/22 180 lb 3.2 oz (81.7 kg)  08/22/21 165 lb (74.8 kg)  07/15/21 176 lb 9.6 oz (80.1 kg)     GEN: Well nourished, well developed, in no acute distress. HEENT: normal. Neck: Supple, no JVD, carotid bruits, or masses. Cardiac: RRR, no murmurs, rubs, or gallops. No clubbing, cyanosis, edema.   Radials/PT 2+ and equal bilaterally.  Respiratory:  Respirations regular and unlabored, clear to auscultation bilaterally. GI: Soft, nontender, nondistended. MS: No deformity or atrophy. Skin: Warm and dry, no rash. Neuro:  Strength and sensation are intact. Psych: Normal affect.  Assessment & Plan    Coronary artery disease -no issues with chest pain -continue current medication regimen  Chronic systolic heart failure -Euvolemic on exam today -Continue GDMT: Lipitor 80 mg daily, Plavix 75 mg daily, metoprolol succinate 25 mg daily, nitroglycerin 0.4 mg as needed for chest pain, and Entresto 49-51 mg twice daily  Hyperlipidemia LDL goal less than 70 -Continue Lipitor 80 mg daily -We are obtaining labs from his primary -Most recent LDL 75 06-2020.  Triglycerides 102, HDL 45, total cholesterol 139  Hypertension -well controlled today in the clinic -continue current medication regimen  Disposition: Follow up 1 year with Sinclair Grooms, MD or APP.  Signed, Elgie Collard, PA-C 07/18/2022, 9:20 AM [facility]

## 2022-07-18 ENCOUNTER — Encounter: Payer: Self-pay | Admitting: Physician Assistant

## 2022-07-18 ENCOUNTER — Ambulatory Visit: Payer: Medicare Other | Attending: Physician Assistant | Admitting: Physician Assistant

## 2022-07-18 VITALS — BP 116/70 | HR 63 | Ht 67.0 in | Wt 180.2 lb

## 2022-07-18 DIAGNOSIS — I5022 Chronic systolic (congestive) heart failure: Secondary | ICD-10-CM | POA: Diagnosis present

## 2022-07-18 DIAGNOSIS — I10 Essential (primary) hypertension: Secondary | ICD-10-CM | POA: Diagnosis present

## 2022-07-18 DIAGNOSIS — I25118 Atherosclerotic heart disease of native coronary artery with other forms of angina pectoris: Secondary | ICD-10-CM | POA: Insufficient documentation

## 2022-07-18 DIAGNOSIS — E785 Hyperlipidemia, unspecified: Secondary | ICD-10-CM | POA: Insufficient documentation

## 2022-07-18 MED ORDER — CLOPIDOGREL BISULFATE 75 MG PO TABS: 75.0000 mg | ORAL_TABLET | Freq: Every day | ORAL | 3 refills | Status: DC

## 2022-07-18 NOTE — Patient Instructions (Signed)
Medication Instructions:  Your physician recommends that you continue on your current medications as directed. Please refer to the Current Medication list given to you today.  *If you need a refill on your cardiac medications before your next appointment, please call your pharmacy*   Lab Work: None If you have labs (blood work) drawn today and your tests are completely normal, you will receive your results only by: [place] (if you have MyChart) OR A paper copy in the mail If you have any lab test that is abnormal or we need to change your treatment, we will call you to review the results.   Testing/Procedures: Your physician has requested that you have an echocardiogram. Echocardiography is a painless test that uses sound waves to create images of your heart. It provides your doctor with information about the size and shape of your heart and how well your heart's chambers and valves are working. This procedure takes approximately one hour. There are no restrictions for this procedure.    Follow-Up: At [facility], you and your health needs are our priority.  As part of our continuing mission to provide you with exceptional heart care, we have created designated Provider Care Teams.  These Care Teams include your primary Cardiologist (physician) and Advanced Practice Providers (APPs -  Physician Assistants and Nurse Practitioners) who all work together to provide you with the care you need, when you need it.   Your next appointment:   1 year(s)  The format for your next appointment:   In Person  Provider:   Sinclair Grooms, MD  or Nicholes Rough, PA-C        Important Information About Sugar

## 2022-07-28 ENCOUNTER — Ambulatory Visit (HOSPITAL_COMMUNITY): Payer: Medicare Other | Attending: Physician Assistant

## 2022-07-28 DIAGNOSIS — I5022 Chronic systolic (congestive) heart failure: Secondary | ICD-10-CM | POA: Diagnosis present

## 2022-07-28 LAB — ECHOCARDIOGRAM COMPLETE
Area-P 1/2: 2.45 cm2
S' Lateral: 2.9 cm

## 2022-07-28 MED ORDER — PERFLUTREN LIPID MICROSPHERE
1.0000 mL | INTRAVENOUS | Status: AC | PRN
  Administered 2022-07-28: 2 mL via INTRAVENOUS

## 2023-01-04 ENCOUNTER — Ambulatory Visit
Admission: RE | Admit: 2023-01-04 | Discharge: 2023-01-04 | Disposition: A | Discharge status: Home / Self Care | Payer: Medicare Other | Source: Ambulatory Visit | Attending: Family Medicine | Admitting: Family Medicine

## 2023-01-04 ENCOUNTER — Other Ambulatory Visit: Payer: Self-pay | Admitting: Family Medicine

## 2023-01-04 DIAGNOSIS — R059 Cough, unspecified: Secondary | ICD-10-CM

## 2023-01-04 DIAGNOSIS — M545 Low back pain, unspecified: Secondary | ICD-10-CM

## 2023-02-07 ENCOUNTER — Other Ambulatory Visit: Payer: Self-pay | Admitting: *Deleted

## 2023-02-07 MED ORDER — METOPROLOL SUCCINATE ER 25 MG PO TB24: 25.0000 mg | ORAL_TABLET | Freq: Every day | ORAL | 1 refills | Status: DC

## 2023-03-02 ENCOUNTER — Telehealth: Payer: Self-pay | Admitting: Cardiology

## 2023-03-02 DIAGNOSIS — I5022 Chronic systolic (congestive) heart failure: Secondary | ICD-10-CM

## 2023-03-02 NOTE — Telephone Encounter (Signed)
Prior pt of HS who has appt in October to establish with Dr Anne Fu.  Had stable echo last year and wants to know if he needs another before seeing Dr Anne Fu.  Will sent to him for review.

## 2023-03-02 NOTE — Telephone Encounter (Signed)
  Patient is scheduled for 1 year follow up in October. He has asked if he should have another Echo done since he had one done last year. Please advise.

## 2023-03-03 NOTE — Telephone Encounter (Signed)
ECHO please Donato Schultz, MD

## 2023-05-01 ENCOUNTER — Other Ambulatory Visit: Payer: Self-pay

## 2023-05-01 MED ORDER — SACUBITRIL-VALSARTAN 49-51 MG PO TABS: 1.0000 | ORAL_TABLET | Freq: Two times a day (BID) | ORAL | 3 refills | Status: DC

## 2023-05-04 NOTE — Telephone Encounter (Signed)
Pt advised that Dr Anne Fu would like the pt to have an Echo prior to his appt with him in Oct 3... not sure if the pt needs it to be a full year for insurance purposes since his last Echo was 07/28/2022.. will forward to the [facility] to call the pt and set all up for him.   Order for Echo previously placed.

## 2023-07-25 ENCOUNTER — Ambulatory Visit (HOSPITAL_COMMUNITY): Payer: Medicare Other | Attending: Cardiology

## 2023-07-25 DIAGNOSIS — I5022 Chronic systolic (congestive) heart failure: Secondary | ICD-10-CM | POA: Insufficient documentation

## 2023-07-25 LAB — ECHOCARDIOGRAM COMPLETE
Area-P 1/2: 2.67 cm2
Calc EF: 58.3 %
MV M vel: 2.68 m/s
MV Peak grad: 28.7 mm[Hg]
MV VTI: 2.68 cm2
S' Lateral: 3.3 cm
Single Plane A2C EF: 57.1 %
Single Plane A4C EF: 56.8 %

## 2023-07-25 NOTE — Progress Notes (Signed)
Patient ID: Martin Mcdowell, male   DOB: June 08, 1951, 72 y.o.   MRN: [id number]  2D Echocardiogram has been performed.   Milda Smart, RDCS.

## 2023-07-27 ENCOUNTER — Ambulatory Visit: Payer: Medicare Other | Attending: Cardiology | Admitting: Cardiology

## 2023-07-27 ENCOUNTER — Encounter: Payer: Self-pay | Admitting: Cardiology

## 2023-07-27 VITALS — BP 98/56 | HR 60 | Ht 67.0 in | Wt 171.6 lb

## 2023-07-27 DIAGNOSIS — I25118 Atherosclerotic heart disease of native coronary artery with other forms of angina pectoris: Secondary | ICD-10-CM | POA: Insufficient documentation

## 2023-07-27 DIAGNOSIS — I10 Essential (primary) hypertension: Secondary | ICD-10-CM | POA: Diagnosis present

## 2023-07-27 DIAGNOSIS — E785 Hyperlipidemia, unspecified: Secondary | ICD-10-CM | POA: Diagnosis present

## 2023-07-27 DIAGNOSIS — I5022 Chronic systolic (congestive) heart failure: Secondary | ICD-10-CM | POA: Insufficient documentation

## 2023-07-27 NOTE — Patient Instructions (Signed)

## 2023-07-27 NOTE — Progress Notes (Signed)
Cardiology Office Note:  .   Date:  07/27/2023  ID:  Martin Mcdowell, DOB 11/25/1950, MRN [id number] PCP: Ellyn Hack, MD  [facility] Providers Cardiologist:  Donato Schultz, MD     History of Present Illness: Martin Mcdowell is a 72 y.o. male Discussed with the use of AI scribe software   History of Present Illness   A 72 year old patient with a history of chronic systolic heart failure, hyperlipidemia, and coronary artery disease, presented for a follow-up visit. The patient's ejection fraction was reported to be 40-45% as per the echocardiogram conducted in May 2022. The patient's heart rhythm was sinus at 63 beats per minute. The patient's heart failure is managed with metoprolol succinate 25mg  daily and Entresto 49/51 twice daily.  The patient's hyperlipidemia is managed with atorvastatin 80mg  daily. However, the patient developed a rash after trying Crestor, which is slowly improving after a consultation with a dermatologist and reverting back to atorvastatin.  The patient has a history of coronary artery disease and had a stent placed in the LAD during a STEMI, which was found to be widely patent during a cardiac catheterization in July 2021. At the same time, stents were also placed in the proximal to mid RCA and distal circumflex.  The patient was unable to use SGLT2 inhibitor or MRA therapy like spironolactone group due to a blood pressure of 98/56. The patient also has erectile dysfunction and has Viagra listed on his medication list.  The patient occasionally experiences atypical chest tightness and had some leg discomfort after being physically active in the morning.            Studies Reviewed: Marland Kitchen   EKG Interpretation Date/Time:  Thursday July 27 2023 13:22:00 EDT Ventricular Rate:  60 PR Interval:  212 QRS Duration:  82 QT Interval:  410 QTC Calculation: 410 R Axis:   -27  Text Interpretation: Sinus rhythm with 1st degree A-V block Low voltage QRS  Septal infarct (cited on or before 27-Jul-2023) When compared with ECG of 22-Aug-2021 05:15, Premature ventricular complexes are no longer Present PR interval has increased Vent. rate has decreased BY  48 BPM QRS axis Shifted left Questionable change in initial forces of Anteroseptal leads Confirmed by Donato Schultz ([id number]) on 07/27/2023 1:41:33 PM    Results DIAGNOSTIC Echocardiogram: Ejection fraction of 40-45% (02/2021) EKG: Sinus rhythm 63 bpm Cardiac catheterization: Widely patent LAD stent, proximal to mid RCA stent placed, distal circumflex stent placed (05/18/2020)  Risk Assessment/Calculations:            Physical Exam:   VS:  BP (!) 98/56   Pulse 60   Ht 5\' 7"  (1.702 m)   Wt 171 lb 9.6 oz (77.8 kg)   SpO2 96%   BMI 26.88 kg/m    Wt Readings from Last 3 Encounters:  07/27/23 171 lb 9.6 oz (77.8 kg)  07/18/22 180 lb 3.2 oz (81.7 kg)  08/22/21 165 lb (74.8 kg)    GEN: Well nourished, well developed in no acute distress NECK: No JVD; No carotid bruits CARDIAC: RRR, no murmurs, no rubs, no gallops RESPIRATORY:  Clear to auscultation without rales, wheezing or rhonchi  ABDOMEN: Soft, non-tender, non-distended EXTREMITIES:  No edema; No deformity   ASSESSMENT AND PLAN: .       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 Currently on Metoprolol succinate 25mg  daily and Entresto 49/51mg  twice daily. Blood pressure today 98/56, limiting further addition of goal-directed medical therapy  agents. -Continue current medications.  Hyperlipidemia Currently on Atorvastatin 80mg  daily. Developed rash with Crestor, now improving on Atorvastatin. -Continue Atorvastatin 80mg  daily.  Erectile Dysfunction Currently on Viagra. Discussed the function of this medication and potential consultation with a urologist. -Continue Viagra as needed. -Consider urology consultation if necessary.  Coronary Artery Disease History of stent placements in LAD, RCA, and distal  circumflex. Last catheterization on 05/18/2020 showed patent stents. -No changes to current management.  Atypical Chest Tightness Reports occasional symptoms. No significant difficulty with physical activity, only some leg discomfort. -Monitor symptoms.             Dispo: 1 yr  Signed, Donato Schultz, MD

## 2023-08-22 ENCOUNTER — Other Ambulatory Visit: Payer: Self-pay | Admitting: Cardiology

## 2024-03-05 ENCOUNTER — Other Ambulatory Visit: Payer: Self-pay | Admitting: Physician Assistant

## 2024-03-05 MED ORDER — CLOPIDOGREL BISULFATE 75 MG PO TABS: 75.0000 mg | ORAL_TABLET | Freq: Every day | ORAL | 2 refills | Status: DC

## 2024-03-05 NOTE — Addendum Note (Signed)
 Addended by: Beyounce Dickens on: 03/05/2024 09:50 AM   Modules accepted: Orders

## 2024-07-29 ENCOUNTER — Ambulatory Visit: Admitting: Cardiology

## 2024-09-05 ENCOUNTER — Ambulatory Visit (INDEPENDENT_AMBULATORY_CARE_PROVIDER_SITE_OTHER): Admitting: Cardiology

## 2024-09-05 ENCOUNTER — Encounter (HOSPITAL_BASED_OUTPATIENT_CLINIC_OR_DEPARTMENT_OTHER): Payer: Self-pay | Admitting: Cardiology

## 2024-09-05 VITALS — BP 118/80 | HR 69 | Ht 67.0 in | Wt 174.4 lb

## 2024-09-05 DIAGNOSIS — I25118 Atherosclerotic heart disease of native coronary artery with other forms of angina pectoris: Secondary | ICD-10-CM | POA: Diagnosis not present

## 2024-09-05 DIAGNOSIS — E785 Hyperlipidemia, unspecified: Secondary | ICD-10-CM

## 2024-09-05 DIAGNOSIS — I10 Essential (primary) hypertension: Secondary | ICD-10-CM

## 2024-09-05 NOTE — Patient Instructions (Signed)
 Medication Instructions:  No changes *If you need a refill on your cardiac medications before your next appointment, please call your pharmacy*  Lab Work: none If you have labs (blood work) drawn today and your tests are completely normal, you will receive your results only by: MyChart Message (if you have MyChart) OR A paper copy in the mail If you have any lab test that is abnormal or we need to change your treatment, we will call you to review the results.  Testing/Procedures: none  Follow-Up: At [facility], you and your health needs are our priority.  As part of our continuing mission to provide you with exceptional heart care, our providers are all part of one team.  This team includes your primary Cardiologist (physician) and Advanced Practice Providers or APPs (Physician Assistants and Nurse Practitioners) who all work together to provide you with the care you need, when you need it.  Your next appointment:   12 month(s)  Provider:   Oneil Parchment, MD, Rosaline Bane, NP, or Reche Finder, NP

## 2024-09-05 NOTE — Progress Notes (Signed)
 Cardiology Office Note:  .   Date:  09/05/2024  ID:  Martin Mcdowell, DOB 18-Apr-1951, MRN [id number] PCP: Maree Leni Edyth DELENA, MD  [facility] Providers Cardiologist:  Oneil Parchment, MD    History of Present Illness: Martin Mcdowell is a 73 y.o. male Discussed the use of AI scribe software  History of Present Illness Martin Mcdowell is a 73 year old male with coronary artery disease, hypertension, and chronic systolic heart failure who presents for follow-up.  He has a history of coronary artery disease with ischemic cardiomyopathy, having undergone a left anterior descending (LAD) stent placement during a STEMI in July 2021. Additional stents were placed in the proximal and mid right coronary artery (RCA) and distal circumflex. His ejection fraction improved from 35% to 40-45% as of May 2022.  He experiences occasional chest pains lasting about half an hour, occurring months apart, which subside with deep breathing. He denies any major chest pain, discomfort, or unusual symptoms. He remains active, performing strenuous activities like using a log splitter and lifting heavy objects without experiencing shortness of breath or internal discomfort.  His current medications include atorvastatin  80 mg for hyperlipidemia, Plavix  75 mg for coronary disease, Toprol  XL 25 mg for heart failure, and Entresto  49/51 mg twice a day. He occasionally misses doses, particularly taking medications meant for twice daily only once a day.  He engages in physical activities such as yoga, weight lifting, and walking to strengthen his heart. He has not been consuming heavy, greasy foods, and his LDL was recently 75.      Studies Reviewed: SABRA   EKG Interpretation Date/Time:  Thursday September 05 2024 13:45:15 EST Ventricular Rate:  62 PR Interval:  182 QRS Duration:  88 QT Interval:  404 QTC Calculation: 410 R Axis:   -22  Text Interpretation: Normal sinus rhythm with sinus arrhythmia Low voltage QRS Septal  infarct (cited on or before 27-Jul-2023) T wave abnormality, consider anterior ischemia When compared with ECG of 27-Jul-2023 13:22, No significant change since last tracing Confirmed by Parchment Oneil ([id number]) on 09/05/2024 1:55:40 PM    Results LABS LDL: 75  DIAGNOSTIC Echocardiogram: Ejection fraction 40-45% ECG: Stable Risk Assessment/Calculations:            Physical Exam:   VS:  BP 118/80   Pulse 69   Ht 5' 7 (1.702 m)   Wt 174 lb 6.4 oz (79.1 kg)   SpO2 99%   BMI 27.31 kg/m    Wt Readings from Last 3 Encounters:  09/05/24 174 lb 6.4 oz (79.1 kg)  07/27/23 171 lb 9.6 oz (77.8 kg)  07/18/22 180 lb 3.2 oz (81.7 kg)    GEN: Well nourished, well developed in no acute distress NECK: No JVD; No carotid bruits CARDIAC: RRR, no murmurs, no rubs, no gallops RESPIRATORY:  Clear to auscultation without rales, wheezing or rhonchi  ABDOMEN: Soft, non-tender, non-distended EXTREMITIES:  No edema; No deformity   ASSESSMENT AND PLAN: .    Assessment and Plan Assessment & Plan Coronary artery disease with prior stent placement Coronary artery disease with stents placed in the LAD, proximal and mid RCA, and distal circumflex during a STEMI in July 2021. No major chest pain or discomfort reported. Occasional chest pain lasting about half an hour, not constant or predictable. EKG is stable. Engaging in regular physical activity including yoga and weight lifting. Non-adherence to medication regimen, particularly with clopidogrel , noted. Clopidogrel  is effective for days, not just eight hours. -  Continue current medication regimen including Plavix  (clopidogrel ) 75 mg daily. - Encouraged adherence to medication regimen, especially clopidogrel . - Advised to report any worsening or more frequent chest pain for potential further investigation, including stress test or heart catheterization.  Chronic systolic heart failure with reduced ejection fraction secondary to ischemic  cardiomyopathy Chronic systolic heart failure with ejection fraction improved from 35% to 45%. Engaging in regular physical activity including yoga and weight lifting. No significant shortness of breath or other concerning symptoms reported during strenuous activities. - Continue current medication regimen including Entresto  49/51 mg twice daily and Metoprolol  succinate 25 mg daily. - Encouraged continuation of regular physical activity.  Hyperlipidemia Managed with atorvastatin  80 mg daily. LDL level is 75, close to the target of less than 70. No recent consumption of heavy, greasy foods reported. - Continue atorvastatin  80 mg daily.  Essential hypertension Managed with current medication regimen.  Atypical chest pain Intermittent atypical chest pain lasting about half an hour, not constant or predictable. No significant changes in frequency or severity reported. Engaging in regular physical activity without exacerbation of symptoms. - Advised to report any changes in chest pain for potential further investigation.         Dispo: 1 yr  Signed, Oneil Parchment, MD

## 2024-09-11 ENCOUNTER — Other Ambulatory Visit: Payer: Self-pay

## 2024-09-13 MED ORDER — METOPROLOL SUCCINATE ER 25 MG PO TB24: 25.0000 mg | ORAL_TABLET | Freq: Every day | ORAL | 3 refills | Status: AC

## 2024-11-20 ENCOUNTER — Other Ambulatory Visit: Payer: Self-pay | Admitting: Physician Assistant
